# Patient Record
Sex: Male | Born: 1950 | Race: Black or African American | Hispanic: No | Smoking: Former smoker
Health system: Southern US, Community
[De-identification: ages and names within clinical notes are randomized; demographics above are authoritative.]

## PROBLEM LIST (undated history)

## (undated) DIAGNOSIS — I4891 Unspecified atrial fibrillation: Secondary | ICD-10-CM

## (undated) DIAGNOSIS — I639 Cerebral infarction, unspecified: Secondary | ICD-10-CM

## (undated) DIAGNOSIS — I10 Essential (primary) hypertension: Secondary | ICD-10-CM

## (undated) DIAGNOSIS — I2510 Atherosclerotic heart disease of native coronary artery without angina pectoris: Secondary | ICD-10-CM

## (undated) DIAGNOSIS — I5022 Chronic systolic (congestive) heart failure: Secondary | ICD-10-CM

## (undated) DIAGNOSIS — I509 Heart failure, unspecified: Secondary | ICD-10-CM

## (undated) DIAGNOSIS — R12 Heartburn: Secondary | ICD-10-CM

## (undated) DIAGNOSIS — F1910 Other psychoactive substance abuse, uncomplicated: Secondary | ICD-10-CM

## (undated) DIAGNOSIS — J449 Chronic obstructive pulmonary disease, unspecified: Secondary | ICD-10-CM

## (undated) HISTORY — PX: JOINT REPLACEMENT: SHX530

---

## 1999-01-07 ENCOUNTER — Encounter: Payer: Self-pay | Admitting: Emergency Medicine

## 1999-01-07 ENCOUNTER — Inpatient Hospital Stay (HOSPITAL_COMMUNITY): Admission: EM | Admit: 1999-01-07 | Discharge: 1999-01-09 | Payer: Self-pay

## 1999-01-08 ENCOUNTER — Encounter: Payer: Self-pay | Admitting: Surgery

## 1999-07-21 ENCOUNTER — Emergency Department (HOSPITAL_COMMUNITY): Admission: EM | Admit: 1999-07-21 | Discharge: 1999-07-21 | Payer: Self-pay | Admitting: Emergency Medicine

## 1999-07-21 ENCOUNTER — Encounter: Payer: Self-pay | Admitting: Emergency Medicine

## 1999-11-07 ENCOUNTER — Encounter: Payer: Self-pay | Admitting: Emergency Medicine

## 1999-11-07 ENCOUNTER — Inpatient Hospital Stay (HOSPITAL_COMMUNITY): Admission: EM | Admit: 1999-11-07 | Discharge: 1999-11-11 | Payer: Self-pay | Admitting: Emergency Medicine

## 1999-11-07 ENCOUNTER — Encounter: Payer: Self-pay | Admitting: Orthopaedic Surgery

## 2003-05-19 ENCOUNTER — Ambulatory Visit (HOSPITAL_COMMUNITY): Admission: RE | Admit: 2003-05-19 | Discharge: 2003-05-19 | Payer: Self-pay | Admitting: Internal Medicine

## 2007-08-21 ENCOUNTER — Inpatient Hospital Stay (HOSPITAL_COMMUNITY): Admission: EM | Admit: 2007-08-21 | Discharge: 2007-08-24 | Payer: Self-pay | Admitting: Emergency Medicine

## 2007-08-22 ENCOUNTER — Encounter (INDEPENDENT_AMBULATORY_CARE_PROVIDER_SITE_OTHER): Payer: Self-pay | Admitting: Internal Medicine

## 2007-08-24 ENCOUNTER — Other Ambulatory Visit: Payer: Self-pay | Admitting: Cardiovascular Disease

## 2008-06-24 ENCOUNTER — Inpatient Hospital Stay (HOSPITAL_COMMUNITY): Admission: EM | Admit: 2008-06-24 | Discharge: 2008-06-26 | Payer: Self-pay | Admitting: Emergency Medicine

## 2008-06-24 ENCOUNTER — Ambulatory Visit: Payer: Self-pay | Admitting: Family Medicine

## 2009-02-06 ENCOUNTER — Inpatient Hospital Stay (HOSPITAL_COMMUNITY): Admission: EM | Admit: 2009-02-06 | Discharge: 2009-02-14 | Payer: Self-pay | Admitting: Emergency Medicine

## 2009-02-06 ENCOUNTER — Ambulatory Visit: Payer: Self-pay | Admitting: Cardiology

## 2009-02-06 ENCOUNTER — Ambulatory Visit: Payer: Self-pay | Admitting: Internal Medicine

## 2009-02-07 ENCOUNTER — Encounter (INDEPENDENT_AMBULATORY_CARE_PROVIDER_SITE_OTHER): Payer: Self-pay | Admitting: Internal Medicine

## 2009-02-11 ENCOUNTER — Encounter: Payer: Self-pay | Admitting: Internal Medicine

## 2009-02-20 ENCOUNTER — Encounter: Payer: Self-pay | Admitting: Cardiology

## 2009-03-13 ENCOUNTER — Inpatient Hospital Stay (HOSPITAL_COMMUNITY)
Admission: EM | Admit: 2009-03-13 | Discharge: 2009-03-17 | Payer: Self-pay | Admitting: Physical Medicine and Rehabilitation

## 2009-03-13 ENCOUNTER — Ambulatory Visit: Payer: Self-pay | Admitting: Pulmonary Disease

## 2009-03-13 ENCOUNTER — Ambulatory Visit: Payer: Self-pay | Admitting: Internal Medicine

## 2009-03-14 ENCOUNTER — Encounter: Payer: Self-pay | Admitting: Internal Medicine

## 2009-03-28 ENCOUNTER — Encounter: Payer: Self-pay | Admitting: Cardiology

## 2010-03-05 ENCOUNTER — Ambulatory Visit: Payer: Self-pay | Admitting: Emergency Medicine

## 2010-03-06 ENCOUNTER — Ambulatory Visit: Payer: Self-pay | Admitting: Internal Medicine

## 2010-03-07 ENCOUNTER — Encounter (INDEPENDENT_AMBULATORY_CARE_PROVIDER_SITE_OTHER): Payer: Self-pay | Admitting: Pulmonary Disease

## 2010-03-25 DIAGNOSIS — I2510 Atherosclerotic heart disease of native coronary artery without angina pectoris: Secondary | ICD-10-CM | POA: Insufficient documentation

## 2010-03-25 DIAGNOSIS — I10 Essential (primary) hypertension: Secondary | ICD-10-CM | POA: Insufficient documentation

## 2010-03-25 DIAGNOSIS — F1910 Other psychoactive substance abuse, uncomplicated: Secondary | ICD-10-CM

## 2010-04-17 ENCOUNTER — Inpatient Hospital Stay (HOSPITAL_COMMUNITY): Admission: EM | Admit: 2010-04-17 | Discharge: 2010-03-10 | Payer: Self-pay | Admitting: Emergency Medicine

## 2010-05-18 ENCOUNTER — Inpatient Hospital Stay (HOSPITAL_COMMUNITY)
Admission: EM | Admit: 2010-05-18 | Discharge: 2010-05-24 | Payer: Self-pay | Source: Home / Self Care | Attending: Internal Medicine | Admitting: Internal Medicine

## 2010-05-19 ENCOUNTER — Encounter (INDEPENDENT_AMBULATORY_CARE_PROVIDER_SITE_OTHER): Payer: Self-pay | Admitting: Internal Medicine

## 2010-05-26 LAB — DIFFERENTIAL
Basophils Absolute: 0.1 10*3/uL (ref 0.0–0.1)
Basophils Relative: 1 % (ref 0–1)
Eosinophils Absolute: 0.1 10*3/uL (ref 0.0–0.7)
Eosinophils Relative: 1 % (ref 0–5)
Lymphocytes Relative: 57 % — ABNORMAL HIGH (ref 12–46)
Lymphs Abs: 6.8 10*3/uL — ABNORMAL HIGH (ref 0.7–4.0)
Monocytes Absolute: 0.8 10*3/uL (ref 0.1–1.0)
Monocytes Relative: 7 % (ref 3–12)
Neutro Abs: 4 10*3/uL (ref 1.7–7.7)
Neutrophils Relative %: 34 % — ABNORMAL LOW (ref 43–77)

## 2010-05-26 LAB — CBC
HCT: 46.6 % (ref 39.0–52.0)
HCT: 40.7 % (ref 39.0–52.0)
HCT: 41.7 % (ref 39.0–52.0)
HCT: 41.4 % (ref 39.0–52.0)
HCT: 43.4 % (ref 39.0–52.0)
Hemoglobin: 15.2 g/dL (ref 13.0–17.0)
Hemoglobin: 13.2 g/dL (ref 13.0–17.0)
Hemoglobin: 13.8 g/dL (ref 13.0–17.0)
Hemoglobin: 13.7 g/dL (ref 13.0–17.0)
Hemoglobin: 14.1 g/dL (ref 13.0–17.0)
MCH: 28.5 pg (ref 26.0–34.0)
MCH: 27.3 pg (ref 26.0–34.0)
MCH: 28.1 pg (ref 26.0–34.0)
MCH: 28 pg (ref 26.0–34.0)
MCH: 27.5 pg (ref 26.0–34.0)
MCHC: 32.6 g/dL (ref 30.0–36.0)
MCHC: 32.4 g/dL (ref 30.0–36.0)
MCHC: 33.1 g/dL (ref 30.0–36.0)
MCHC: 33.1 g/dL (ref 30.0–36.0)
MCHC: 32.5 g/dL (ref 30.0–36.0)
MCV: 87.4 fL (ref 78.0–100.0)
MCV: 84.3 fL (ref 78.0–100.0)
MCV: 84.9 fL (ref 78.0–100.0)
MCV: 84.7 fL (ref 78.0–100.0)
MCV: 84.6 fL (ref 78.0–100.0)
Platelets: 252 10*3/uL (ref 150–400)
Platelets: 220 10*3/uL (ref 150–400)
Platelets: 217 10*3/uL (ref 150–400)
Platelets: 225 10*3/uL (ref 150–400)
Platelets: 226 10*3/uL (ref 150–400)
RBC: 5.33 MIL/uL (ref 4.22–5.81)
RBC: 4.83 MIL/uL (ref 4.22–5.81)
RBC: 4.91 MIL/uL (ref 4.22–5.81)
RBC: 4.89 MIL/uL (ref 4.22–5.81)
RBC: 5.13 MIL/uL (ref 4.22–5.81)
RDW: 14.9 % (ref 11.5–15.5)
RDW: 14.6 % (ref 11.5–15.5)
RDW: 15.1 % (ref 11.5–15.5)
RDW: 14.9 % (ref 11.5–15.5)
RDW: 14.9 % (ref 11.5–15.5)
WBC: 11.8 10*3/uL — ABNORMAL HIGH (ref 4.0–10.5)
WBC: 8.9 10*3/uL (ref 4.0–10.5)
WBC: 6.4 10*3/uL (ref 4.0–10.5)
WBC: 5.8 10*3/uL (ref 4.0–10.5)
WBC: 4.6 10*3/uL (ref 4.0–10.5)

## 2010-05-26 LAB — ALDOSTERONE: Aldosterone, Serum: 13 ng/dL

## 2010-05-26 LAB — RENAL FUNCTION PANEL
Albumin: 3.2 g/dL — ABNORMAL LOW (ref 3.5–5.2)
BUN: 17 mg/dL (ref 6–23)
CO2: 27 mEq/L (ref 19–32)
Calcium: 9 mg/dL (ref 8.4–10.5)
Chloride: 104 mEq/L (ref 96–112)
Creatinine, Ser: 1.22 mg/dL (ref 0.4–1.5)
GFR calc Af Amer: >60 mL/min (ref >60)
GFR calc non Af Amer: >60 mL/min (ref >60)
Glucose, Bld: 90 mg/dL (ref 70–99)
Phosphorus: 3.9 mg/dL (ref 2.3–4.6)
Potassium: 3.9 mEq/L (ref 3.5–5.1)
Sodium: 139 mEq/L (ref 135–145)

## 2010-05-26 LAB — GLUCOSE, CAPILLARY
Glucose-Capillary: 101 mg/dL — ABNORMAL HIGH (ref 70–99)
Glucose-Capillary: 170 mg/dL — ABNORMAL HIGH (ref 70–99)
Glucose-Capillary: 119 mg/dL — ABNORMAL HIGH (ref 70–99)
Glucose-Capillary: 126 mg/dL — ABNORMAL HIGH (ref 70–99)
Glucose-Capillary: 101 mg/dL — ABNORMAL HIGH (ref 70–99)
Glucose-Capillary: 119 mg/dL — ABNORMAL HIGH (ref 70–99)
Glucose-Capillary: 129 mg/dL — ABNORMAL HIGH (ref 70–99)
Glucose-Capillary: 100 mg/dL — ABNORMAL HIGH (ref 70–99)
Glucose-Capillary: 126 mg/dL — ABNORMAL HIGH (ref 70–99)
Glucose-Capillary: 99 mg/dL (ref 70–99)
Glucose-Capillary: 89 mg/dL (ref 70–99)
Glucose-Capillary: 113 mg/dL — ABNORMAL HIGH (ref 70–99)
Glucose-Capillary: 91 mg/dL (ref 70–99)
Glucose-Capillary: 116 mg/dL — ABNORMAL HIGH (ref 70–99)
Glucose-Capillary: 116 mg/dL — ABNORMAL HIGH (ref 70–99)
Glucose-Capillary: 99 mg/dL (ref 70–99)
Glucose-Capillary: 88 mg/dL (ref 70–99)
Glucose-Capillary: 86 mg/dL (ref 70–99)
Glucose-Capillary: 115 mg/dL — ABNORMAL HIGH (ref 70–99)
Glucose-Capillary: 89 mg/dL (ref 70–99)

## 2010-05-26 LAB — COMPREHENSIVE METABOLIC PANEL
ALT: 31 U/L (ref 0–53)
ALT: 30 U/L (ref 0–53)
AST: 43 U/L — ABNORMAL HIGH (ref 0–37)
AST: 45 U/L — ABNORMAL HIGH (ref 0–37)
Albumin: 4 g/dL (ref 3.5–5.2)
Albumin: 3.1 g/dL — ABNORMAL LOW (ref 3.5–5.2)
Alkaline Phosphatase: 120 U/L — ABNORMAL HIGH (ref 39–117)
Alkaline Phosphatase: 77 U/L (ref 39–117)
BUN: 11 mg/dL (ref 6–23)
BUN: 13 mg/dL (ref 6–23)
CO2: 22 mEq/L (ref 19–32)
CO2: 25 mEq/L (ref 19–32)
Calcium: 9.2 mg/dL (ref 8.4–10.5)
Calcium: 8.8 mg/dL (ref 8.4–10.5)
Chloride: 104 mEq/L (ref 96–112)
Chloride: 107 mEq/L (ref 96–112)
Creatinine, Ser: 1.24 mg/dL (ref 0.4–1.5)
Creatinine, Ser: 1.06 mg/dL (ref 0.4–1.5)
GFR calc Af Amer: >60 mL/min (ref >60)
GFR calc Af Amer: >60 mL/min (ref >60)
GFR calc non Af Amer: >60 mL/min (ref >60)
GFR calc non Af Amer: >60 mL/min (ref >60)
Glucose, Bld: 213 mg/dL — ABNORMAL HIGH (ref 70–99)
Glucose, Bld: 99 mg/dL (ref 70–99)
Potassium: 3 mEq/L — ABNORMAL LOW (ref 3.5–5.1)
Potassium: 4.6 mEq/L (ref 3.5–5.1)
Sodium: 140 mEq/L (ref 135–145)
Sodium: 140 mEq/L (ref 135–145)
Total Bilirubin: 0.3 mg/dL (ref 0.3–1.2)
Total Bilirubin: 0.3 mg/dL (ref 0.3–1.2)
Total Protein: 7.2 g/dL (ref 6.0–8.3)
Total Protein: 6.2 g/dL (ref 6.0–8.3)

## 2010-05-26 LAB — BASIC METABOLIC PANEL
BUN: 15 mg/dL (ref 6–23)
BUN: 15 mg/dL (ref 6–23)
BUN: 15 mg/dL (ref 6–23)
CO2: 26 mEq/L (ref 19–32)
CO2: 25 mEq/L (ref 19–32)
CO2: 24 mEq/L (ref 19–32)
Calcium: 8.9 mg/dL (ref 8.4–10.5)
Calcium: 8.9 mg/dL (ref 8.4–10.5)
Calcium: 9.2 mg/dL (ref 8.4–10.5)
Chloride: 106 mEq/L (ref 96–112)
Chloride: 107 mEq/L (ref 96–112)
Chloride: 105 mEq/L (ref 96–112)
Creatinine, Ser: 1.12 mg/dL (ref 0.4–1.5)
Creatinine, Ser: 1.09 mg/dL (ref 0.4–1.5)
Creatinine, Ser: 1.06 mg/dL (ref 0.4–1.5)
GFR calc Af Amer: >60 mL/min (ref >60)
GFR calc Af Amer: >60 mL/min (ref >60)
GFR calc Af Amer: >60 mL/min (ref >60)
GFR calc non Af Amer: >60 mL/min (ref >60)
GFR calc non Af Amer: >60 mL/min (ref >60)
GFR calc non Af Amer: >60 mL/min (ref >60)
Glucose, Bld: 100 mg/dL — ABNORMAL HIGH (ref 70–99)
Glucose, Bld: 87 mg/dL (ref 70–99)
Glucose, Bld: 93 mg/dL (ref 70–99)
Potassium: 4 mEq/L (ref 3.5–5.1)
Potassium: 4.5 mEq/L (ref 3.5–5.1)
Potassium: 4.6 mEq/L (ref 3.5–5.1)
Sodium: 139 mEq/L (ref 135–145)
Sodium: 139 mEq/L (ref 135–145)
Sodium: 137 mEq/L (ref 135–145)

## 2010-05-26 LAB — POCT I-STAT 3, ART BLOOD GAS (G3+)
Acid-base deficit: 8 mmol/L — ABNORMAL HIGH (ref 0.0–2.0)
Acid-base deficit: 3 mmol/L — ABNORMAL HIGH (ref 0.0–2.0)
Bicarbonate: 22.4 mEq/L (ref 20.0–24.0)
Bicarbonate: 23.6 mEq/L (ref 20.0–24.0)
O2 Saturation: 95 %
O2 Saturation: 93 %
Patient temperature: 36.5
TCO2: 24 mmol/L (ref 0–100)
TCO2: 25 mmol/L (ref 0–100)
pCO2 arterial: 66.4 mmHg (ref 35.0–45.0)
pCO2 arterial: 47.2 mmHg — ABNORMAL HIGH (ref 35.0–45.0)
pH, Arterial: 7.137 — CL (ref 7.350–7.450)
pH, Arterial: 7.305 — ABNORMAL LOW (ref 7.350–7.450)
pO2, Arterial: 100 mmHg (ref 80.0–100.0)
pO2, Arterial: 72 mmHg — ABNORMAL LOW (ref 80.0–100.0)

## 2010-05-26 LAB — LIPASE, BLOOD: Lipase: 19 U/L (ref 11–59)

## 2010-05-26 LAB — CK TOTAL AND CKMB (NOT AT ARMC)
CK, MB: 5.2 ng/mL — ABNORMAL HIGH (ref 0.3–4.0)
CK, MB: 7 ng/mL (ref 0.3–4.0)
CK, MB: 7.8 ng/mL (ref 0.3–4.0)
Relative Index: 0.7 (ref 0.0–2.5)
Relative Index: 0.8 (ref 0.0–2.5)
Relative Index: 0.9 (ref 0.0–2.5)
Total CK: 1022 U/L — ABNORMAL HIGH (ref 7–232)
Total CK: 1042 U/L — ABNORMAL HIGH (ref 7–232)
Total CK: 587 U/L — ABNORMAL HIGH (ref 7–232)

## 2010-05-26 LAB — POCT I-STAT, CHEM 8
BUN: 14 mg/dL (ref 6–23)
Calcium, Ion: 1.24 mmol/L (ref 1.12–1.32)
Chloride: 105 mEq/L (ref 96–112)
Creatinine, Ser: 1.2 mg/dL (ref 0.4–1.5)
Glucose, Bld: 210 mg/dL — ABNORMAL HIGH (ref 70–99)
HCT: 51 % (ref 39.0–52.0)
Hemoglobin: 17.3 g/dL — ABNORMAL HIGH (ref 13.0–17.0)
Potassium: 2.9 mEq/L — ABNORMAL LOW (ref 3.5–5.1)
Sodium: 141 mEq/L (ref 135–145)
TCO2: 25 mmol/L (ref 0–100)

## 2010-05-26 LAB — MRSA PCR SCREENING: MRSA by PCR: NEGATIVE

## 2010-05-26 LAB — BRAIN NATRIURETIC PEPTIDE
Pro B Natriuretic peptide (BNP): 77 pg/mL (ref 0.0–100.0)
Pro B Natriuretic peptide (BNP): 269 pg/mL — ABNORMAL HIGH (ref 0.0–100.0)
Pro B Natriuretic peptide (BNP): 728 pg/mL — ABNORMAL HIGH (ref 0.0–100.0)
Pro B Natriuretic peptide (BNP): 192 pg/mL — ABNORMAL HIGH (ref 0.0–100.0)

## 2010-05-26 LAB — RAPID URINE DRUG SCREEN, HOSP PERFORMED
Amphetamines: NOT DETECTED
Barbiturates: NOT DETECTED
Benzodiazepines: NOT DETECTED
Cocaine: NOT DETECTED
Opiates: NOT DETECTED
Tetrahydrocannabinol: NOT DETECTED

## 2010-05-26 LAB — LIPID PANEL
Cholesterol: 206 mg/dL — ABNORMAL HIGH (ref 0–200)
HDL: 82 mg/dL (ref >39)
LDL Cholesterol: 108 mg/dL — ABNORMAL HIGH (ref 0–99)
Total CHOL/HDL Ratio: 2.5 RATIO
Triglycerides: 81 mg/dL (ref <150)
VLDL: 16 mg/dL (ref 0–40)

## 2010-05-26 LAB — HEPATIC FUNCTION PANEL
ALT: 33 U/L (ref 0–53)
AST: 50 U/L — ABNORMAL HIGH (ref 0–37)
Albumin: 3.5 g/dL (ref 3.5–5.2)
Alkaline Phosphatase: 87 U/L (ref 39–117)
Bilirubin, Direct: <0.1 mg/dL (ref 0.0–0.3)
Total Bilirubin: 0.3 mg/dL (ref 0.3–1.2)
Total Protein: 6.3 g/dL (ref 6.0–8.3)

## 2010-05-26 LAB — POCT CARDIAC MARKERS
CKMB, poc: 1.1 ng/mL (ref 1.0–8.0)
Myoglobin, poc: 165 ng/mL (ref 12–200)
Troponin i, poc: <0.05 ng/mL (ref 0.00–0.09)

## 2010-05-26 LAB — PROTIME-INR
INR: 0.9 (ref 0.00–1.49)
Prothrombin Time: 12.4 seconds (ref 11.6–15.2)

## 2010-05-26 LAB — ETHANOL: Alcohol, Ethyl (B): <5 mg/dL (ref 0–10)

## 2010-05-26 LAB — TSH: TSH: 1.178 u[IU]/mL (ref 0.350–4.500)

## 2010-05-26 LAB — HEMOGLOBIN A1C
Hgb A1c MFr Bld: 5.9 % — ABNORMAL HIGH (ref <5.7)
Mean Plasma Glucose: 123 mg/dL — ABNORMAL HIGH (ref <117)

## 2010-05-26 LAB — TROPONIN I
Troponin I: 0.11 ng/mL — ABNORMAL HIGH (ref 0.00–0.06)
Troponin I: 0.12 ng/mL — ABNORMAL HIGH (ref 0.00–0.06)
Troponin I: 0.17 ng/mL — ABNORMAL HIGH (ref 0.00–0.06)

## 2010-05-26 LAB — CORTISOL-AM, BLOOD: Cortisol - AM: 7.6 ug/dL (ref 4.3–22.4)

## 2010-05-26 LAB — CK: Total CK: 394 U/L — ABNORMAL HIGH (ref 7–232)

## 2010-05-26 LAB — PATHOLOGIST SMEAR REVIEW

## 2010-05-26 LAB — MAGNESIUM: Magnesium: 1.8 mg/dL (ref 1.5–2.5)

## 2010-06-02 LAB — RENIN: Renin Activity: 0.33 ng/mL/h (ref 0.25–5.82)

## 2010-06-05 NOTE — Discharge Summary (Addendum)
[name], [name]              ACCOUNT NO.:  [id number]  MEDICAL RECORD NO.:  [id number]          PATIENT TYPE:  INP  LOCATION:  2004                         FACILITY:  [facility]  PHYSICIAN:  Kela Millin, M.D.DATE OF BIRTH:  1950/10/30  DATE OF ADMISSION:  05/18/2010 DATE OF DISCHARGE:  05/24/2010                        DISCHARGE SUMMARY - REFERRING   DISCHARGE DIAGNOSES: 1. Acute on chronic systolic heart failure - ejection fraction 20-25%     per echocardiogram of May 19, 2010. 2. Nonischemic cardiomyopathy. 3. Hypertension. 4. Chronic obstructive pulmonary disease. 5. History of polysubstance abuse. 6. History of left femur fracture, status post surgical repair. 7. Noncompliance.  STUDIES/PROCEDURES: 1. Chest x-ray on January 8th:  Relatively diffuse airspace disease.     This is a multifocal infection or alveolar pulmonary edema.  2. Follow-up chest x-ray on January 9th:  Pulmonary edema. 3. Abdominal ultrasound on January 9th.  Negative abdominal     ultrasound. 4. Follow-up chest x-ray on January 10th:  Near-complete clearing of     previously demonstrated pulmonary edema. Underlying emphysematous     changes and scarring noted. 5. Left shoulder x-ray on January 10th:  Moderate acromioclavicular     degenerative disease, otherwise negative. 6. Nuclear medicine stress test on January 11th:  Findings suggestive     of ischemic cardiomyopathy with marked LV systolic dysfunction.     Ejection fraction 63%.  The left ventricle shows global hypokinesis     and a apical akinesis. 7. 2-D echocardiogram:  Ejection fraction 20-25%, diffuse hypokinesis. 8. Cardiac catheterization done on January 13th:  Diffuse moderate     nonobstructive coronary artery disease. Known severe left     ventricular dysfunction.  CONSULTATIONS:  Cardiology - [facility]/Dr. Shirlee Latch.  BRIEF HISTORY:  The patient is a 60 year old black male with the above- listed medical problems who presented  with shortness of breath.  He also complained of chest pain and some abdominal pain.  In the ED, he was found to have a blood pressure of 240/130 and was started on IV nitroglycerin, also given IV Lasix and placed on BiPAP.  His blood pressure improved to a systolic of 120 with the above intervention.  The patient reported that he had not been taking his medications for a few days, although per the admitting MD, his medication bottles were all empty and the last time he had filled them was October 31st, with no refills on those.  The patient admitted to chest pain that was pressure- like radiating to his left arm.  He also admitted to epigastric pain that was pressure-like.  He denied nausea, vomiting, diarrhea.  He admitted to a cough productive of sputum.  He was admitted for further evaluation and management.  HOSPITAL COURSE: 1. Acute on chronic systolic heart failure:  Upon admission the     patient was placed on BiPAP and diuresed with IV Lasix.  Cardiac     enzymes were cycled and mild troponin elevation of 0.017 and 0.11     were noted.  The patient was placed on IV nitroglycerin as well     given his markedly elevated blood pressure/malignant hypertension  upon admission.  Cardiology was consulted and they followed and     added Bystolic.  With diuresis his symptoms improved and he was     subsequently changed to p.o. Lasix.  A 2-D echocardiogram was done     on January 9th which revealed an ejection fraction of 20-25% with     diffuse hypokinesis and mild concentric hypertrophy was noted.  The     patient was placed on an ACE inhibitor, lisinopril as well during     his hospital stay.  The patient has continued to improve and his     shortness of breath has completely resolved. He has had no     peripheral edema.  Cardiology followed up with the patient today     and from their standpoint recommended discharge for outpatient     followup with Dr. Jens Som. 2. Diffuse  nonobstructive coronary artery disease.  As above, the     patient presented with chest pain. Cardiac enzymes were cycled and     came back with mildly elevated troponins.  The patient had a stress     test and it showed findings suggestive of ischemic cardiomyopathy     with marked LV systolic dysfunction, ejection fraction of 23%.     Cardiology followed up with the patient and a cardiac     catheterization was done on May 23, 2009 by Dr. Excell Seltzer.  This     showed diffuse moderate nonobstructive coronary artery disease, and     known severe left ventricular dysfunction.  Following this, Dr.     Earmon Phoenix impression was that the patient has severe cardiomyopathy     out of proportion to the extent of coronary artery disease.  He     recommended continuing ongoing medical therapy.  The patient was     maintained on aspirin as well as ACE inhibitor and Bystolic while     in the hospital.  He has remained chest pain-free. He will be     discharged on medical therapy as above and to follow up with Dr.     Jens Som. 3. COPD:  The patient was placed on nebulized bronchodilators as well     as antibiotics during his hospital stay and he is to complete the     antibiotics upon discharge and continue his bronchodilators, and     follow up with his PCP. 4. Malignant hypertension:  Treated with IV nitroglycerin on     admission.  The patient responded well to this as well as the     diuresis with IV Lasix.  Subsequently the nitroglycerin drip was     discontinued and his blood pressure has been maintained on the     Bystolic and  lisinopril which he is to continue upon discharge. 5. History of polysubstance abuse:  The patient had a urine drug     screen done on admission and this was negative. 6. Hyperlipidema:  The patient is to continue his Zocor upon     discharge.  DISCHARGE MEDICATIONS: 1. Bystolic 25 mg p.o. daily. 2. Combivent 2 puffs q.i.d. 3. Avelox 400 mg p.o. daily. 4.  Lisinopril 10 mg p.o. b.i.d. 5. Aspirin 325 mg p.o. daily. 6. Lasix 20 mg p.o. daily. 7. Prilosec 20 mg p.o. daily. 8. Zocor 40 mg p.o. q.h.s. 9. Spironolactone 25 mg p.o. daily.  FOLLOWUP CARE: 1. Dr. Concepcion Elk with [facility] on February 9th at 9:00 a.m. 2. Dr. Jens Som in 2 weeks, the patient  is to call for an appointment     upon discharge.  DISCHARGE CONDITION:  Improved/stable.     Kela Millin, M.D.     ACV/MEDQ  D:  05/24/2010  T:  05/24/2010  Job:  [id number]  cc:   Fleet Contras, M.D. Madolyn Frieze Jens Som, MD, [facility]  Electronically Signed by Donnalee Curry M.D. on 06/05/2010 08:31:01 AM

## 2010-06-10 NOTE — Procedures (Signed)
[name], [name]              ACCOUNT NO.:  [id number]  MEDICAL RECORD NO.:  [id number]          PATIENT TYPE:  INP  LOCATION:  2004                         FACILITY:  [facility]  PHYSICIAN:  Veverly Fells. Excell Seltzer, MD  DATE OF BIRTH:  1950-11-27  DATE OF PROCEDURE:  05/23/2010 DATE OF DISCHARGE:                           CARDIAC CATHETERIZATION   PROCEDURES: 1. Left heart catheterization. 2. Selective coronary angiography.  PROCEDURAL INDICATIONS:  [name] is a 60 year old gentleman with severe nonischemic cardiomyopathy.  He presented with heart failure. His cardiac markers were abnormal and he had a Myoview stress test that showed ischemia.  He was referred for cardiac catheterization.  Risks and indications of procedure were reviewed with the patient. Informed consent was obtained.  The right groin was prepped, draped, and anesthetized with 1% lidocaine using modified Seldinger technique.  A 5- French sheath was placed in the right femoral artery.  Standard Judkins catheters were used for coronary angiography.  A pigtail catheter was used to record left ventricular pressure.  Catheter exchanges were performed over a guidewire.  Pullback gradient across the aortic valve was measured.  Ventriculography was deferred because the patient has had other cardiac imaging of LV function.  He tolerated the procedure well. There were no immediate complications.  FINDINGS:  Aortic pressure 120/77 with a mean of 98, left ventricular pressure 127/25.  Left mainstem:  There is mild-to-moderate calcification of the left main.  The left main is widely patent and divides into the LAD and left circumflex.  LAD:  The LAD is heavily calcified throughout the proximal and midportions.  The proximal LAD is patent at the origin of the first septal perforator.  There is a diffuse 50% lesion through that area of the LAD.  This also involves the origins of the first two diagonal branches.  Both  diagonals are patent throughout their course.  Further down in the mid and distal LAD, there is irregularity and diffuse plaquing without high-grade stenosis.  Left circumflex:  The first OM is very large and has no significant obstructive disease.  The OM does have diffuse luminal irregularities. The AV groove circumflex is much smaller and supplies small OM-2 and 3 branches, both of which have diffuse disease without critical obstruction.  Right coronary artery.  The RCA is a large, dominant vessel.  There is diffuse plaquing throughout the proximal, mid, and distal vessel without high-grade obstruction.  The PDA also has diffuse disease.  The distal RCA before the origin of the PDA has a 50% to 70% stenosis.  The PDA has scattered 50% stenoses.  The posterolateral branch, which is very small, has a subtotal occlusion.  FINAL ASSESSMENT: 1. Diffuse, moderate nonobstructive coronary artery disease as     outlined above. 2. Known severe left ventricular dysfunction.  RECOMMENDATIONS:  The patient has a severe cardiomyopathy out of proportion to the extent of his coronary artery disease.  I would recommend ongoing medical therapy.     Veverly Fells. Excell Seltzer, MD     MDC/MEDQ  D:  05/23/2010  T:  05/24/2010  Job:  [id number]  Electronically Signed by Tonny Bollman MD on  06/10/2010 04:50:46 AM

## 2010-06-19 NOTE — Consult Note (Signed)
[name], [name]              ACCOUNT NO.:  [id number]  MEDICAL RECORD NO.:  [id number]           PATIENT TYPE:  LOCATION:                                 FACILITY:  PHYSICIAN:  Marca Ancona, MD      DATE OF BIRTH:  Sep 30, 1950  DATE OF CONSULTATION: DATE OF DISCHARGE:                                CONSULTATION   DATE OF CONSULTATION:  May 19, 2010.  PRIMARY CARE PHYSICIAN:  None.  PRIMARY CARDIOLOGIST:  Madolyn Frieze. Jens Som, MD, [facility] (has never seen in the office).  CHIEF COMPLAINT:  Chest pain and heart failure.  HISTORY OF PRESENT ILLNESS:  [name] is a 61 year old male with a history of nonobstructive coronary artery disease and nonischemic cardiomyopathy.  According to the patient, he has been out of his medications for the last 2 weeks.  Per the admitting physician, Dr. Judie Petit, his bottles were dated March 10, 2010, with no refills and were all empty.  The patient states that for about the last 2 weeks, he has noted increasing abdominal fullness in his upper abdomen, which he said became very firm and tender.  He also notes dyspnea on exertion, PND, some orthopnea and lower extremity edema.  He has had left lateral chest pain that is worse with deep inspiration and radiated up into his shoulder.  He has difficulty raising his left arm.  The pain has reached an 8/10.  The pain gets better with Tylenol.  Since admission, on IV Lasix, he has noted decrease in edema and his abdominal symptoms have improved as well.  He states he has not used cocaine since November and is not smoking very much and not drinking very much, at least partly because of cost.  Currently, he feels his condition is much improved from admission.  PAST MEDICAL HISTORY: 1. Status post cardiac catheterization in October 2010 showing     multiple 50% and 70% lesions in the LAD, circumflex patent, PL of     the circumflex 50%, RCA 50% and 70% stenosis, and EF 15% to 20%. 2.  Nonischemic cardiomyopathy with an EF of 25% by echocardiogram in     October 2011, apical akinesis noted and CVP 10. 3. History of polysubstance abuse. 4. Hypertension. 5. Ongoing tobacco use. 6. Chronic systolic CHF. 7. Hyperlipidemia. 8. COPD.  SURGICAL HISTORY:  He is status post cardiac catheterizations as well as left femur ORIF after trauma.  ALLERGIES:  No known drug allergies.  CURRENT MEDICATIONS: 1. Ventolin inhaler q.6 h. 2. Aspirin 325 mg a day. 3. Lasix 40 mg IV daily. 4. Heparin 5000 units q.8 h. 5. Lisinopril 10 mg b.i.d. 6. Avelox 400 mg IV daily. 7. Potassium p.r.n.  SOCIAL HISTORY:  He lives in a [place] apartment alone.  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  He has a greater than 30-pack year history of tobacco use, but states he is smoking less than half a pack a day.  He states he had a few shots of liquor over the holidays and drinks an occasional beer.  He also states he has not used cocaine  since November.  FAMILY HISTORY:  His mother died in her 33s with a stroke, but no heart disease; and his father died in his 58s with no coronary artery disease known.  No siblings have heart disease known.  REVIEW OF SYSTEMS:  He has the arthralgias and joint pains.  He denies reflux symptoms or melena.  He has no true exertional chest pain.  He has a cough that today has become productive with brownish sputum.  He is not currently wheezing.  Full 14-point review of systems is otherwise negative except as stated in the HPI.  PHYSICAL EXAMINATION:  VITAL SIGNS:  Temperature is 98.1, blood pressure 133/93, heart rate 73, respiratory rate 14, and O2 saturation 99% on 2 L. GENERAL:  He is a slender African American male in no acute distress, on O2 and at rest. HEENT:  Normal. NECK:  There is no lymphadenopathy, thyromegaly or bruits noted, and JVD is minimal at approximately 8 cm. CARDIOVASCULAR:  His heart is regular in rate and rhythm  with an S1 and S2, and no significant murmur, rub, or gallop is noted.  Distal pulses are intact in all four extremities with a slightly decreased left DP. LUNGS:  He has bibasilar crackles, right greater than left. SKIN:  No rashes or lesions are noted. ABDOMEN:  Soft and has active bowel sounds.  He has diffuse tenderness without guarding or crepitus noted.  He has liver enlargement, 2 cm below the rib edge. EXTREMITIES:  There is no cyanosis, clubbing, or edema noted. MUSCULOSKELETAL:  There is no joint deformity or effusions and no spine or CVA tenderness. NEUROLOGIC:  He is alert and oriented with cranial nerves II-XII grossly intact.  Chest x-ray shows pulmonary edema.  EKG is sinus tach, rate 138 with inferior T-wave changes that are slightly different from an EKG dated October 2011.  He also has lateral T-wave changes and early repolarization with LVH.  LABORATORY VALUES:  Hemoglobin 13.2, hematocrit 40.7, WBCs 8.9, and platelets 220.  Sodium 140, potassium 4.6, chloride 107, CO2 of 25, BUN 13, creatinine 1.06, glucose 99.  Alkaline phosphatase 77, AST 45, ALT 30.  BNP 728.  Total cholesterol 206, triglycerides 81, HDL 82, LDL 108. Point-of-care markers negative x1.  CK-MB 587/5.2, then 1022/7.8 with a normal index.  Troponin I 0.12 and then 0.17.  IMPRESSION:  [name] was seen today by Dr. Shirlee Latch, the patient evaluated and the data reviewed.  He is a 60 year old male with a history of nonischemic cardiomyopathy, moderate coronary artery disease, smoking, and prior EtOH and drug use who presented with shortness of breath. 1. Dyspnea.  He had pulmonary edema on his chest x-ray, and his BNP     was elevated at 728.  His ejection fraction was 25% in October     2011.  He has been off all medications for greater than 2 weeks.     We suspect volume overload, though his JVP is surprisingly not that     high.  Chronic obstructive pulmonary disease may also contribute to      the dyspnea, and he is being treated for an upper respiratory     infection as well.  We will increase his Lasix to 40 mg IV b.i.d.     He will be continued on the lisinopril.  We will add bisoprolol 2.5     mg a day.  He has been warned not to use cocaine again.  We will     start Atrovent q.6  h. in addition to the albuterol for his chronic     obstructive pulmonary disease. 2. Coronary artery disease:  It was moderate on 2010 cath.  He has a     mildly increased troponin I, a very atypical chest pain, and the CK     is elevated but with a normal index; this is not considered a     primary cardiac elevation in his MB.  We suspect, the elevated     troponin I is secondary to demand ischemia from his heart failure.     We favor a Lexiscan Myoview before discharge, and we will order     this for 48 hours from now.  If his condition is not appropriate to     get the Myoview at that time, it can be rescheduled but should     probably be done before discharge.     Theodore Demark, PA-C   ______________________________ Marca Ancona, MD    RB/MEDQ  D:  05/19/2010  T:  05/20/2010  Job:  [id number]  Electronically Signed by Theodore Demark PA-C on 06/02/2010 11:37:08 AM Electronically Signed by Marca Ancona MD on 06/19/2010 08:34:23 AM

## 2010-07-16 NOTE — Discharge Summary (Addendum)
  [name], [name]              ACCOUNT NO.:  [id number]  MEDICAL RECORD NO.:  [id number]          PATIENT TYPE:  INP  LOCATION:  2004                         FACILITY:  [facility]  PHYSICIAN:  Kela Millin, M.D.DATE OF BIRTH:  [birth year]  DATE OF ADMISSION:  05/18/2010 DATE OF DISCHARGE:  05/24/2010                        DISCHARGE SUMMARY - REFERRING   ADDENDUM  Bisoprolol 2.5 mg p.o. daily (not Bystolic 25 mg p.o. daily as erroneously transcribed on the dictation of May 25, 2010).  The rest of the medications are as dictated on the discharge summary of May 24, 2010, as also the med rec on the patient's chart.     Kela Millin, M.D.     ACV/MEDQ  D:  06/23/2010  T:  06/23/2010  Job:  [id number]  Electronically Signed by Donnalee Curry M.D. on 07/15/2010 05:14:00 PM Electronically Signed by Donnalee Curry M.D. on 07/15/2010 05:14:00 PM Electronically Signed by Donnalee Curry M.D. on 07/15/2010 05:31:54 PM Electronically Signed by Donnalee Curry M.D. on 07/15/2010 05:31:54 PM Electronically Signed by Donnalee Curry M.D. on 07/15/2010 06:14:20 PM Electronically Signed by Donnalee Curry M.D. on 07/15/2010 06:22:02 PM Electronically Signed by Donnalee Curry M.D. on 07/15/2010 07:32:31 PM

## 2010-07-23 LAB — GLUCOSE, CAPILLARY
Glucose-Capillary: 100 mg/dL — ABNORMAL HIGH (ref 70–99)
Glucose-Capillary: 112 mg/dL — ABNORMAL HIGH (ref 70–99)
Glucose-Capillary: 114 mg/dL — ABNORMAL HIGH (ref 70–99)
Glucose-Capillary: 125 mg/dL — ABNORMAL HIGH (ref 70–99)
Glucose-Capillary: 73 mg/dL (ref 70–99)
Glucose-Capillary: 81 mg/dL (ref 70–99)
Glucose-Capillary: 83 mg/dL (ref 70–99)
Glucose-Capillary: 86 mg/dL (ref 70–99)
Glucose-Capillary: 89 mg/dL (ref 70–99)

## 2010-07-23 LAB — CARDIAC PANEL(CRET KIN+CKTOT+MB+TROPI)
CK, MB: 2.6 ng/mL (ref 0.3–4.0)
Relative Index: 1.4 (ref 0.0–2.5)
Total CK: 189 U/L (ref 7–232)

## 2010-07-23 LAB — MAGNESIUM
Magnesium: 2 mg/dL (ref 1.5–2.5)
Magnesium: 2 mg/dL (ref 1.5–2.5)
Magnesium: 2.3 mg/dL (ref 1.5–2.5)

## 2010-07-23 LAB — URINALYSIS, ROUTINE W REFLEX MICROSCOPIC
Leukocytes, UA: NEGATIVE
Nitrite: NEGATIVE
Protein, ur: 30 mg/dL — AB
Specific Gravity, Urine: 1.012 (ref 1.005–1.030)
Urobilinogen, UA: 0.2 mg/dL (ref 0.0–1.0)

## 2010-07-23 LAB — BASIC METABOLIC PANEL
BUN: 12 mg/dL (ref 6–23)
BUN: 8 mg/dL (ref 6–23)
CO2: 24 mEq/L (ref 19–32)
CO2: 26 mEq/L (ref 19–32)
Calcium: 8.5 mg/dL (ref 8.4–10.5)
Calcium: 8.6 mg/dL (ref 8.4–10.5)
Calcium: 8.7 mg/dL (ref 8.4–10.5)
Calcium: 9.2 mg/dL (ref 8.4–10.5)
Chloride: 104 mEq/L (ref 96–112)
Chloride: 105 mEq/L (ref 96–112)
Chloride: 110 mEq/L (ref 96–112)
Creatinine, Ser: 0.88 mg/dL (ref 0.4–1.5)
Creatinine, Ser: 0.93 mg/dL (ref 0.4–1.5)
Creatinine, Ser: 1 mg/dL (ref 0.4–1.5)
GFR calc Af Amer: >60 mL/min (ref >60)
GFR calc Af Amer: >60 mL/min (ref >60)
GFR calc Af Amer: >60 mL/min (ref >60)
GFR calc non Af Amer: >60 mL/min (ref >60)
GFR calc non Af Amer: >60 mL/min (ref >60)
GFR calc non Af Amer: >60 mL/min (ref >60)
Glucose, Bld: 110 mg/dL — ABNORMAL HIGH (ref 70–99)
Potassium: 3.7 mEq/L (ref 3.5–5.1)
Potassium: 4.5 mEq/L (ref 3.5–5.1)
Sodium: 138 mEq/L (ref 135–145)
Sodium: 141 mEq/L (ref 135–145)

## 2010-07-23 LAB — RAPID URINE DRUG SCREEN, HOSP PERFORMED
Benzodiazepines: POSITIVE — AB
Cocaine: POSITIVE — AB
Opiates: NOT DETECTED
Tetrahydrocannabinol: NOT DETECTED

## 2010-07-23 LAB — COMPREHENSIVE METABOLIC PANEL
AST: 39 U/L — ABNORMAL HIGH (ref 0–37)
Albumin: 4.1 g/dL (ref 3.5–5.2)
Chloride: 105 mEq/L (ref 96–112)
Creatinine, Ser: 1.71 mg/dL — ABNORMAL HIGH (ref 0.4–1.5)
GFR calc Af Amer: 50 mL/min — ABNORMAL LOW (ref >60)
Total Bilirubin: 0.2 mg/dL — ABNORMAL LOW (ref 0.3–1.2)
Total Protein: 7.6 g/dL (ref 6.0–8.3)

## 2010-07-23 LAB — TROPONIN I
Troponin I: 0.07 ng/mL — ABNORMAL HIGH (ref 0.00–0.06)
Troponin I: 0.11 ng/mL — ABNORMAL HIGH (ref 0.00–0.06)

## 2010-07-23 LAB — CBC
Hemoglobin: 13.9 g/dL (ref 13.0–17.0)
Hemoglobin: 14.7 g/dL (ref 13.0–17.0)
Hemoglobin: 16.8 g/dL (ref 13.0–17.0)
MCH: 28.2 pg (ref 26.0–34.0)
MCH: 28.3 pg (ref 26.0–34.0)
MCHC: 32.8 g/dL (ref 30.0–36.0)
MCHC: 33.6 g/dL (ref 30.0–36.0)
MCV: 87.9 fL (ref 78.0–100.0)
Platelets: 152 10*3/uL (ref 150–400)
Platelets: 159 10*3/uL (ref 150–400)
Platelets: 159 10*3/uL (ref 150–400)
Platelets: 205 10*3/uL (ref 150–400)
RBC: 4.9 MIL/uL (ref 4.22–5.81)
RBC: 4.96 MIL/uL (ref 4.22–5.81)
RBC: 5.01 MIL/uL (ref 4.22–5.81)
RBC: 5.19 MIL/uL (ref 4.22–5.81)
RBC: 5.77 MIL/uL (ref 4.22–5.81)
RDW: 14.4 % (ref 11.5–15.5)
WBC: 5.4 10*3/uL (ref 4.0–10.5)
WBC: 8.6 10*3/uL (ref 4.0–10.5)

## 2010-07-23 LAB — DIFFERENTIAL
Basophils Absolute: 0 10*3/uL (ref 0.0–0.1)
Basophils Relative: 0 % (ref 0–1)
Eosinophils Absolute: 0.1 10*3/uL (ref 0.0–0.7)
Eosinophils Relative: 1 % (ref 0–5)
Lymphocytes Relative: 50 % — ABNORMAL HIGH (ref 12–46)
Lymphs Abs: 1.3 10*3/uL (ref 0.7–4.0)
Lymphs Abs: 4.3 10*3/uL — ABNORMAL HIGH (ref 0.7–4.0)
Monocytes Absolute: 0.6 10*3/uL (ref 0.1–1.0)
Monocytes Relative: 7 % (ref 3–12)
Neutro Abs: 3.3 10*3/uL (ref 1.7–7.7)
Neutro Abs: 3.6 10*3/uL (ref 1.7–7.7)
Neutrophils Relative %: 56 % (ref 43–77)

## 2010-07-23 LAB — CULTURE, BLOOD (ROUTINE X 2)
Culture  Setup Time: 201110270523
Culture  Setup Time: 201110270523
Culture: NO GROWTH

## 2010-07-23 LAB — CK TOTAL AND CKMB (NOT AT ARMC)
CK, MB: 2.4 ng/mL (ref 0.3–4.0)
Relative Index: 1.3 (ref 0.0–2.5)
Total CK: 166 U/L (ref 7–232)

## 2010-07-23 LAB — POCT I-STAT 3, ART BLOOD GAS (G3+)
Acid-base deficit: 4 mmol/L — ABNORMAL HIGH (ref 0.0–2.0)
Bicarbonate: 25.3 mEq/L — ABNORMAL HIGH (ref 20.0–24.0)
Patient temperature: 37.6
Patient temperature: 98.6
TCO2: 27 mmol/L (ref 0–100)
pCO2 arterial: 55.3 mmHg — ABNORMAL HIGH (ref 35.0–45.0)
pH, Arterial: 7.208 — ABNORMAL LOW (ref 7.350–7.450)
pH, Arterial: 7.264 — ABNORMAL LOW (ref 7.350–7.450)

## 2010-07-23 LAB — URINE CULTURE
Colony Count: NO GROWTH
Culture: NO GROWTH

## 2010-07-23 LAB — APTT: aPTT: 28 seconds (ref 24–37)

## 2010-07-23 LAB — PHOSPHORUS
Phosphorus: 3 mg/dL (ref 2.3–4.6)
Phosphorus: 3.3 mg/dL (ref 2.3–4.6)
Phosphorus: 4 mg/dL (ref 2.3–4.6)

## 2010-07-23 LAB — PROTIME-INR: INR: 0.97 (ref 0.00–1.49)

## 2010-07-23 LAB — URINE MICROSCOPIC-ADD ON

## 2010-07-23 LAB — BLOOD GAS, ARTERIAL
Acid-Base Excess: 0.3 mmol/L (ref 0.0–2.0)
Bicarbonate: 25.1 mEq/L — ABNORMAL HIGH (ref 20.0–24.0)
TCO2: 26.5 mmol/L (ref 0–100)
pCO2 arterial: 45.8 mmHg — ABNORMAL HIGH (ref 35.0–45.0)
pH, Arterial: 7.358 (ref 7.350–7.450)
pO2, Arterial: 128 mmHg — ABNORMAL HIGH (ref 80.0–100.0)

## 2010-07-23 LAB — MRSA PCR SCREENING: MRSA by PCR: NEGATIVE

## 2010-08-13 LAB — URINALYSIS, ROUTINE W REFLEX MICROSCOPIC
Bilirubin Urine: NEGATIVE
Leukocytes, UA: NEGATIVE
Nitrite: NEGATIVE
Specific Gravity, Urine: 1.023 (ref 1.005–1.030)
Urobilinogen, UA: 1 mg/dL (ref 0.0–1.0)
pH: 7 (ref 5.0–8.0)

## 2010-08-13 LAB — CBC
HCT: 45.2 % (ref 39.0–52.0)
Platelets: 204 10*3/uL (ref 150–400)
Platelets: 211 10*3/uL (ref 150–400)
RDW: 13.5 % (ref 11.5–15.5)
WBC: 6.1 10*3/uL (ref 4.0–10.5)
WBC: 8.4 10*3/uL (ref 4.0–10.5)

## 2010-08-13 LAB — BASIC METABOLIC PANEL
BUN: 12 mg/dL (ref 6–23)
BUN: 13 mg/dL (ref 6–23)
BUN: 18 mg/dL (ref 6–23)
Calcium: 8.5 mg/dL (ref 8.4–10.5)
Chloride: 104 mEq/L (ref 96–112)
Creatinine, Ser: 1.17 mg/dL (ref 0.4–1.5)
GFR calc Af Amer: >60 mL/min (ref >60)
GFR calc Af Amer: >60 mL/min (ref >60)
GFR calc Af Amer: >60 mL/min (ref >60)
GFR calc non Af Amer: >60 mL/min (ref >60)
GFR calc non Af Amer: >60 mL/min (ref >60)
GFR calc non Af Amer: >60 mL/min (ref >60)
GFR calc non Af Amer: >60 mL/min (ref >60)
Glucose, Bld: 109 mg/dL — ABNORMAL HIGH (ref 70–99)
Potassium: 3.5 mEq/L (ref 3.5–5.1)
Potassium: 3.6 mEq/L (ref 3.5–5.1)
Potassium: 3.6 mEq/L (ref 3.5–5.1)
Potassium: 4.3 mEq/L (ref 3.5–5.1)
Sodium: 137 mEq/L (ref 135–145)
Sodium: 138 mEq/L (ref 135–145)
Sodium: 138 mEq/L (ref 135–145)
Sodium: 140 mEq/L (ref 135–145)

## 2010-08-13 LAB — DIFFERENTIAL
Basophils Absolute: 0.2 10*3/uL — ABNORMAL HIGH (ref 0.0–0.1)
Eosinophils Relative: 2 % (ref 0–5)
Lymphocytes Relative: 52 % — ABNORMAL HIGH (ref 12–46)
Monocytes Absolute: 0.6 10*3/uL (ref 0.1–1.0)
Monocytes Relative: 7 % (ref 3–12)
Neutro Abs: 3 10*3/uL (ref 1.7–7.7)

## 2010-08-13 LAB — URINE MICROSCOPIC-ADD ON

## 2010-08-13 LAB — CARDIAC PANEL(CRET KIN+CKTOT+MB+TROPI)
CK, MB: 1 ng/mL (ref 0.3–4.0)
CK, MB: 1.3 ng/mL (ref 0.3–4.0)
CK, MB: 1.9 ng/mL (ref 0.3–4.0)
CK, MB: 2.1 ng/mL (ref 0.3–4.0)
Relative Index: 0.8 (ref 0.0–2.5)
Relative Index: 0.8 (ref 0.0–2.5)
Relative Index: 0.9 (ref 0.0–2.5)
Troponin I: 0.03 ng/mL (ref 0.00–0.06)

## 2010-08-13 LAB — POCT I-STAT 3, ART BLOOD GAS (G3+)
Acid-base deficit: 5 mmol/L — ABNORMAL HIGH (ref 0.0–2.0)
Bicarbonate: 26.7 mEq/L — ABNORMAL HIGH (ref 20.0–24.0)
O2 Saturation: 96 %
Patient temperature: 98.7
pO2, Arterial: 109 mmHg — ABNORMAL HIGH (ref 80.0–100.0)

## 2010-08-13 LAB — BLOOD GAS, ARTERIAL
Bicarbonate: 26 mEq/L — ABNORMAL HIGH (ref 20.0–24.0)
PEEP: 5 cmH2O
Patient temperature: 98
pCO2 arterial: 40.5 mmHg (ref 35.0–45.0)
pH, Arterial: 7.422 (ref 7.350–7.450)

## 2010-08-13 LAB — COMPREHENSIVE METABOLIC PANEL
AST: 31 U/L (ref 0–37)
Albumin: 3.2 g/dL — ABNORMAL LOW (ref 3.5–5.2)
Alkaline Phosphatase: 84 U/L (ref 39–117)
Chloride: 106 mEq/L (ref 96–112)
GFR calc Af Amer: >60 mL/min (ref >60)
Potassium: 4.1 mEq/L (ref 3.5–5.1)
Total Bilirubin: 0.3 mg/dL (ref 0.3–1.2)
Total Protein: 6.5 g/dL (ref 6.0–8.3)

## 2010-08-13 LAB — POCT I-STAT, CHEM 8
BUN: 13 mg/dL (ref 6–23)
Calcium, Ion: 1.14 mmol/L (ref 1.12–1.32)
Chloride: 105 mEq/L (ref 96–112)
Creatinine, Ser: 1.3 mg/dL (ref 0.4–1.5)
Glucose, Bld: 188 mg/dL — ABNORMAL HIGH (ref 70–99)
Potassium: 4.1 mEq/L (ref 3.5–5.1)

## 2010-08-13 LAB — BRAIN NATRIURETIC PEPTIDE
Pro B Natriuretic peptide (BNP): 206 pg/mL — ABNORMAL HIGH (ref 0.0–100.0)
Pro B Natriuretic peptide (BNP): 883 pg/mL — ABNORMAL HIGH (ref 0.0–100.0)

## 2010-08-13 LAB — URINE CULTURE: Culture: NO GROWTH

## 2010-08-13 LAB — PROTIME-INR: Prothrombin Time: 13 seconds (ref 11.6–15.2)

## 2010-08-13 LAB — POCT CARDIAC MARKERS
CKMB, poc: 2.3 ng/mL (ref 1.0–8.0)
Troponin i, poc: <0.05 ng/mL (ref 0.00–0.09)

## 2010-08-13 LAB — CK TOTAL AND CKMB (NOT AT ARMC)
Relative Index: 1.1 (ref 0.0–2.5)
Total CK: 389 U/L — ABNORMAL HIGH (ref 7–232)

## 2010-08-13 LAB — RAPID URINE DRUG SCREEN, HOSP PERFORMED
Cocaine: POSITIVE — AB
Tetrahydrocannabinol: NOT DETECTED

## 2010-08-13 LAB — GLUCOSE, CAPILLARY: Glucose-Capillary: 89 mg/dL (ref 70–99)

## 2010-08-13 LAB — ETHANOL: Alcohol, Ethyl (B): <5 mg/dL (ref 0–10)

## 2010-08-15 LAB — CBC
HCT: 48 % (ref 39.0–52.0)
Hemoglobin: 16.1 g/dL (ref 13.0–17.0)
Hemoglobin: 17.2 g/dL — ABNORMAL HIGH (ref 13.0–17.0)
MCHC: 33.1 g/dL (ref 30.0–36.0)
MCHC: 33.6 g/dL (ref 30.0–36.0)
MCHC: 33.8 g/dL (ref 30.0–36.0)
MCV: 88.3 fL (ref 78.0–100.0)
MCV: 88.4 fL (ref 78.0–100.0)
Platelets: 144 10*3/uL — ABNORMAL LOW (ref 150–400)
Platelets: 145 10*3/uL — ABNORMAL LOW (ref 150–400)
RBC: 4.74 MIL/uL (ref 4.22–5.81)
RBC: 5.84 MIL/uL — ABNORMAL HIGH (ref 4.22–5.81)
RDW: 14.7 % (ref 11.5–15.5)
RDW: 14.9 % (ref 11.5–15.5)
RDW: 15 % (ref 11.5–15.5)
WBC: 4.4 10*3/uL (ref 4.0–10.5)
WBC: 5.9 10*3/uL (ref 4.0–10.5)

## 2010-08-15 LAB — CK TOTAL AND CKMB (NOT AT ARMC)
CK, MB: 3.2 ng/mL (ref 0.3–4.0)
Relative Index: 1.3 (ref 0.0–2.5)
Total CK: 238 U/L — ABNORMAL HIGH (ref 7–232)

## 2010-08-15 LAB — RAPID URINE DRUG SCREEN, HOSP PERFORMED
Amphetamines: NOT DETECTED
Benzodiazepines: POSITIVE — AB

## 2010-08-15 LAB — BLOOD GAS, ARTERIAL
Bicarbonate: 23 mEq/L (ref 20.0–24.0)
PEEP: 10 cmH2O
Patient temperature: 98.6
pCO2 arterial: 41.9 mmHg (ref 35.0–45.0)
pH, Arterial: 7.359 (ref 7.350–7.450)
pO2, Arterial: 324 mmHg — ABNORMAL HIGH (ref 80.0–100.0)

## 2010-08-15 LAB — BASIC METABOLIC PANEL
BUN: 13 mg/dL (ref 6–23)
BUN: 14 mg/dL (ref 6–23)
CO2: 21 mEq/L (ref 19–32)
CO2: 23 mEq/L (ref 19–32)
CO2: 26 mEq/L (ref 19–32)
CO2: 26 mEq/L (ref 19–32)
CO2: 27 mEq/L (ref 19–32)
Calcium: 8.5 mg/dL (ref 8.4–10.5)
Calcium: 8.6 mg/dL (ref 8.4–10.5)
Calcium: 8.7 mg/dL (ref 8.4–10.5)
Calcium: 8.7 mg/dL (ref 8.4–10.5)
Calcium: 9 mg/dL (ref 8.4–10.5)
Chloride: 101 mEq/L (ref 96–112)
Chloride: 104 mEq/L (ref 96–112)
Chloride: 106 mEq/L (ref 96–112)
Chloride: 106 mEq/L (ref 96–112)
Creatinine, Ser: 0.96 mg/dL (ref 0.4–1.5)
Creatinine, Ser: 1 mg/dL (ref 0.4–1.5)
Creatinine, Ser: 1.01 mg/dL (ref 0.4–1.5)
Creatinine, Ser: 1.04 mg/dL (ref 0.4–1.5)
Creatinine, Ser: 1.06 mg/dL (ref 0.4–1.5)
GFR calc Af Amer: >60 mL/min (ref >60)
GFR calc Af Amer: >60 mL/min (ref >60)
GFR calc Af Amer: >60 mL/min (ref >60)
GFR calc Af Amer: >60 mL/min (ref >60)
GFR calc Af Amer: >60 mL/min (ref >60)
GFR calc non Af Amer: >60 mL/min (ref >60)
GFR calc non Af Amer: >60 mL/min (ref >60)
GFR calc non Af Amer: >60 mL/min (ref >60)
Glucose, Bld: 100 mg/dL — ABNORMAL HIGH (ref 70–99)
Glucose, Bld: 158 mg/dL — ABNORMAL HIGH (ref 70–99)
Glucose, Bld: 85 mg/dL (ref 70–99)
Glucose, Bld: 86 mg/dL (ref 70–99)
Potassium: 2.9 mEq/L — ABNORMAL LOW (ref 3.5–5.1)
Sodium: 134 mEq/L — ABNORMAL LOW (ref 135–145)
Sodium: 136 mEq/L (ref 135–145)
Sodium: 137 mEq/L (ref 135–145)
Sodium: 138 mEq/L (ref 135–145)

## 2010-08-15 LAB — POCT I-STAT 3, ART BLOOD GAS (G3+)
O2 Saturation: 96 %
pCO2 arterial: 80.2 mmHg (ref 35.0–45.0)
pH, Arterial: 7.145 — CL (ref 7.350–7.450)
pO2, Arterial: 83 mmHg (ref 80.0–100.0)

## 2010-08-15 LAB — POCT I-STAT 3, VENOUS BLOOD GAS (G3P V)
Bicarbonate: 25.7 mEq/L — ABNORMAL HIGH (ref 20.0–24.0)
O2 Saturation: 71 %
TCO2: 27 mmol/L (ref 0–100)
pO2, Ven: 39 mmHg (ref 30.0–45.0)

## 2010-08-15 LAB — HEPATIC FUNCTION PANEL
AST: 48 U/L — ABNORMAL HIGH (ref 0–37)
Albumin: 3.4 g/dL — ABNORMAL LOW (ref 3.5–5.2)
Bilirubin, Direct: 0.1 mg/dL (ref 0.0–0.3)

## 2010-08-15 LAB — CARDIAC PANEL(CRET KIN+CKTOT+MB+TROPI)
CK, MB: 4 ng/mL (ref 0.3–4.0)
CK, MB: 5 ng/mL — ABNORMAL HIGH (ref 0.3–4.0)
Total CK: 198 U/L (ref 7–232)
Total CK: 258 U/L — ABNORMAL HIGH (ref 7–232)
Total CK: 300 U/L — ABNORMAL HIGH (ref 7–232)
Total CK: 370 U/L — ABNORMAL HIGH (ref 7–232)
Troponin I: 1.19 ng/mL (ref 0.00–0.06)

## 2010-08-15 LAB — PROTIME-INR: Prothrombin Time: 12.2 seconds (ref 11.6–15.2)

## 2010-08-15 LAB — DIFFERENTIAL
Basophils Absolute: 0.1 10*3/uL (ref 0.0–0.1)
Eosinophils Absolute: 0.2 10*3/uL (ref 0.0–0.7)
Lymphocytes Relative: 65 % — ABNORMAL HIGH (ref 12–46)
Lymphs Abs: 6.1 10*3/uL — ABNORMAL HIGH (ref 0.7–4.0)
Neutro Abs: 2.1 10*3/uL (ref 1.7–7.7)

## 2010-08-15 LAB — LACTIC ACID, PLASMA: Lactic Acid, Venous: 1.1 mmol/L (ref 0.5–2.2)

## 2010-08-15 LAB — POCT CARDIAC MARKERS
CKMB, poc: 2.4 ng/mL (ref 1.0–8.0)
Myoglobin, poc: 88.5 ng/mL (ref 12–200)

## 2010-08-15 LAB — CULTURE, BLOOD (ROUTINE X 2)

## 2010-08-15 LAB — HEPARIN LEVEL (UNFRACTIONATED)
Heparin Unfractionated: 0.22 IU/mL — ABNORMAL LOW (ref 0.30–0.70)
Heparin Unfractionated: 0.24 IU/mL — ABNORMAL LOW (ref 0.30–0.70)
Heparin Unfractionated: 0.27 IU/mL — ABNORMAL LOW (ref 0.30–0.70)
Heparin Unfractionated: 0.32 IU/mL (ref 0.30–0.70)

## 2010-08-15 LAB — BRAIN NATRIURETIC PEPTIDE: Pro B Natriuretic peptide (BNP): 728 pg/mL — ABNORMAL HIGH (ref 0.0–100.0)

## 2010-08-15 LAB — MAGNESIUM
Magnesium: 1.8 mg/dL (ref 1.5–2.5)
Magnesium: 2 mg/dL (ref 1.5–2.5)

## 2010-08-15 LAB — GLUCOSE, CAPILLARY: Glucose-Capillary: 119 mg/dL — ABNORMAL HIGH (ref 70–99)

## 2010-08-23 ENCOUNTER — Inpatient Hospital Stay (HOSPITAL_COMMUNITY)
Admission: EM | Admit: 2010-08-23 | Discharge: 2010-08-27 | DRG: 291 | Disposition: A | Discharge status: Home / Self Care | Payer: Medicaid Other | Attending: Internal Medicine | Admitting: Internal Medicine

## 2010-08-23 ENCOUNTER — Emergency Department (HOSPITAL_COMMUNITY): Payer: Medicaid Other

## 2010-08-23 DIAGNOSIS — J9600 Acute respiratory failure, unspecified whether with hypoxia or hypercapnia: Secondary | ICD-10-CM | POA: Diagnosis present

## 2010-08-23 DIAGNOSIS — E872 Acidosis, unspecified: Secondary | ICD-10-CM | POA: Diagnosis present

## 2010-08-23 DIAGNOSIS — F17200 Nicotine dependence, unspecified, uncomplicated: Secondary | ICD-10-CM | POA: Diagnosis present

## 2010-08-23 DIAGNOSIS — Z9119 Patient's noncompliance with other medical treatment and regimen: Secondary | ICD-10-CM

## 2010-08-23 DIAGNOSIS — I5023 Acute on chronic systolic (congestive) heart failure: Principal | ICD-10-CM | POA: Diagnosis present

## 2010-08-23 DIAGNOSIS — F1010 Alcohol abuse, uncomplicated: Secondary | ICD-10-CM | POA: Diagnosis present

## 2010-08-23 DIAGNOSIS — E785 Hyperlipidemia, unspecified: Secondary | ICD-10-CM | POA: Diagnosis present

## 2010-08-23 DIAGNOSIS — J189 Pneumonia, unspecified organism: Secondary | ICD-10-CM | POA: Diagnosis present

## 2010-08-23 DIAGNOSIS — J4489 Other specified chronic obstructive pulmonary disease: Secondary | ICD-10-CM | POA: Diagnosis present

## 2010-08-23 DIAGNOSIS — I509 Heart failure, unspecified: Secondary | ICD-10-CM | POA: Diagnosis present

## 2010-08-23 DIAGNOSIS — J449 Chronic obstructive pulmonary disease, unspecified: Secondary | ICD-10-CM | POA: Diagnosis present

## 2010-08-23 DIAGNOSIS — R509 Fever, unspecified: Secondary | ICD-10-CM | POA: Diagnosis present

## 2010-08-23 DIAGNOSIS — I2510 Atherosclerotic heart disease of native coronary artery without angina pectoris: Secondary | ICD-10-CM | POA: Diagnosis present

## 2010-08-23 DIAGNOSIS — Z91199 Patient's noncompliance with other medical treatment and regimen due to unspecified reason: Secondary | ICD-10-CM

## 2010-08-23 DIAGNOSIS — Z7982 Long term (current) use of aspirin: Secondary | ICD-10-CM

## 2010-08-23 LAB — POCT I-STAT 3, ART BLOOD GAS (G3+)
Acid-base deficit: 6 mmol/L — ABNORMAL HIGH (ref 0.0–2.0)
O2 Saturation: 100 %
TCO2: 27 mmol/L (ref 0–100)
pCO2 arterial: 75 mmHg (ref 35.0–45.0)

## 2010-08-23 LAB — POCT CARDIAC MARKERS: Myoglobin, poc: 68.9 ng/mL (ref 12–200)

## 2010-08-23 LAB — CBC
Hemoglobin: 14.1 g/dL (ref 13.0–17.0)
MCH: 27.6 pg (ref 26.0–34.0)
RBC: 5.1 MIL/uL (ref 4.22–5.81)

## 2010-08-23 LAB — DIFFERENTIAL
Basophils Absolute: 0 10*3/uL (ref 0.0–0.1)
Basophils Relative: 1 % (ref 0–1)
Monocytes Relative: 8 % (ref 3–12)
Neutro Abs: 3.8 10*3/uL (ref 1.7–7.7)
Neutrophils Relative %: 50 % (ref 43–77)

## 2010-08-24 ENCOUNTER — Inpatient Hospital Stay (HOSPITAL_COMMUNITY): Payer: Medicaid Other

## 2010-08-24 DIAGNOSIS — I509 Heart failure, unspecified: Secondary | ICD-10-CM

## 2010-08-24 DIAGNOSIS — E872 Acidosis: Secondary | ICD-10-CM

## 2010-08-24 DIAGNOSIS — J9600 Acute respiratory failure, unspecified whether with hypoxia or hypercapnia: Secondary | ICD-10-CM

## 2010-08-24 DIAGNOSIS — I5023 Acute on chronic systolic (congestive) heart failure: Secondary | ICD-10-CM

## 2010-08-24 LAB — MRSA PCR SCREENING: MRSA by PCR: NEGATIVE

## 2010-08-24 LAB — COMPREHENSIVE METABOLIC PANEL
ALT: 38 U/L (ref 0–53)
AST: 64 U/L — ABNORMAL HIGH (ref 0–37)
Albumin: 3.5 g/dL (ref 3.5–5.2)
Calcium: 7.9 mg/dL — ABNORMAL LOW (ref 8.4–10.5)
GFR calc Af Amer: >60 mL/min (ref >60)
Sodium: 137 mEq/L (ref 135–145)
Total Protein: 6.3 g/dL (ref 6.0–8.3)

## 2010-08-24 LAB — POCT I-STAT 3, ART BLOOD GAS (G3+)
Bicarbonate: 17.5 mEq/L — ABNORMAL LOW (ref 20.0–24.0)
O2 Saturation: 99 %
TCO2: 19 mmol/L (ref 0–100)
TCO2: 23 mmol/L (ref 0–100)
pCO2 arterial: 36.7 mmHg (ref 35.0–45.0)
pCO2 arterial: 44.1 mmHg (ref 35.0–45.0)
pH, Arterial: 7.283 — ABNORMAL LOW (ref 7.350–7.450)
pO2, Arterial: 154 mmHg — ABNORMAL HIGH (ref 80.0–100.0)

## 2010-08-24 LAB — CBC
HCT: 37.9 % — ABNORMAL LOW (ref 39.0–52.0)
RDW: 14.3 % (ref 11.5–15.5)
WBC: 7 10*3/uL (ref 4.0–10.5)

## 2010-08-24 LAB — BASIC METABOLIC PANEL
CO2: 21 mEq/L (ref 19–32)
Calcium: 7.9 mg/dL — ABNORMAL LOW (ref 8.4–10.5)
GFR calc Af Amer: >60 mL/min (ref >60)
GFR calc non Af Amer: >60 mL/min (ref >60)
Sodium: 139 mEq/L (ref 135–145)

## 2010-08-24 LAB — LACTIC ACID, PLASMA: Lactic Acid, Venous: 1.6 mmol/L (ref 0.5–2.2)

## 2010-08-24 LAB — PROTIME-INR
INR: 0.97 (ref 0.00–1.49)
Prothrombin Time: 13.1 seconds (ref 11.6–15.2)

## 2010-08-25 ENCOUNTER — Inpatient Hospital Stay (HOSPITAL_COMMUNITY): Payer: Medicaid Other

## 2010-08-25 LAB — CBC
Hemoglobin: 13.3 g/dL (ref 13.0–17.0)
MCH: 27.7 pg (ref 26.0–34.0)
Platelets: 207 10*3/uL (ref 150–400)
RBC: 4.8 MIL/uL (ref 4.22–5.81)
WBC: 9 10*3/uL (ref 4.0–10.5)

## 2010-08-25 LAB — GLUCOSE, CAPILLARY: Glucose-Capillary: 119 mg/dL — ABNORMAL HIGH (ref 70–99)

## 2010-08-25 LAB — BASIC METABOLIC PANEL
CO2: 24 mEq/L (ref 19–32)
Chloride: 106 mEq/L (ref 96–112)
GFR calc Af Amer: >60 mL/min (ref >60)
Potassium: 3.7 mEq/L (ref 3.5–5.1)
Sodium: 138 mEq/L (ref 135–145)

## 2010-08-25 LAB — PHOSPHORUS: Phosphorus: 3.7 mg/dL (ref 2.3–4.6)

## 2010-08-25 LAB — MAGNESIUM: Magnesium: 2 mg/dL (ref 1.5–2.5)

## 2010-08-26 LAB — CARDIAC PANEL(CRET KIN+CKTOT+MB+TROPI)
CK, MB: 2.7 ng/mL (ref 0.3–4.0)
Relative Index: 1.5 (ref 0.0–2.5)
Total CK: 205 U/L (ref 7–232)
Troponin I: 0.01 ng/mL (ref 0.00–0.06)

## 2010-08-26 LAB — COMPREHENSIVE METABOLIC PANEL
ALT: 32 U/L (ref 0–53)
ALT: 43 U/L (ref 0–53)
AST: 40 U/L — ABNORMAL HIGH (ref 0–37)
AST: 82 U/L — ABNORMAL HIGH (ref 0–37)
Albumin: 3.4 g/dL — ABNORMAL LOW (ref 3.5–5.2)
Alkaline Phosphatase: 90 U/L (ref 39–117)
CO2: 25 mEq/L (ref 19–32)
CO2: 26 mEq/L (ref 19–32)
Calcium: 8.6 mg/dL (ref 8.4–10.5)
Chloride: 102 mEq/L (ref 96–112)
Chloride: 105 mEq/L (ref 96–112)
Creatinine, Ser: 1.03 mg/dL (ref 0.4–1.5)
GFR calc Af Amer: >60 mL/min (ref >60)
GFR calc Af Amer: >60 mL/min (ref >60)
GFR calc non Af Amer: >60 mL/min (ref >60)
GFR calc non Af Amer: >60 mL/min (ref >60)
Glucose, Bld: 86 mg/dL (ref 70–99)
Sodium: 138 mEq/L (ref 135–145)
Sodium: 138 mEq/L (ref 135–145)
Total Bilirubin: 0.6 mg/dL (ref 0.3–1.2)
Total Bilirubin: 0.9 mg/dL (ref 0.3–1.2)

## 2010-08-26 LAB — BASIC METABOLIC PANEL
BUN: 16 mg/dL (ref 6–23)
BUN: 23 mg/dL (ref 6–23)
CO2: 18 mEq/L — ABNORMAL LOW (ref 19–32)
CO2: 26 mEq/L (ref 19–32)
Chloride: 106 mEq/L (ref 96–112)
Chloride: 107 mEq/L (ref 96–112)
Chloride: 99 mEq/L (ref 96–112)
Creatinine, Ser: 1.35 mg/dL (ref 0.4–1.5)
GFR calc Af Amer: >60 mL/min (ref >60)
GFR calc Af Amer: >60 mL/min (ref >60)
GFR calc non Af Amer: >60 mL/min (ref >60)
Potassium: 3.2 mEq/L — ABNORMAL LOW (ref 3.5–5.1)
Potassium: 3.6 mEq/L (ref 3.5–5.1)
Potassium: 5.1 mEq/L (ref 3.5–5.1)
Sodium: 139 mEq/L (ref 135–145)
Sodium: 136 mEq/L (ref 135–145)

## 2010-08-26 LAB — URINALYSIS, MICROSCOPIC ONLY
Leukocytes, UA: NEGATIVE
Protein, ur: NEGATIVE mg/dL
Urobilinogen, UA: 0.2 mg/dL (ref 0.0–1.0)

## 2010-08-26 LAB — CBC
Hemoglobin: 16.1 g/dL (ref 13.0–17.0)
MCHC: 33.5 g/dL (ref 30.0–36.0)
MCV: 81.1 fL (ref 78.0–100.0)
MCV: 86.7 fL (ref 78.0–100.0)
Platelets: 220 10*3/uL (ref 150–400)
RBC: 4.98 MIL/uL (ref 4.22–5.81)
RBC: 5.55 MIL/uL (ref 4.22–5.81)
WBC: 7.3 10*3/uL (ref 4.0–10.5)
WBC: 8.7 10*3/uL (ref 4.0–10.5)

## 2010-08-26 LAB — RAPID URINE DRUG SCREEN, HOSP PERFORMED
Amphetamines: NOT DETECTED
Cocaine: POSITIVE — AB
Opiates: POSITIVE — AB
Tetrahydrocannabinol: NOT DETECTED

## 2010-08-26 LAB — ETHANOL: Alcohol, Ethyl (B): <5 mg/dL (ref 0–10)

## 2010-08-26 LAB — POCT CARDIAC MARKERS: Troponin i, poc: <0.05 ng/mL (ref 0.00–0.09)

## 2010-08-27 LAB — URINE CULTURE
Colony Count: NO GROWTH
Culture: NO GROWTH

## 2010-08-30 LAB — CULTURE, BLOOD (ROUTINE X 2)
Culture  Setup Time: 201204151159
Culture: NO GROWTH

## 2010-09-04 NOTE — Discharge Summary (Signed)
[name], [name]              ACCOUNT NO.:  [id number]  MEDICAL RECORD NO.:  [id number]           PATIENT TYPE:  I  LOCATION:  3711                         FACILITY:  [facility]  PHYSICIAN:  Rock Nephew, MD       DATE OF BIRTH:  05-04-1951  DATE OF ADMISSION:  08/23/2010 DATE OF DISCHARGE:                        DISCHARGE SUMMARY - REFERRING   PRIMARY CARE PHYSICIAN:  The patient does not have primary care physician.  He should establish care with the primary care physician.  DISCHARGE DIAGNOSES:  Are as follows: 1. Acute systolic heart failure, resolved. 2. Hypoxemic respiratory failure, resolved. 3. Possible community-acquired pneumonia with fever. 4. Chronic obstructive pulmonary disease, chronic. 5. Coronary artery disease, nonobstructive. 6. Hyperlipidemia. 7. Alcohol abuse, counseled. 8. Tobacco abuse.  DISCHARGE MEDICATIONS:  Are as follows: 1. Azithromycin 500 mg p.o. daily for 2 days. 2. Bisoprolol 5 mg p.o. daily. 3. Colace 100 mg p.o. twice daily. 4. Furosemide 40 mg p.o. daily. 5. Aspirin 325 mg p.o. daily. 6. Combivent 2 puffs inhaled 4 times daily. 7. Omeprazole 20 mg p.o. daily. 8. Simvastatin 40 mg p.o. daily.  DISPOSITION:  The patient is discharged home.  DIET:  The patient's diet should be heart-healthy 1.5 L fluid restriction.  The patient should follow up with the primary care physician within 1 week.  The patient should follow up with Dr. Olga Millers who has had an outpatient to visit within the past in 2-4 weeks.  CONSULTATIONS:  None.  The patient was previously on the Critical Care Medicine Service.  PROCEDURES PERFORMED:  Intubation, extubation.  Patient had chest x-ray on 08/23/2010 which showed extensive bilateral pulmonary infiltrates, questionable pulmonary edema or infection.  A portable x-ray on 08/25/2010, portable abdominal x-ray showed moderate amount of gas fecal material but without suspicion for ileus or obstruction.  Last  chest x- ray on 08/25/2010 showed bilateral pulmonary opacity showing little or overall change.  BRIEF HISTORY OF PRESENT ILLNESS:  This is a 60 year old male with a history of systolic congestive heart failure with medical noncompliance polysubstance abuse who was found down by EMS brought that brought into the.  The patient was brought in intubated with hypoxic hypoxemic respiratory failure.  HOSPITAL COURSE: 1. Acute systolic heart failure.  The patient was admitted with PCCM     Service.  Patient was intubated and patient was diuresed with     Lasix.  Patient's status improved and patient was placed on a beta-     blocker and Lasix was changed to p.o. 2. Hypoxemia.  The patient had hypoxemic respiratory failure and was     most likely secondary to acute systolic congestive heart failure     secondary to noncompliance medications.  This has resolved by     treatment of the acute systolic heart failure.  The patient was     empirically treated for pneumonia. 3. Community-acquired pneumonia with fever.  The patient had bilateral infiltrates.  The patient appears to have these infiltrates,     chronic.  The patient was placed on antibiotics.  The patient had     cultures drawn which were negative.  The patient was on ceftriaxone     and azithromycin.  He will be discharged on azithromycin until 19th     to complete a 5-day course. 4. Chronic obstructive pulmonary disease.  The patient has chronic     obstructive pulmonary disease.  The patient received Combivent     metered-dose inhalers throughout the hospitalization. 5. Coronary artery disease.  The patient received a baby aspirin.  The     patient continued to take full-dose aspirin at home. 6. Hyperlipidemia.  The patient should start simvastatin on discharge.     The patient's LDL was 109. 7. Alcohol abuse.  The patient was counseled. 8. Tobacco abuse.  The patient was counseled.  The patient was also counseled on the  importance of following up with physicians as well as taking his medications.     Rock Nephew, MD     NH/MEDQ  D:  08/27/2010  T:  08/27/2010  Job:  [id number]  Electronically Signed by Rock Nephew MD on 09/04/2010 09:37:59 PM

## 2010-09-23 NOTE — H&P (Signed)
[name], [name]              ACCOUNT NO.:  [id number]   MEDICAL RECORD NO.:  [id number]          PATIENT TYPE:  INP   LOCATION:  2302                         FACILITY:  [facility]   PHYSICIAN:  [name], Martin VazquezDATE OF BIRTH:  Oct 04, 1950   DATE OF ADMISSION:  06/24/2008  DATE OF DISCHARGE:                              HISTORY & PHYSICAL   PRIMARY CARE PHYSICIAN:  Unassigned.   CHIEF COMPLAINT:  Chest pain, shortness of breath history.   HISTORY OF PRESENT ILLNESS:  A 60 year old African American male with  acute chest pain and shortness of breath while walking to a store.  He  described this as a burning pressure at his sternum and epigastric area  radiating to his right arm and back.  He describes it as worse on  exertion, relieved with nitroglycerin.  He states that he ran out of his  Lasix yesterday and otherwise, has not taken any other medications since  his last discharge in April due to financial reasons.  The patient has 2-  pillow orthopnea, but no edema and normal urine output.  He denies any  changes in diet, but does not follow a low-sodium diet.  He notes  productive sputum, but not necessarily more increased than usual.   En route to the EMS, the patient received Lasix 40 mg IV and  nitroglycerin with some relief of chest pain.  In the emergency  department, blood pressure was noted to be 199/137.  The patient  received morphine and was placed on a non-rebreather.  The patient was  placed on a nitroglycerin drip for control of blood pressure.   PAST MEDICAL HISTORY:  CHF, hypertension, tobacco abuse, COPD, and  coronary artery disease.   PAST SURGICAL HISTORY:  Left knee surgery in 2001.   ALLERGIES:  No known drug allergies.   MEDICATIONS:  Lasix 40 mg daily.  He states he had 5 refills from prior  hospitalization in April 2009, which is why he has able to continue  taking it.  He has not gotten his other discharge medications due to  financial reasons  those include:  1. Lisinopril 5 mg twice daily.  2. Potassium chloride 20 mEq once daily.  3. Norvasc 5 mg daily.  4. Avelox 400 mg x5 additional days.  5. Mucinex 600 mg twice daily over the counter for 1 week.  6. Combivent inhaler 2 puffs 4 times daily as needed.  7. Prilosec 20 mg daily.  8. Zocor 40 mg daily.  9. Aspirin 81 mg daily.  10.Nitroglycerin 0.4 mg tablets every 5 minutes.  11.Tylenol 325 mg 4 times a day as needed.   FAMILY HISTORY:  Mother had hypertension and leg amputation.  Dad, no  health problems.  Brother, diabetes mellitus.  No family history of  cancer.   SOCIAL HISTORY:  The patient lives by himself in a rooming house.  He  enjoys his living situation and feels that he is safe at home.  He  currently smokes one half of a pack per week, but previously had been  smoking 1 pack per day.  He notes  three 24-ounce cans of beer per week  and states he used to drink more in the past.  Positive marijuana use in  the past week.  Denies cocaine use.   REVIEW OF SYSTEMS:  The patient denies emesis, has nausea and has had  one episode of minimal bright red blood per rectum 3 days ago, but  remainder of review of systems negative except as per HPI.   PHYSICAL EXAMINATION:  VITAL SIGNS:  Heart rate 86, O2 100% on non-  rebreather, respiratory rate 16, blood pressure 128/90, and heart rate  87.  GENERAL:  Alert and oriented, in no acute distress.  CARDIOVASCULAR:  Regular rate and rhythm.  No murmurs, rubs, or gallops.  RESPIRATORY:  Crackles at bilateral bases extending up to mid scapula.  HEENT:  Oral mucosa moist.  No masses or lesions.  No oropharynx without  exudate or erythema.  The patient is an edentulous except for 1 tooth.  ABDOMEN:  Positive bowel sounds.  Soft, tender to palpation in the right  upper quadrant with some mild tenderness in the epigastric area.  EXTREMITIES:  No edema.   LABORATORY DATA:  Lipase 23.  White blood count 8.7, hemoglobin 16.1,   platelets 195.  Point-of-care enzymes negative.  Sodium 138, potassium  3.8, chloride 105, bicarbonate 25, BUN 9, creatinine 1.03, glucose 101,  calcium 8.6 ,bilirubin 0.9, alkaline phosphatase 119, AST 82, ALT 43,  and total protein 6.4.   Chest x-ray acute pulmonary edema superimposed on COPD, stable mild  cardiomegaly.  CT abdomen, no AAA or retroperitoneal hemorrhage, small  bilateral pleural effusions with bibasilar air space disease.   ASSESSMENT AND PLAN:  A 60 year old African American male with shortness  of breath, chest pain in setting of past medical history of congestive  heart failure, hypertension, coronary artery disease, chronic  obstructive pulmonary disease, noncompliance.  1. Shortness of breath.  The patient's shortness of breath is likely      due to congestive heart failure exacerbation given poor compliance      with medications and low-sodium diet.  The patient has crackles in      bases and no signs of infection.  We will wean O2 to nasal cannula      greater than 92%.  We will diurese with Lasix IV and monitor urine      output, strict I's and O's.  2. Chest pain.  Given history and changes on EKG, we will cycle      cardiac enzymes.  The patient had a recent cardiac cath in April      2009 showing ejection fraction of 30% and left ventricular wall      motion abnormalities.  3. Congestive heart failure, please see problems  #1:shortness of      breath and #2:chest pain.  4. Chronic obstructive pulmonary disease.  The patient does not seem      to be an acute chronic obstructive pulmonary disease exacerbation      given no wheezes, no white blood count, or fever and no acute      increase in sputum.  We will monitor.  Treatment with      albuterol/Atrovent nebs as needed.  5. Hypertension.  Plan to wean nitroglycerin drip.  We will hold      lisinopril given diuresis with Lasix.  We will continue Norvasc 5      mg p.o. and hydralazine 10 mg IV q. 1 hour  p.r.n. blood pressure  greater than 160.  6. Hyperlipidemia.  Continue simvastatin 40 mg p.o. daily.  7. Polysubstance and alcohol abuse.  We will check UDS and alcohol      level.  We will have Ativan 1 mg p.r.n. for anxiety/withdrawal      symptoms.  The patient is showing no signs of withdrawal at this      time.  AST is 2 times and ALT consistent with alcohol abuse.  8. Abdominal pain.  The patient is tender in right upper quadrant with      a CT abdomen negative.  Lipase within normal limits.  Possible      chronic liver etiology.  Given history of alcohol abuse and      elevated liver enzymes.  We will continue to monitor.  9. Prophylaxis.  Protonix, Lovenox.  10.Disposition: likely discharge once the patient achieves good      diuresis, rule out myocardial infarction, stable oxygenation at      baseline.      Martin Harness, MD  Electronically Signed      [name], M.D.  Electronically Signed    KB/MEDQ  D:  06/25/2008  T:  06/25/2008  Job:  [id number]

## 2010-09-23 NOTE — Discharge Summary (Signed)
[name], [name]              ACCOUNT NO.:  [id number]   MEDICAL RECORD NO.:  [id number]          PATIENT TYPE:  INP   LOCATION:  6703                         FACILITY:  [facility]   PHYSICIAN:  Santiago Bumpers. Hensel, M.D.DATE OF BIRTH:  October 15, 1950   DATE OF ADMISSION:  06/24/2008  DATE OF DISCHARGE:  06/26/2008                               DISCHARGE SUMMARY   PRIMARY CARE PHYSICIAN:  The patient does not have primary care  physician but will be seen at [facility].   DISCHARGE DIAGNOSES:  1. Systolic congestive heart failure with ejection fraction of 30-35%.  2. Coronary artery disease.  3. Chronic obstructive pulmonary disease.  4. Hypertension.  5. Tobacco abuse.  6. Alcohol abuse.  7. Cocaine and marijuana abuse.   DISCHARGE MEDICATIONS:  1. Lasix 40 mg p.o. daily.  2. Lisinopril 10 mg p.o. daily.  3. Norvasc 5 mg p.o. daily.  4. Nitroglycerin 0.4 mg sublingual q.5 minutes p.r.n. chest pain.  5. Prilosec 20 mg p.o. daily.  6. Combivent inhaler 2 puffs 4 times a day as needed for wheezing,      shortness of breath.   DISCONTINUED MEDICATIONS:  None.   CONSULTS:  None.   LABORATORY DATA:  Labs on admission were as follows; sodium 138,  potassium 3.8, chloride 105, bicarb 25, BUN 9, creatinine 1.03, glucose  101.  White blood cell is 8.4, hemoglobin 16.1, hematocrit 48.1,  platelets 195.  AST 82, ALT 43, alkaline phosphatase 119, total  bilirubin 0.9, total protein 4.4.   Labs on discharge; cardiac enzymes negative x2, BNP 861.  Electrolytes  within normal limits except for potassium of 3.2.  Urine drug screen  positive for cocaine and opiates.  Alcohol level less than 5.  Please  note that BNP was on day 2 of admission.   STUDIES:  1. Chest x-ray on June 24, 2008, showed acute pulmonary edema      superimposed on COPD.  Stable mild cardiomegaly.  2. EKG from June 24, 2008, showed T-wave flattening/inversion in      lateral leads, LVH, isolated ST elevation in  V3, sinus tachycardia.  3. CT abdomen/pelvis June 24, 2008, showed small bilateral pleural      effusions, bibasilar edema, no acute abdominal or pelvic findings.   BRIEF SYNOPSIS:  This is a 60 year old male, past medical history  significant for CAD, systolic CHF, COPD, hypertension, and polysubstance  abuse, who presented with shortness of breath and chest pain x1 day.  1. Acute on chronic systolic CHF exacerbation.  The patient admitted      to nonadherence to medications, stating that he had run out of his      Lasix 1 day prior to admission and had not been taking any of his      other medications due to financial problems since last discharge in      early 2009.  The patient with chest x-ray and CT as described      above.  The patient with cardiac enzymes negative x2.  The patient      was started on Lasix 40 mg IV t.i.d.  and diuresed well during the      course of his hospitalization.  The patient's CHF was thought to be      worsened in the setting of his cocaine abuse; however, the patient      would not admit to cocaine abuse though he was found to have a      positive urine drug screen on admission.  The patient was advised      against future cocaine abuse as well as alcohol abuse given his      systolic CHF.  The patient's dyspnea improved over the course of      his hospitalization, and the patient went from requiring 2 L nasal      cannula O2 to tolerating room air without desaturations.  The      patient will be discharged to home on Lasix 40 mg as above, ACE      inhibitor as above.  2. COPD.  The patient was admitted with dyspnea as described above;      however, it was thought that the patient's dyspnea was secondary to      CHF exacerbation as opposed to COPD exacerbation.  The patient was      continued on O2 as above during the course of his hospitalization      and gradually improved to the point where he was not requiring O2.      The patient's lung exam was  without wheezes or prolonged expiratory      phase.  The patient did not require albuterol/Atrovent nebulizers      during the course of his hospitalization.  3. Hypertension.  The patient initially presented with hypertension to      199/137.  The patient was started on nitroglycerin drip for this      problem, and the patient's pressures normalized to 120 systolic      over 70s to 80s diastolic on day 2 of admission.  The patient was      taken off of nitroglycerin drip and started on Norvasc 5 mg as well      as Lasix as above.  The patient will be discharged on lisinopril,      Norvasc, and Lasix.  Please avoid beta-blockers in this patient      given cocaine abuse history.  4. Hyperlipidemia.  The patient was started on the Zocor and will be      continued on this on discharge.  The patient should also take      aspirin 81 mg p.o. daily.  5. Elevated AST.  This is likely be secondary to alcohol abuse.  The      patient's AST trended down to 40 on day 2 of admission.  The      patient's alcohol level was less than 5.  The patient did not have      any signs of withdrawal or delirium tremens during the course of      his hospitalization.  6. Polysubstance abuse.  The patient was seen by Social Work and      though he did not admit to his cocaine abuse, did admit to      marijuana and alcohol abuse and will be evaluated at [facility] for further treatment.      The patient was advised against alcohol and cocaine abuse given his      systolic CHF.  7. Chest pain.  The patient presented  with worsening chest pain as      described above.  The EKG findings as described above as well.  The      patient's chest pain had improved over the course of his      hospitalization.  The patient's cardiac enzymes were negative x2      sets.  The patient will be continued on aspirin 81 mg p.o. daily on      discharge.  The patient's chest pain was felt to  be secondary to      CHF exacerbation/cocaine abuse.   FOLLOWUP:  The patient will follow up with Dr. Audria Nine at [facility]  on August 03, 2008, at 8:45 a.m.  The patient was also seen by care  manager during the course of his hospitalization and will fill out  necessary paperwork in order to receive medication assistance.      Bobby Rumpf, MD  Electronically Signed      Santiago Bumpers. Leveda Anna, M.D.  Electronically Signed    KC/MEDQ  D:  06/26/2008  T:  06/27/2008  Job:  [id number]   cc:   Melvern Banker  Maurice March, M.D.

## 2010-09-23 NOTE — Cardiovascular Report (Signed)
[name], [name]              ACCOUNT NO.:  [id number]   MEDICAL RECORD NO.:  [id number]          PATIENT TYPE:  INP   LOCATION:  3733                         FACILITY:  [facility]   PHYSICIAN:  Ricki Rodriguez, M.D.  DATE OF BIRTH:  [birth year]   DATE OF PROCEDURE:  08/24/2007  DATE OF DISCHARGE:  08/24/2007                            CARDIAC CATHETERIZATION   REFERRING PHYSICIAN:  Dr. Sherrie Mustache from [facility] Team F.   Right and left heart catheterization, selective coronary angiography,  and cardiac output study.   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   Approached right femoral artery using 5-French sheath and catheters and  right femoral vein using 7-French sheath and a Swan-Ganz catheter.   COMPLICATIONS:  None.   43 mL of dye was used.   HEMODYNAMIC DATA:  The left ventricular pressure was 118/10 and aortic  pressure was 123/74.  The pulmonary artery pressure was 25/8, right  ventricular pressure was 24/3, wedge pressure was 8/7, and the right  atrial pressure was 5/4.  Cardiac output was 4.24 L per minute by  thermal dilution technique and 4.98 L per minute by Fick method.  Oxygen  saturation was 94% in aorta and 68% in pulmonary artery sample on room  air.   Coronary anatomy.  The left main coronary artery showed luminal  irregularities.   Left anterior descending coronary artery.  The left anterior descending  coronary artery showed some calcification, had gradual proximal tapering  resulting in 50% lesion post diagonal tube origin and then diffuse  severe narrowing of the rest of the vessel.  Diagonal 1 was a small  vessel.  Diagonal 2 had luminal irregularities.   Left circumflex coronary artery.  The left circumflex coronary artery  showed luminal irregularities and distal severe narrowing.  The obtuse  marginal branch 1 was a large vessel with luminal irregularities and  obtuse marginal branch 2 was a small vessel with 20% ostial lesion.   Right coronary artery.  The right coronary artery was dominant.  It was  highly ectatic and had ostial 20%, midvessel 30%, and distal 30%-40%  lesion.  The posterolateral branch had several 20%-50% lesions and  posterior descending coronary artery was ectatic with ostial 50%  narrowing, and midvessel 50% narrowing x2.  The distal vessel was okay.   LV function showed an ejection fraction of about 30% with diffuse wall  motion abnormality.   IMPRESSION:  1. Moderate multivessel, native vessel coronary artery disease.  2. Moderate left ventricular systolic dysfunction.  3. Normal right heart pressures.   RECOMMENDATIONS:  This patient will continue his medical therapy with  ACE inhibitors, beta-blockers, nitroglycerin, and statins and he will  undergo lifestyle modification.      Ricki Rodriguez, M.D.  Electronically Signed     ASK/MEDQ  D:  08/24/2007  T:  08/25/2007  Job:  [id number]

## 2010-09-23 NOTE — Discharge Summary (Signed)
  [name], [name]              ACCOUNT NO.:  [id number]  MEDICAL RECORD NO.:  [id number]           PATIENT TYPE:  I  LOCATION:  3711                         FACILITY:  [facility]  PHYSICIAN:  Rock Nephew, MD       DATE OF BIRTH:  [birth year]  DATE OF ADMISSION:  08/23/2010 DATE OF DISCHARGE:  08/27/2010                        DISCHARGE SUMMARY - REFERRING   ADDENDUM  This is an addendum to job #[id number] to dictate the discharge medications for the patient.  DISCHARGE MEDICATIONS: 1. Azithromycin 500 mg p.o. daily for 2 days. 2. Bisoprolol 2.5 mg by mouth p.o. daily. 3. Colace 100 mg p.o. twice daily. 4. Lasix 40 mg p.o. daily. 5. Lisinopril 5 mg p.o. daily. 6. Bayer aspirin 325 mg p.o. daily. 7. Combivent 2 puffs inhaled 4 times daily. 8. Omeprazole 20 mg p.o. daily. 9. Simvastatin 40 mg p.o. daily.     Rock Nephew, MD     NH/MEDQ  D:  09/23/2010  T:  09/23/2010  Job:  [id number]  Electronically Signed by Rock Nephew MD on 09/23/2010 05:36:29 PM

## 2010-09-23 NOTE — H&P (Signed)
[name], [name]              ACCOUNT NO.:  [id number]   MEDICAL RECORD NO.:  [id number]          PATIENT TYPE:  EMS   LOCATION:  ED                           FACILITY:  [facility]   PHYSICIAN:  Hillery Aldo, M.D.   DATE OF BIRTH:  02/26/1951   DATE OF ADMISSION:  08/21/2007  DATE OF DISCHARGE:                              HISTORY & PHYSICAL   PRIMARY CARE PHYSICIAN:  None.   CHIEF COMPLAINT:  Dyspnea.   HISTORY OF PRESENT ILLNESS:  The patient is a 60 year old male with a  past medical history of untreated hypertension who presents with  progressive shortness of breath.  The patient was unable to sleep last  night because of the shortness of breath.  He was also orthopneic.  He  has no history of similar symptoms.  He denies any associated fever or  chills.  Does report cough productive of white sputum.  He does report  pleuritic type chest pain with deep inspiration.  He denies weight loss,  changes in his appetite, changes in his bowel habits, melena,  hematochezia or dysuria.  He has noticed some dyspnea on exertion that  has increased over time.  Upon initial evaluation in the emergency  department, he was found to have evidence of chronic obstructive  pulmonary disease, bilateral pneumonia, pulmonary edema and therefore is  being admitted for further evaluation and workup.   PAST MEDICAL HISTORY:  1. Hypertension.  2. Chronic obstructive pulmonary disease by radiography.   PAST SURGICAL HISTORY:  Open reduction and internal fixation of spiral  fracture of the left distal femur June 2001.   FAMILY HISTORY:  The patient's mother died in her 79s from a stroke.  She was also hypertensive.  The patient's father died in his 77s of  uncertain causes.  He has 1 healthy brother and 2 healthy offspring.   SOCIAL HISTORY:  The patient is divorced.  He lives alone.  He smokes  approximately 1 pack of tobacco daily.  He drinks 2 drinks approximately  2 times per week.  He denies  drug use.  He is employed full-time as a  Copy.   ALLERGIES:  No known drug allergies.   MEDICATIONS:  None.   REVIEW OF SYSTEMS:  As noted in the elements of the HPI above.  Otherwise a comprehensive 12 point review of systems is negative.   PHYSICAL EXAMINATION:  VITAL SIGNS:  Temperature 97, pulse 100,  respirations 20, blood pressure 160/111, O2 saturation 94% on BiPAP, 30%  FIO2.  GENERAL:  A thin elderly male who is in no acute distress with the  exception of mild dyspnea on BiPAP.  HEENT:  Normocephalic, atraumatic.  PERRL.  EOMI.  Arcus senilis  bilaterally.  Oropharynx is clear.  NECK:  Positive JVD.  Supple, no thyromegaly, no lymphadenopathy.  CHEST:  Bilateral rales 1/3 of the way up.  HEART:  Tachycardic rate, regular rhythm.  No murmurs, rubs or gallops.  ABDOMEN:  Soft, nontender, nondistended with normoactive bowel sounds.  EXTREMITIES:  No clubbing, cyanosis or edema.  No calf tenderness.  SKIN:  Warm and dry.  No rashes.  NEUROLOGICAL:  The patient is alert and oriented x3.  Cranial nerves II-  XII are grossly intact.  Nonfocal.   DATA REVIEW:  A 12 lead EKG shows normal sinus rhythm at 97 beats per  minute with LVH and T-wave inversions in aVL and V4 through V6.  Chest x-  ray shows severe airspace disease with emphysematous changes.  There is  pulmonary edema and questionable bilateral pneumonia.   LABORATORY DATA:  Sodium is 137, potassium 3.3, chloride 103, bicarb 26,  BUN 14, creatinine 1.04, glucose 167.  White blood cell count is 17,  hemoglobin 15.3, hematocrit 47.9, platelets 192 with an absolute  neutrophil count of 13.6.  BNP is 317.  D-dimer is 0.74.  Point of care  cardiac markers are negative x1.   ASSESSMENT AND PLAN:  1. Multifactorial dyspnea:  The patient's dyspnea is likely due to      underlying CHF (congestive heart failure) with acute exacerbation,      pneumonia and chronic obstructive pulmonary disease.  To address      the  patient's CHF, the patient will be placed on Lasix therapy, 40      mg q.8 h. until he is adequately diuresed.  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  We will      check a two dimensional echocardiogram to determine his LV      function.  He may also have some element of right-sided heart      failure given his severe COPD (chronic obstructive pulmonary      disease).  If he has any element of systolic failure, he will need      therapy with an ACE inhibitor.  To address his underlying      pneumonia, the patient will have sputum cultures done and be      empirically put on Avelox.  To address his COPD, the patient will      be maintained on BiPAP until his respiratory status is stable.  We      will wean this as tolerated.  We will also start him on nebulized      bronchodilator therapy, Mucinex and antitussives p.r.n.  2. Hypertensive urgency:  The patient has a history of untreated      hypertension.  He has evidence of LVH (left ventricular      hypertrophy) on EKG tracings.  He will need good control of his      blood pressure to prevent further sequelae of untreated      hypertension.  The patient is currently on a nitroglycerin drip      which we will wean as tolerated.  We will start him on Norvasc and      continue Lasix for diuresis.  We will adjust his blood pressure      medications as tolerated with titration of a diuretic to achieve      good diuresis without inducing renal insufficiency.  3. Hyperglycemia:  We will check the patient's hemoglobin A1c value      and start him on sliding scale insulin for now.  If he is truly      diabetic he will need extensive instruction and possibly an oral      regimen.  4. Hypokalemia:  We will replete the patient's potassium and check a      magnesium value.  5. Elevated D-dimer:  The patient's likelihood of having pulmonary  embolism is quite low.  He is active and we have multiple  other      explanations for his dyspnea.  Nevertheless, CT angiogram has been      ordered by the ED physicians.  Will follow up with regard to the      results of this test.  6. Arcus senilis:  We will check the patient's fasting lipid panel in      the morning.  7. Prophylaxis:  We will initiate subcutaneous heparin for DVT (deep      venous thrombosis) prophylaxis and Protonix for GI      (gastrointestinal) prophylaxis.      Hillery Aldo, M.D.  Electronically Signed     CR/MEDQ  D:  08/21/2007  T:  08/21/2007  Job:  [id number]

## 2010-09-26 NOTE — Discharge Summary (Signed)
[name], [name]              ACCOUNT NO.:  [id number]   MEDICAL RECORD NO.:  [id number]          PATIENT TYPE:  INP   LOCATION:  6703                         FACILITY:  [facility]   PHYSICIAN:  Santiago Bumpers. Hensel, M.D.DATE OF BIRTH:  [birth year]   DATE OF ADMISSION:  06/24/2008  DATE OF DISCHARGE:  06/26/2008                               DISCHARGE SUMMARY   ADDENDUM:  Please add aspirin 81 mg p.o. daily to the patient's discharge  medications.      Bobby Rumpf, MD  Electronically Signed      Santiago Bumpers. Leveda Anna, M.D.  Electronically Signed    KC/MEDQ  D:  08/13/2008  T:  08/14/2008  Job:  [id number]

## 2010-09-26 NOTE — Discharge Summary (Signed)
[name], [name]              ACCOUNT NO.:  [id number]   MEDICAL RECORD NO.:  [id number]          PATIENT TYPE:  INP   LOCATION:  1444                         FACILITY:  [facility]   PHYSICIAN:  Ricki Rodriguez, M.D.  DATE OF BIRTH:  June 22, 1950   DATE OF ADMISSION:  08/21/2007  DATE OF DISCHARGE:  08/23/2007                               DISCHARGE SUMMARY   FINAL DIAGNOSES:  1. Acute systolic left heart failure.  2. Pneumonia.  3. Primary cardiomyopathy.  4. Congestive heart failure.  5. Hypertension.  6. Tobacco use disorder.  7. Chronic airway obstruction.  8. Hypopotassemia.  9. Coronary atherosclerosis of native coronary vessel.   PRINCIPAL PROCEDURE:  Right and left heart catheterization subsequent  coronary angiography done by Dr. Orpah Cobb on August 23, 2007.   DISCHARGE MEDICATIONS:  1. Lasix 40 mg one daily.  2. Lisinopril 5 mg 1 twice daily.  3. KCL 20 mEq 1 daily.  4. Norvasc 5 mg 1 daily.  5. Avelox 400 mg 1 daily x5 additional days.  6. Mucinex 600 mg 1 twice daily over-the-counter for 1 week.  7. Combivent inhaler 2 puffs 4 times daily as needed.  8. Prilosec 20 mg 1 daily, generic over-the-counter.  9. Zocor 40 mg 1 daily.  10.Aspirin 81 mg 1 daily.  11.Nitroglycerin 0.4 mg tablet every 5 minutes x3 as needed for chest      pain.  12.Tylenol 325 mg 4 times daily as needed.   DISCHARGE DIET:  Low-sodium heart-healthy diet.   DISCHARGE ACTIVITY:  The patient to increase activity slowly.   WOUND CARE INSTRUCTION:  The patient to notify right groin pain swelling  on discharge.   SPECIAL INSTRUCTION:  The patient to stop any activity that causes chest  pain, shortness of breath, dizziness, sweating, or excessive weakness  and the patient to decrease smoking, and alcohol by 50% in 1 week and  then another 50% in the next 2-3 weeks and then discontinue as soon as  possible.  Followup by Dr. Orpah Cobb in 1 month.  The patient to call  [phone] for  appointment.   HISTORY:  This 60 year old black male with no known coronary artery  disease presented with heart failure and moderate to severe LV systolic  dysfunction.  He had cardiac risk factors of hypertension, smoking, and  alcohol use.   PHYSICAL EXAMINATION:  VITAL SIGNS:  Temperature 97, pulse 70,  respirations 20, and blood pressure 138/77.  HEENT:  The patient is normocephalic and atraumatic.  He has brown eyes  and decreased vision.  NECK:  No JVD.  LUNGS:  Clear bilaterally with few rhonchi.  HEART:  Normal S1 and S2 sinus with grade 2/6 systolic murmur.  ABDOMEN:  Soft.  EXTREMITIES:  No edema.   LABORATORY DATA:  Normal hemoglobin, hematocrit, WBC count apparently  high on day one 17,000, subsequent WBC count was down to 6600, platelet  count normal, electrolytes near normal, glucose slightly elevated at  167.  Subsequent glucose of 106, potassium was 3.3, subsequent potassium  was 3.6, and albumin slightly low at 3.1.  Other liver  enzymes were  normal.  Hemoglobin A1c was 6.  CK-MB, troponin I negative x3, B-  natriuretic peptide elevated at 317.  Subsequent BNP was 168 and thyroid  stimulating hormone level was 1.2.   Echocardiogram showed dilated left ventricle with ejection fraction of  30-35% with a significant akinesia of apical and hypokinesia of distal  inferior and inferior wall.  Left atrium was slightly dilated.   EKG showed sinus rhythm biatrial enlargement and left ventricular  hypertrophy and lateral ischemia.   Right and left heart catheterization done by Dr. Orpah Cobb showed  normal right heart pressures, but moderate multivessel, native vessel,  coronary artery disease, and moderate LV systolic dysfunction by  echocardiogram.   HOSPITAL COURSE:  The patient was admitted to telemetry unit, myocardial  infarction was ruled out.  He had a moderate LV systolic dysfunction for  which he underwent right and left heart catheterization that showed   moderate multivessel native vessel coronary artery disease, but a normal  right heart pressures.  He was started on ACE inhibitor, beta-blocker,  nitroglycerin, and stenting.  His other medications were adjusted and he  was discharged home in satisfactory condition with followup by me in 1  month.      Ricki Rodriguez, M.D.  Electronically Signed     ASK/MEDQ  D:  09/28/2007  T:  09/29/2007  Job:  [id number]

## 2010-09-26 NOTE — Op Note (Signed)
[place]  Patient:    [name], [name]                     MRN: [id number] Proc. Date: 11/07/99 Adm. Date:  [id number] Attending:  Randolm Idol                           Operative Report  PREOPERATIVE DIAGNOSIS:  Displaced comminuted segmental supracondylar fracture of left distal femur.  POSTOPERATIVE DIAGNOSIS:  Displaced comminuted segmental supracondylar fracture of left distal femur.  PROCEDURE:  Open reduction, internal fixation.  SURGEON:  Claude Manges. Cleophas Dunker, M.D.  ASSISTANT:  Jamelle Rushing, P.A.  ANESTHESIA:  General oral tracheal.  COMPLICATIONS:  None.  PROCEDURE IN DETAIL:  With the patient comfortable on the operating room table and under general orotracheal anesthesia, the left lower extremity was prepped with Betadine scrub and DuraPrep from the groin to the ankle.  Sterile draping was performed.  A sterile tourniquet was applied with the extremity elevated.  It was esmarched and exsanguinated with the proximal tourniquet at 250 mmHg.  A midline longitudinal incision was made centered over the patella extending from the patella tendon to the superior pouch via sharp dissection the incision was carried down to the subcutaneous tissue.  The first layer of capsule was incised at midline.  A medial parapatellar incision was then made through the deep capsule.  There was a large hemarthrosis which was evacuated. The patient was also noted to have a large loose body in the knee joint which was retrieved and measured probably 2.5 cm x 1.3 or 1.4 cm in diameter.  It was located in the intercondylar notch.  The triangles were utilized to position the fracture and image intensification it was obvious that the fracture was both longitudinal and had multiple components including a transverse and longitudinal.  We were able to achieve reduction in 1 plane but not in the other and therefore it was felt that a simple  supracondylar retrograde nail was most likely not going to provide stable internal fixation.  We, therefore, incised the quadriceps mechanism more proximally and under direct visualization we were able to place a circumferential Dall-Miles cable through the center of the fracture from proximal to distal plane.  With tension on the wire, image intensification revealed anatomic reduction of the fracture on both AP and lateral projections.  The wire was tightened and then crimped and the end of the wires were excised.  A guide pin was then placed through the supracondylar notch just superior to the intercondylar notch.  It was noted to be in excellent position on both AP and lateral projection.  A 12.5 mm hole was then drilled. The short guide pin was removed and a longer guide pin inserted and checked to be sure that it was well proximal to the fracture site.  A 12 mm wide and 25 mm long supracondylar titanium Ace nail was then impacted and recessed about 4-5 mm into the femoral condyle.  We again checked the image intensification and felt they were well above the fracture site.  We decided to insert 1 screw proximally and 2 distally and using the external and lateral guide an incision was then made along the lateral femoral region.  We initially inserted a drill hole and felt that it was anterior and not traverse through the femur and the rod and accordingly abandoned the more distal of the 2 proximal  screws and simply inserted the more proximal screw and this was performed without difficulty again using the guide appropriately drilling, measuring and filling with the self-capping and cortical screws.  Two distal transverse screws were then utilized again using the guide system with appropriate drilling, measuring and filling with the 6-5 mm screws.  The more distal screw required a washer as the bone is soft.  We checked it in both AP and lateral projections and we had excellent fixation  with anatomic placement of the screws through the holes and anatomic location of the fracture.  All wounds were then copiously irrigated with saline solution.  The fascia was closed with 0 Vicryl in the supracondylar area.  The deep capsule was closed with interrupted 0 Ethibond.  The Hemovac was inserted and the wound was again irrigated.  The superficial capsule was closed with a running 0 Vicryl and skin closed with skin clips.  Fascia was closed along the 2 lateral incisions with 0 Vicryl and the skin closed with the skin clips.  A sterile bulky dressing was applied followed by an Ace bandage.  Hemovac was inserted prior to wound closure.  The tourniquet was deflated at 1 hour and 40 minutes. The patient had immediate capillary refill in the toes.  Knee immobilizer was applied and the patient returned to the postanesthesia recovery room in stable condition. DD:  11/07/99 TD:  11/08/99 Job: [id number]
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
## 2011-02-03 LAB — CBC
Hemoglobin: 15.3
Platelets: 177
Platelets: 192
RDW: 14.4
WBC: 6.6

## 2011-02-03 LAB — BASIC METABOLIC PANEL
BUN: 12
BUN: 14
Calcium: 8.3 — ABNORMAL LOW
Calcium: 9
Chloride: 102
Creatinine, Ser: 1.04
Creatinine, Ser: 1.14
GFR calc non Af Amer: >60
GFR calc non Af Amer: >60
Glucose, Bld: 167 — ABNORMAL HIGH
Glucose, Bld: 91
Potassium: 4
Sodium: 137

## 2011-02-03 LAB — CK TOTAL AND CKMB (NOT AT ARMC)
CK, MB: 2.5
Total CK: 151

## 2011-02-03 LAB — POCT I-STAT 3, VENOUS BLOOD GAS (G3P V)
Bicarbonate: 28.4 — ABNORMAL HIGH
TCO2: 30
pH, Ven: 7.391 — ABNORMAL HIGH
pO2, Ven: 36

## 2011-02-03 LAB — HEPATIC FUNCTION PANEL
Albumin: 3.1 — ABNORMAL LOW
Bilirubin, Direct: 0.1
Total Bilirubin: 0.6

## 2011-02-03 LAB — DIFFERENTIAL
Basophils Relative: 0
Eosinophils Relative: 1
Monocytes Absolute: 0.7
Monocytes Relative: 4
Neutro Abs: 13.6 — ABNORMAL HIGH

## 2011-02-03 LAB — APTT: aPTT: 30

## 2011-02-03 LAB — POCT CARDIAC MARKERS
CKMB, poc: <1 — ABNORMAL LOW
Myoglobin, poc: 37.5

## 2011-02-03 LAB — TSH: TSH: 1.287

## 2011-02-03 LAB — POCT I-STAT 3, ART BLOOD GAS (G3+)
O2 Saturation: 94
pCO2 arterial: 43.1
pO2, Arterial: 71 — ABNORMAL LOW

## 2011-02-03 LAB — HEMOGLOBIN A1C
Hgb A1c MFr Bld: 6
Mean Plasma Glucose: 136

## 2011-02-03 LAB — CULTURE, RESPIRATORY W GRAM STAIN

## 2011-02-03 LAB — CARDIAC PANEL(CRET KIN+CKTOT+MB+TROPI)
CK, MB: 2.3
CK, MB: 2.9
Relative Index: 1.2
Relative Index: 1.4
Troponin I: 0.03
Troponin I: 0.05

## 2011-02-06 ENCOUNTER — Emergency Department (HOSPITAL_COMMUNITY)
Admission: EM | Admit: 2011-02-06 | Discharge: 2011-02-07 | Disposition: A | Discharge status: Home / Self Care | Payer: Medicaid Other | Attending: Emergency Medicine | Admitting: Emergency Medicine

## 2011-02-06 ENCOUNTER — Emergency Department (HOSPITAL_COMMUNITY): Payer: Medicaid Other

## 2011-02-06 DIAGNOSIS — R05 Cough: Secondary | ICD-10-CM | POA: Insufficient documentation

## 2011-02-06 DIAGNOSIS — J40 Bronchitis, not specified as acute or chronic: Secondary | ICD-10-CM | POA: Insufficient documentation

## 2011-02-06 DIAGNOSIS — R0789 Other chest pain: Secondary | ICD-10-CM | POA: Insufficient documentation

## 2011-02-06 DIAGNOSIS — R059 Cough, unspecified: Secondary | ICD-10-CM | POA: Insufficient documentation

## 2011-02-06 DIAGNOSIS — R0989 Other specified symptoms and signs involving the circulatory and respiratory systems: Secondary | ICD-10-CM | POA: Insufficient documentation

## 2011-02-06 DIAGNOSIS — R0602 Shortness of breath: Secondary | ICD-10-CM | POA: Insufficient documentation

## 2011-02-06 DIAGNOSIS — R0609 Other forms of dyspnea: Secondary | ICD-10-CM | POA: Insufficient documentation

## 2011-02-06 DIAGNOSIS — I10 Essential (primary) hypertension: Secondary | ICD-10-CM | POA: Insufficient documentation

## 2011-02-06 DIAGNOSIS — F1410 Cocaine abuse, uncomplicated: Secondary | ICD-10-CM | POA: Insufficient documentation

## 2011-02-06 DIAGNOSIS — I509 Heart failure, unspecified: Secondary | ICD-10-CM | POA: Insufficient documentation

## 2011-02-06 LAB — DIFFERENTIAL
Eosinophils Relative: 2 % (ref 0–5)
Lymphocytes Relative: 30 % (ref 12–46)
Lymphs Abs: 1.6 10*3/uL (ref 0.7–4.0)
Monocytes Absolute: 0.5 10*3/uL (ref 0.1–1.0)
Monocytes Relative: 10 % (ref 3–12)

## 2011-02-06 LAB — URINALYSIS, ROUTINE W REFLEX MICROSCOPIC
Bilirubin Urine: NEGATIVE
Nitrite: NEGATIVE
Protein, ur: NEGATIVE mg/dL
Specific Gravity, Urine: 1.008 (ref 1.005–1.030)
Urobilinogen, UA: 0.2 mg/dL (ref 0.0–1.0)

## 2011-02-06 LAB — RAPID URINE DRUG SCREEN, HOSP PERFORMED
Barbiturates: NOT DETECTED
Cocaine: POSITIVE — AB
Opiates: NOT DETECTED
Tetrahydrocannabinol: NOT DETECTED

## 2011-02-06 LAB — COMPREHENSIVE METABOLIC PANEL
BUN: 13 mg/dL (ref 6–23)
CO2: 23 mEq/L (ref 19–32)
Calcium: 9.5 mg/dL (ref 8.4–10.5)
GFR calc Af Amer: >60 mL/min (ref >60)
GFR calc non Af Amer: >60 mL/min (ref >60)
Glucose, Bld: 97 mg/dL (ref 70–99)
Total Protein: 6.8 g/dL (ref 6.0–8.3)

## 2011-02-06 LAB — TROPONIN I: Troponin I: <0.3 ng/mL (ref <0.30)

## 2011-02-06 LAB — CBC
HCT: 41.4 % (ref 39.0–52.0)
MCH: 28.6 pg (ref 26.0–34.0)
MCHC: 33.8 g/dL (ref 30.0–36.0)
MCV: 84.5 fL (ref 78.0–100.0)
RDW: 13.3 % (ref 11.5–15.5)

## 2011-03-07 ENCOUNTER — Emergency Department (HOSPITAL_COMMUNITY): Payer: Medicaid Other

## 2011-03-07 ENCOUNTER — Inpatient Hospital Stay (HOSPITAL_COMMUNITY)
Admission: EM | Admit: 2011-03-07 | Discharge: 2011-03-11 | DRG: 208 | Disposition: A | Discharge status: Home / Self Care | Payer: Medicaid Other | Source: Ambulatory Visit | Attending: Internal Medicine | Admitting: Internal Medicine

## 2011-03-07 DIAGNOSIS — I428 Other cardiomyopathies: Secondary | ICD-10-CM | POA: Diagnosis present

## 2011-03-07 DIAGNOSIS — J810 Acute pulmonary edema: Secondary | ICD-10-CM

## 2011-03-07 DIAGNOSIS — F1010 Alcohol abuse, uncomplicated: Secondary | ICD-10-CM | POA: Diagnosis present

## 2011-03-07 DIAGNOSIS — J449 Chronic obstructive pulmonary disease, unspecified: Secondary | ICD-10-CM | POA: Diagnosis present

## 2011-03-07 DIAGNOSIS — J9600 Acute respiratory failure, unspecified whether with hypoxia or hypercapnia: Secondary | ICD-10-CM

## 2011-03-07 DIAGNOSIS — I5023 Acute on chronic systolic (congestive) heart failure: Secondary | ICD-10-CM | POA: Diagnosis present

## 2011-03-07 DIAGNOSIS — I10 Essential (primary) hypertension: Secondary | ICD-10-CM | POA: Diagnosis present

## 2011-03-07 DIAGNOSIS — F17200 Nicotine dependence, unspecified, uncomplicated: Secondary | ICD-10-CM | POA: Diagnosis present

## 2011-03-07 DIAGNOSIS — I2510 Atherosclerotic heart disease of native coronary artery without angina pectoris: Secondary | ICD-10-CM | POA: Diagnosis present

## 2011-03-07 DIAGNOSIS — Z9119 Patient's noncompliance with other medical treatment and regimen: Secondary | ICD-10-CM

## 2011-03-07 DIAGNOSIS — F1410 Cocaine abuse, uncomplicated: Secondary | ICD-10-CM | POA: Diagnosis present

## 2011-03-07 DIAGNOSIS — J4489 Other specified chronic obstructive pulmonary disease: Secondary | ICD-10-CM | POA: Diagnosis present

## 2011-03-07 DIAGNOSIS — I509 Heart failure, unspecified: Secondary | ICD-10-CM | POA: Diagnosis present

## 2011-03-07 DIAGNOSIS — Z9911 Dependence on respirator [ventilator] status: Secondary | ICD-10-CM

## 2011-03-07 DIAGNOSIS — R7309 Other abnormal glucose: Secondary | ICD-10-CM | POA: Diagnosis present

## 2011-03-07 DIAGNOSIS — Z91199 Patient's noncompliance with other medical treatment and regimen due to unspecified reason: Secondary | ICD-10-CM

## 2011-03-07 LAB — COMPREHENSIVE METABOLIC PANEL
ALT: 28 U/L (ref 0–53)
AST: 47 U/L — ABNORMAL HIGH (ref 0–37)
Albumin: 3.6 g/dL (ref 3.5–5.2)
Alkaline Phosphatase: 117 U/L (ref 39–117)
CO2: 26 mEq/L (ref 19–32)
Chloride: 105 mEq/L (ref 96–112)
Creatinine, Ser: 1.02 mg/dL (ref 0.50–1.35)
Potassium: 4 mEq/L (ref 3.5–5.1)
Sodium: 143 mEq/L (ref 135–145)
Total Bilirubin: 0.4 mg/dL (ref 0.3–1.2)

## 2011-03-07 LAB — BASIC METABOLIC PANEL
CO2: 26 mEq/L (ref 19–32)
Chloride: 108 mEq/L (ref 96–112)
Potassium: 4.1 mEq/L (ref 3.5–5.1)
Sodium: 143 mEq/L (ref 135–145)

## 2011-03-07 LAB — URINALYSIS, ROUTINE W REFLEX MICROSCOPIC
Glucose, UA: NEGATIVE mg/dL
Leukocytes, UA: NEGATIVE
Nitrite: NEGATIVE
Specific Gravity, Urine: 1.006 (ref 1.005–1.030)
pH: 5.5 (ref 5.0–8.0)

## 2011-03-07 LAB — DIFFERENTIAL
Basophils Relative: 0 % (ref 0–1)
Eosinophils Absolute: 0 10*3/uL (ref 0.0–0.7)
Eosinophils Relative: 0 % (ref 0–5)
Lymphs Abs: 0.8 10*3/uL (ref 0.7–4.0)

## 2011-03-07 LAB — RAPID URINE DRUG SCREEN, HOSP PERFORMED
Amphetamines: NOT DETECTED
Barbiturates: NOT DETECTED
Benzodiazepines: NOT DETECTED
Cocaine: POSITIVE — AB

## 2011-03-07 LAB — MAGNESIUM: Magnesium: 2.1 mg/dL (ref 1.5–2.5)

## 2011-03-07 LAB — POCT I-STAT 3, ART BLOOD GAS (G3+)
Acid-Base Excess: 1 mmol/L (ref 0.0–2.0)
pO2, Arterial: 233 mmHg — ABNORMAL HIGH (ref 80.0–100.0)

## 2011-03-07 LAB — POCT I-STAT TROPONIN I

## 2011-03-07 LAB — CBC
MCH: 29 pg (ref 26.0–34.0)
MCV: 86 fL (ref 78.0–100.0)
Platelets: 155 10*3/uL (ref 150–400)
RDW: 13.7 % (ref 11.5–15.5)
WBC: 11.1 10*3/uL — ABNORMAL HIGH (ref 4.0–10.5)

## 2011-03-07 LAB — MRSA PCR SCREENING: MRSA by PCR: NEGATIVE

## 2011-03-07 LAB — CARDIAC PANEL(CRET KIN+CKTOT+MB+TROPI): Total CK: 178 U/L (ref 7–232)

## 2011-03-07 LAB — PROTIME-INR: Prothrombin Time: 12.7 seconds (ref 11.6–15.2)

## 2011-03-07 LAB — PHOSPHORUS: Phosphorus: 3.9 mg/dL (ref 2.3–4.6)

## 2011-03-07 LAB — ETHANOL: Alcohol, Ethyl (B): <11 mg/dL (ref 0–11)

## 2011-03-07 LAB — GLUCOSE, CAPILLARY: Glucose-Capillary: 55 mg/dL — ABNORMAL LOW (ref 70–99)

## 2011-03-08 ENCOUNTER — Inpatient Hospital Stay (HOSPITAL_COMMUNITY): Payer: Medicaid Other

## 2011-03-08 LAB — POCT I-STAT 3, ART BLOOD GAS (G3+)
Bicarbonate: 26.1 mEq/L — ABNORMAL HIGH (ref 20.0–24.0)
TCO2: 27 mmol/L (ref 0–100)
pCO2 arterial: 46.5 mmHg — ABNORMAL HIGH (ref 35.0–45.0)
pH, Arterial: 7.36 (ref 7.350–7.450)
pO2, Arterial: 60 mmHg — ABNORMAL LOW (ref 80.0–100.0)

## 2011-03-08 LAB — CBC
HCT: 41 % (ref 39.0–52.0)
MCV: 86 fL (ref 78.0–100.0)
Platelets: 153 10*3/uL (ref 150–400)
RBC: 4.77 MIL/uL (ref 4.22–5.81)
WBC: 7.6 10*3/uL (ref 4.0–10.5)

## 2011-03-08 LAB — URINE CULTURE
Colony Count: NO GROWTH
Culture  Setup Time: 201210271308
Culture: NO GROWTH

## 2011-03-08 LAB — PRO B NATRIURETIC PEPTIDE: Pro B Natriuretic peptide (BNP): 1891 pg/mL — ABNORMAL HIGH (ref 0–125)

## 2011-03-08 LAB — BASIC METABOLIC PANEL
CO2: 24 mEq/L (ref 19–32)
Chloride: 108 mEq/L (ref 96–112)
Creatinine, Ser: 0.98 mg/dL (ref 0.50–1.35)

## 2011-03-08 LAB — CARDIAC PANEL(CRET KIN+CKTOT+MB+TROPI)
CK, MB: 3.6 ng/mL (ref 0.3–4.0)
Troponin I: <0.3 ng/mL (ref <0.30)

## 2011-03-09 LAB — BASIC METABOLIC PANEL
BUN: 16 mg/dL (ref 6–23)
Calcium: 9.1 mg/dL (ref 8.4–10.5)
Creatinine, Ser: 0.94 mg/dL (ref 0.50–1.35)
GFR calc non Af Amer: >90 mL/min (ref >90)
Glucose, Bld: 135 mg/dL — ABNORMAL HIGH (ref 70–99)

## 2011-03-10 DIAGNOSIS — F1410 Cocaine abuse, uncomplicated: Secondary | ICD-10-CM

## 2011-03-10 DIAGNOSIS — I509 Heart failure, unspecified: Secondary | ICD-10-CM

## 2011-03-10 DIAGNOSIS — J449 Chronic obstructive pulmonary disease, unspecified: Secondary | ICD-10-CM

## 2011-03-10 DIAGNOSIS — J9600 Acute respiratory failure, unspecified whether with hypoxia or hypercapnia: Secondary | ICD-10-CM

## 2011-03-10 LAB — PRO B NATRIURETIC PEPTIDE: Pro B Natriuretic peptide (BNP): 1452 pg/mL — ABNORMAL HIGH (ref 0–125)

## 2011-03-10 LAB — CBC
MCH: 28.5 pg (ref 26.0–34.0)
MCHC: 34 g/dL (ref 30.0–36.0)
Platelets: 159 10*3/uL (ref 150–400)

## 2011-03-10 LAB — GLUCOSE, CAPILLARY: Glucose-Capillary: 103 mg/dL — ABNORMAL HIGH (ref 70–99)

## 2011-03-10 LAB — BASIC METABOLIC PANEL
Calcium: 8.9 mg/dL (ref 8.4–10.5)
Creatinine, Ser: 0.98 mg/dL (ref 0.50–1.35)
GFR calc Af Amer: >90 mL/min (ref >90)

## 2011-03-12 LAB — CULTURE, RESPIRATORY W GRAM STAIN

## 2011-03-13 LAB — CULTURE, BLOOD (ROUTINE X 2)
Culture  Setup Time: 201210271722
Culture: NO GROWTH
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
## 2011-05-03 ENCOUNTER — Emergency Department (HOSPITAL_COMMUNITY): Payer: Medicaid Other

## 2011-05-03 ENCOUNTER — Inpatient Hospital Stay (HOSPITAL_COMMUNITY)
Admission: EM | Admit: 2011-05-03 | Discharge: 2011-05-05 | DRG: 208 | Disposition: A | Discharge status: Home / Self Care | Payer: Medicaid Other | Attending: Internal Medicine | Admitting: Internal Medicine

## 2011-05-03 ENCOUNTER — Encounter: Payer: Self-pay | Admitting: Emergency Medicine

## 2011-05-03 ENCOUNTER — Inpatient Hospital Stay (HOSPITAL_COMMUNITY): Payer: Medicaid Other

## 2011-05-03 DIAGNOSIS — I5022 Chronic systolic (congestive) heart failure: Secondary | ICD-10-CM

## 2011-05-03 DIAGNOSIS — I2510 Atherosclerotic heart disease of native coronary artery without angina pectoris: Secondary | ICD-10-CM | POA: Diagnosis present

## 2011-05-03 DIAGNOSIS — I10 Essential (primary) hypertension: Secondary | ICD-10-CM | POA: Diagnosis present

## 2011-05-03 DIAGNOSIS — J9600 Acute respiratory failure, unspecified whether with hypoxia or hypercapnia: Secondary | ICD-10-CM

## 2011-05-03 DIAGNOSIS — E876 Hypokalemia: Secondary | ICD-10-CM | POA: Diagnosis present

## 2011-05-03 DIAGNOSIS — Z91199 Patient's noncompliance with other medical treatment and regimen due to unspecified reason: Secondary | ICD-10-CM

## 2011-05-03 DIAGNOSIS — J9690 Respiratory failure, unspecified, unspecified whether with hypoxia or hypercapnia: Secondary | ICD-10-CM

## 2011-05-03 DIAGNOSIS — F1910 Other psychoactive substance abuse, uncomplicated: Secondary | ICD-10-CM | POA: Diagnosis present

## 2011-05-03 DIAGNOSIS — R0902 Hypoxemia: Secondary | ICD-10-CM

## 2011-05-03 DIAGNOSIS — I428 Other cardiomyopathies: Secondary | ICD-10-CM | POA: Diagnosis present

## 2011-05-03 DIAGNOSIS — I161 Hypertensive emergency: Secondary | ICD-10-CM

## 2011-05-03 DIAGNOSIS — F1410 Cocaine abuse, uncomplicated: Secondary | ICD-10-CM | POA: Diagnosis present

## 2011-05-03 DIAGNOSIS — I5023 Acute on chronic systolic (congestive) heart failure: Secondary | ICD-10-CM | POA: Diagnosis present

## 2011-05-03 DIAGNOSIS — I509 Heart failure, unspecified: Secondary | ICD-10-CM | POA: Diagnosis present

## 2011-05-03 DIAGNOSIS — Z9119 Patient's noncompliance with other medical treatment and regimen: Secondary | ICD-10-CM

## 2011-05-03 DIAGNOSIS — J9601 Acute respiratory failure with hypoxia: Secondary | ICD-10-CM

## 2011-05-03 HISTORY — DX: Heart failure, unspecified: I50.9

## 2011-05-03 HISTORY — DX: Essential (primary) hypertension: I10

## 2011-05-03 LAB — URINALYSIS, ROUTINE W REFLEX MICROSCOPIC
Glucose, UA: NEGATIVE mg/dL
Specific Gravity, Urine: 1.026 (ref 1.005–1.030)
Urobilinogen, UA: 1 mg/dL (ref 0.0–1.0)

## 2011-05-03 LAB — DIFFERENTIAL
Basophils Relative: 0 % (ref 0–1)
Eosinophils Absolute: 0.2 10*3/uL (ref 0.0–0.7)
Neutrophils Relative %: 43 % (ref 43–77)

## 2011-05-03 LAB — BASIC METABOLIC PANEL
BUN: 20 mg/dL (ref 6–23)
Calcium: 9.1 mg/dL (ref 8.4–10.5)
GFR calc non Af Amer: >90 mL/min (ref >90)
Glucose, Bld: 114 mg/dL — ABNORMAL HIGH (ref 70–99)

## 2011-05-03 LAB — POCT I-STAT 3, ART BLOOD GAS (G3+)
Bicarbonate: 23.8 mEq/L (ref 20.0–24.0)
TCO2: 25 mmol/L (ref 0–100)
pH, Arterial: 7.271 — ABNORMAL LOW (ref 7.350–7.450)
pO2, Arterial: 119 mmHg — ABNORMAL HIGH (ref 80.0–100.0)

## 2011-05-03 LAB — CBC
MCH: 28.4 pg (ref 26.0–34.0)
MCHC: 32.9 g/dL (ref 30.0–36.0)
Platelets: 217 10*3/uL (ref 150–400)
RDW: 15.3 % (ref 11.5–15.5)

## 2011-05-03 LAB — RAPID URINE DRUG SCREEN, HOSP PERFORMED
Opiates: NOT DETECTED
Tetrahydrocannabinol: NOT DETECTED

## 2011-05-03 LAB — PRO B NATRIURETIC PEPTIDE: Pro B Natriuretic peptide (BNP): 2842 pg/mL — ABNORMAL HIGH (ref 0–125)

## 2011-05-03 LAB — URINE MICROSCOPIC-ADD ON

## 2011-05-03 MED ORDER — ENOXAPARIN SODIUM 40 MG/0.4ML ~~LOC~~ SOLN
40.0000 mg | SUBCUTANEOUS | Status: DC
  Administered 2011-05-03 – 2011-05-05 (×3): 40 mg via SUBCUTANEOUS
  Filled 2011-05-03 (×3): qty 0.4

## 2011-05-03 MED ORDER — PROPOFOL 10 MG/ML IV EMUL
5.0000 ug/kg/min | INTRAVENOUS | Status: DC
  Administered 2011-05-03: 25 ug/kg/min via INTRAVENOUS
  Filled 2011-05-03: qty 100

## 2011-05-03 MED ORDER — NITROGLYCERIN IN D5W 200-5 MCG/ML-% IV SOLN
INTRAVENOUS | Status: AC
  Filled 2011-05-03: qty 250

## 2011-05-03 MED ORDER — CHLORHEXIDINE GLUCONATE 0.12 % MT SOLN
15.0000 mL | Freq: Two times a day (BID) | OROMUCOSAL | Status: DC
  Administered 2011-05-03: 15 mL via OROMUCOSAL

## 2011-05-03 MED ORDER — NITROGLYCERIN IN D5W 200-5 MCG/ML-% IV SOLN
5.0000 ug/min | Freq: Once | INTRAVENOUS | Status: AC
  Administered 2011-05-03: 5 ug/min via INTRAVENOUS

## 2011-05-03 MED ORDER — FENTANYL CITRATE 0.05 MG/ML IJ SOLN
100.0000 ug | Freq: Once | INTRAMUSCULAR | Status: AC
  Administered 2011-05-03: 100 ug via INTRAVENOUS

## 2011-05-03 MED ORDER — FUROSEMIDE 10 MG/ML IJ SOLN
40.0000 mg | Freq: Once | INTRAMUSCULAR | Status: AC
  Administered 2011-05-03: 40 mg via INTRAVENOUS
  Filled 2011-05-03: qty 4

## 2011-05-03 MED ORDER — PROPOFOL 10 MG/ML IV EMUL
5.0000 ug/kg/min | INTRAVENOUS | Status: DC
  Administered 2011-05-03: 20 ug/kg/min via INTRAVENOUS
  Filled 2011-05-03: qty 100
  Filled 2011-05-03: qty 20

## 2011-05-03 MED ORDER — FUROSEMIDE 10 MG/ML IJ SOLN
40.0000 mg | Freq: Once | INTRAMUSCULAR | Status: AC
  Administered 2011-05-03: 40 mg via INTRAVENOUS

## 2011-05-03 MED ORDER — NITROGLYCERIN IN D5W 200-5 MCG/ML-% IV SOLN
5.0000 ug/min | Freq: Once | INTRAVENOUS | Status: AC
  Administered 2011-05-03: 04:00:00 via INTRAVENOUS

## 2011-05-03 MED ORDER — SODIUM CHLORIDE 0.9 % IV SOLN
10.0000 ug/h | INTRAVENOUS | Status: DC
  Filled 2011-05-03: qty 50

## 2011-05-03 MED ORDER — CHLORHEXIDINE GLUCONATE 0.12 % MT SOLN
OROMUCOSAL | Status: AC
  Filled 2011-05-03: qty 15

## 2011-05-03 MED ORDER — FUROSEMIDE 10 MG/ML IJ SOLN
INTRAMUSCULAR | Status: AC
  Filled 2011-05-03: qty 4

## 2011-05-03 MED ORDER — SUCCINYLCHOLINE CHLORIDE 20 MG/ML IJ SOLN
100.0000 mg | Freq: Once | INTRAMUSCULAR | Status: AC
  Administered 2011-05-03: 100 mg via INTRAVENOUS

## 2011-05-03 MED ORDER — SODIUM CHLORIDE 0.9 % IV SOLN
2.0000 mg/h | INTRAVENOUS | Status: DC
  Filled 2011-05-03: qty 10

## 2011-05-03 MED ORDER — ASPIRIN 81 MG PO CHEW
324.0000 mg | CHEWABLE_TABLET | Freq: Once | ORAL | Status: AC
  Administered 2011-05-03: 324 mg via ORAL
  Filled 2011-05-03: qty 4
  Filled 2011-05-03: qty 1

## 2011-05-03 MED ORDER — SODIUM CHLORIDE 0.9 % IV SOLN: 250.0000 mL | INTRAVENOUS | Status: DC | PRN

## 2011-05-03 MED ORDER — ETOMIDATE 2 MG/ML IV SOLN
20.0000 mg | Freq: Once | INTRAVENOUS | Status: AC
  Administered 2011-05-03: 20 mg via INTRAVENOUS

## 2011-05-03 MED ORDER — INFLUENZA VIRUS VACC SPLIT PF IM SUSP
0.5000 mL | INTRAMUSCULAR | Status: AC
  Administered 2011-05-04: 0.5 mL via INTRAMUSCULAR
  Filled 2011-05-03: qty 0.5

## 2011-05-03 MED ORDER — FUROSEMIDE 8 MG/ML PO SOLN
40.0000 mg | ORAL | Status: DC
  Filled 2011-05-03: qty 5

## 2011-05-03 MED ORDER — FAMOTIDINE IN NACL 20-0.9 MG/50ML-% IV SOLN
20.0000 mg | Freq: Two times a day (BID) | INTRAVENOUS | Status: DC
  Administered 2011-05-03 – 2011-05-04 (×3): 20 mg via INTRAVENOUS
  Filled 2011-05-03 (×6): qty 50

## 2011-05-03 MED ORDER — BIOTENE DRY MOUTH MT LIQD
15.0000 mL | OROMUCOSAL | Status: DC
  Administered 2011-05-03 (×3): 15 mL via OROMUCOSAL

## 2011-05-03 MED ORDER — FENTANYL CITRATE 0.05 MG/ML IJ SOLN
INTRAMUSCULAR | Status: AC
  Administered 2011-05-03: 100 ug via INTRAVENOUS
  Filled 2011-05-03: qty 2

## 2011-05-03 NOTE — H&P (Signed)
Patient name: [name] Medical record number: [id number] Date of birth: 08-26-1950 Age: 60 y.o. Gender: male PCP: No primary provider on file.  Date: 05/03/2011 Reason for Consult: Acute hypoxic respiratory failure Referring Physician: ED  Brief history [name] is a 60 year-old gentleman who presented to the ED in respiratory distress.  History of polysubstance abuse, including cocaine.  Lines/tubes ETT 12/23 >>>  Culture data/sepsis markers None  Antibiotics None  Best practice Lovenox Famotidine  Protocols/consults None  Events/studies Intubated 12/23  Interval: Tolerating PSV 5 this am CXR better Has been active but not following commands on low dose propofol  Temp:  [97 F (36.1 C)-97.2 F (36.2 C)] 97 F (36.1 C) (12/23 0800) Pulse Rate:  [69-104] 95  (12/23 0800) Resp:  [13-29] 20  (12/23 0800) BP: (91-202)/(69-133) 133/92 mmHg (12/23 0800) SpO2:  [95 %-100 %] 100 % (12/23 0800) FiO2 (%):  [30 %-100 %] 30 % (12/23 0800) Weight:  [64.5 kg (142 lb 3.2 oz)-70 kg (154 lb 5.2 oz)] 142 lb 3.2 oz (64.5 kg) (12/23 0615)    Intake/Output Summary (Last 24 hours) at 05/03/11 1019 Last data filed at 05/03/11 0800  Gross per 24 hour  Intake  74.24 ml  Output   1630 ml  Net -1555.76 ml    Physical exam Physical Exam  Constitutional: He appears well-developed.  HENT:  Head: Normocephalic and atraumatic.  Nose: Nose normal.  Mouth/Throat: Oropharynx is clear and moist.  Eyes: Conjunctivae are normal. Pupils are equal, round, and reactive to light.  Neck: No JVD present. No thyromegaly present.  Cardiovascular: Normal rate, regular rhythm and normal heart sounds.   Pulmonary/Chest: Breath sounds normal. No stridor.  Abdominal: Soft. Bowel sounds are normal. He exhibits no distension. There is no tenderness. There is no rebound.  Musculoskeletal: Normal range of motion.  Lymphadenopathy:    He has no cervical adenopathy.  Skin: Skin is warm and  dry. No rash noted.    Radiology CXR - Diffuse infiltrates but improved over 12 hours  Vent Mode:  [-] PSV;CPAP FiO2 (%):  [30 %-100 %] 30 % Set Rate:  [16 bmp-22 bmp] 22 bmp Vt Set:  [500 mL] 500 mL PEEP:  [5 cmH20] 5 cmH20 Pressure Support:  [5 cmH20] 5 cmH20 Plateau Pressure:  [13 cmH20] 13 cmH20  LAB RESULT Lab Results  Component Value Date   CREATININE 0.93 05/03/2011   BUN 20 05/03/2011   NA 141 05/03/2011   K 4.2 05/03/2011   CL 109 05/03/2011   CO2 21 05/03/2011   Lab Results  Component Value Date   WBC 9.5 05/03/2011   HGB 14.8 05/03/2011   HCT 45.0 05/03/2011   MCV 86.2 05/03/2011   PLT 217 05/03/2011   Lab Results  Component Value Date   ALT 28 03/07/2011   AST 47* 03/07/2011   ALKPHOS 117 03/07/2011   BILITOT 0.4 03/07/2011   Lab Results  Component Value Date   INR 0.93 03/07/2011   INR 0.97 08/23/2010   INR 0.90 05/23/2010     Assessment and Plan   Acute Respiratory failure, likely flash edema in setting cocaine -repeat lasix 12/23 -Hold on antibiotics for now -goal extubation 12/23  HTN - supposed to be on lisinopril at home, unclear compliance, will restart and titrate to goal BP - follow troponin - TTE ordered, pending  Levy Pupa, MD, PhD 05/03/2011, 10:26 AM  Pulmonary and Critical Care [phone] or if no answer [phone]

## 2011-05-03 NOTE — ED Notes (Signed)
Patient found at home with acute respiratory distress, cocaine residue on face.  Patient with rales all fields.

## 2011-05-03 NOTE — Progress Notes (Signed)
  Echocardiogram 2D Echocardiogram has been performed.  Clide Deutscher RDCS 05/03/2011, 12:43 PM

## 2011-05-03 NOTE — ED Notes (Signed)
Patient in via [facility] with acute respiratory distress after using cocaine.  Cocaine residue on face noted by EMS.  Patient using accessory muscle use, labored and tachypneic breathing, rales through all fields.  Patient on CPAP via EMS.

## 2011-05-03 NOTE — ED Notes (Signed)
Foley inserted, urine clear yellow

## 2011-05-03 NOTE — ED Provider Notes (Signed)
History     CSN: [id number]  Arrival date & time 05/03/11  0300   First MD Initiated Contact with Patient 05/03/11 [phone]      No chief complaint on file.    Patient is a 60 y.o. male presenting with shortness of breath. The history is provided by the EMS personnel. The history is limited by the condition of the patient.  Shortness of Breath  The current episode started today. The onset was sudden. The problem is severe. The symptoms are relieved by nothing. The symptoms are aggravated by nothing. Associated symptoms include shortness of breath.  pt seen on arrival He was brought in by EMS The report is that he used cocaine, had onset of chest pain and shortness of breath EMS reports that patient was very SOB, hypertensive and placed on cpap They report he had rales bilaterally No other details are known  PMH - CHF  No past surgical history on file.  No family history on file.  History  Substance Use Topics  . Smoking status: Not on file  . Smokeless tobacco: Not on file  . Alcohol Use: Not on file      Review of Systems  Unable to perform ROS: Unstable vital signs  Respiratory: Positive for shortness of breath.     Allergies  Review of patient's allergies indicates not on file.  Home Medications  No current outpatient prescriptions on file.  Ht 5\' 10"  (1.778 m)  Wt 154 lb 5.2 oz (70 kg)  BMI 22.14 kg/m2 BP 117/87  Pulse 98  Ht 5\' 10"  (1.778 m)  Wt 154 lb 5.2 oz (70 kg)  BMI 22.14 kg/m2  SpO2 100%   Physical Exam CONSTITUTIONAL: he is in severe distress HEAD AND FACE: Normocephalic/atraumatic EYES: EOMI/PERRL ENMT: Mucous membranes dry, CPAP mask in place NECK: supple no meningeal signs SPINE:entire spine nontender CV: tachycardia LUNGS: tachypnea, rales bilaterally ABDOMEN: soft, nontender, no rebound or guarding NEURO: Pt is awake/alert, moves all extremitiesx4, very agitated and confused EXTREMITIES: pulses normal, full ROM SKIN: warm, color  normal PSYCH: anxious  ED Course  INTUBATION Date/Time: 05/03/2011 3:26 AM Performed by: Joya Gaskins Authorized by: Joya Gaskins Indications: respiratory distress and airway protection Intubation method: video-assisted Patient status: paralyzed (RSI) Sedatives: etomidate Paralytic: succinylcholine Laryngoscope size: Mac 4 Tube size: 7.5 mm Tube type: cuffed Number of attempts: 1 Cricoid pressure: yes Cords visualized: yes Post-procedure assessment: chest rise and CO2 detector Breath sounds: equal and absent over the epigastrium Cuff inflated: yes ETT to lip: 23 cm Tube secured with: ETT holder Patient tolerance: Patient tolerated the procedure well with no immediate complications. Comments: Unable to obtain consent as patient in severe distress  OG placement Date/Time: 05/03/2011 3:27 AM Performed by: Joya Gaskins Authorized by: Joya Gaskins Patient sedated: yes Sedatives: etomidate Vitals: Vital signs were monitored during sedation. Patient tolerance: Patient tolerated the procedure well with no immediate complications. Comments: OG tube placed s/p intubation No immediate complications Confirmed by auscultation    CRITICAL CARE Performed by: Joya Gaskins   Total critical care time: 39  Critical care time was exclusive of separately billable procedures and treating other patients.  Critical care was necessary to treat or prevent imminent or life-threatening deterioration.  Critical care was time spent personally by me on the following activities: development of treatment plan with patient and/or surrogate as well as nursing, discussions with consultants, evaluation of patient's response to treatment, examination of patient, obtaining history from patient or surrogate,  ordering and performing treatments and interventions, ordering and review of laboratory studies, ordering and review of radiographic studies, pulse oximetry and  re-evaluation of patient's condition.    Labs Reviewed  BASIC METABOLIC PANEL  CBC  DIFFERENTIAL  I-STAT TROPONIN I  PRO B NATRIURETIC PEPTIDE  BLOOD GAS, ARTERIAL  URINE RAPID DRUG SCREEN (HOSP PERFORMED)  URINALYSIS, ROUTINE W REFLEX MICROSCOPIC  URINE CULTURE   3:25 AM Pt seen on arrival and intubated on arrival Pt stabilized Suspect acute pulm edema s/p cocaine use  3:52 AM D/w PCCM, will admit Pt stabilized s/p intubation   4:39 AM D/w PCCM, ok to place bed request ET tube advanced per RT   MDM  Nursing notes reviewed and considered in documentation All labs/vitals reviewed and considered xrays reviewed and considered Previous records reviewed and considered        Date: 05/03/2011  Rate: 89  Rhythm: normal sinus rhythm  QRS Axis: right  Intervals: normal  ST/T Wave abnormalities: nonspecific ST changes  Conduction Disutrbances:none  Narrative Interpretation:   Old EKG Reviewed: none available at time of interpretation    Joya Gaskins, MD 05/03/11 [phone]

## 2011-05-03 NOTE — H&P (Signed)
Patient name: [name] Medical record number: [id number] Date of birth: 1951/01/05 Age: 60 y.o. Gender: male PCP: No primary provider on file.  Date: 05/03/2011 Reason for Consult: Acute hypoxic respiratory failure Referring Physician: ED  Brief history [name] is a 60 year-old gentleman who presented to the ED in respiratory distress.  History of polysubstance abuse, including cocaine.  Lines/tubes ETT 12/23 >>>  Culture data/sepsis markers None  Antibiotics None  Best practice Lovenox Famotidine  Protocols/consults None  Events/studies Intubated 12/23  HPI: [name] is a 60 year-old gentleman with a history of cocaine abuse who presented to the ED in respiratory distress.  He was quickly intubated, and we were consulted for further evaluation.  No other history could be obtained from the patient, and the family was not there.  Past Medical History  Diagnosis Date  . Hypertension   . CHF (congestive heart failure)     History reviewed. No pertinent past surgical history.  History reviewed. No pertinent family history.  Social History:  does not have a smoking history on file. He does not have any smokeless tobacco history on file. He reports that he uses illicit drugs (Cocaine). His alcohol history not on file.  Allergies: No Known Allergies  Medications:  Prior to Admission medications   Not on File    Review of systems not obtained due to patient factors.  Pulse Rate:  [98] 98  (12/23 0325) Resp:  [17-29] 17  (12/23 0551) BP: (97-202)/(74-133) 98/77 mmHg (12/23 0551) SpO2:  [95 %-100 %] 95 % (12/23 0551) FiO2 (%):  [100 %] 100 % (12/23 0405) Weight:  [70 kg (154 lb 5.2 oz)] 154 lb 5.2 oz (70 kg) (12/23 0300)   No intake or output data in the 24 hours ending 05/03/11 0865  Physical exam Physical Exam  Constitutional: He appears well-developed.  HENT:  Head: Normocephalic and atraumatic.  Nose: Nose normal.  Mouth/Throat: Oropharynx  is clear and moist.  Eyes: Conjunctivae are normal. Pupils are equal, round, and reactive to light.  Neck: No JVD present. No thyromegaly present.  Cardiovascular: Normal rate, regular rhythm and normal heart sounds.   Pulmonary/Chest: Breath sounds normal. No stridor.  Abdominal: Soft. Bowel sounds are normal. He exhibits no distension. There is no tenderness. There is no rebound.  Musculoskeletal: Normal range of motion.  Lymphadenopathy:    He has no cervical adenopathy.  Skin: Skin is warm and dry. No rash noted.    Radiology CXR - Diffuse infiltrates  Vent Mode:  [-] PRVC FiO2 (%):  [100 %] 100 % Set Rate:  [16 bmp-22 bmp] 22 bmp Vt Set:  [500 mL] 500 mL PEEP:  [5 cmH20] 5 cmH20  LAB RESULT Lab Results  Component Value Date   CREATININE 0.93 05/03/2011   BUN 20 05/03/2011   NA 141 05/03/2011   K 4.2 05/03/2011   CL 109 05/03/2011   CO2 21 05/03/2011   Lab Results  Component Value Date   WBC 9.5 05/03/2011   HGB 14.8 05/03/2011   HCT 45.0 05/03/2011   MCV 86.2 05/03/2011   PLT 217 05/03/2011   Lab Results  Component Value Date   ALT 28 03/07/2011   AST 47* 03/07/2011   ALKPHOS 117 03/07/2011   BILITOT 0.4 03/07/2011   Lab Results  Component Value Date   INR 0.93 03/07/2011   INR 0.97 08/23/2010   INR 0.90 05/23/2010     Assessment and Plan  Principal Problem:  *Acute respiratory failure  with hypoxia Active Problems:  SUBSTANCE ABUSE, MULTIPLE  HYPERTENSION  CAD  Respiratory failure -Lungs sound surprisingly clear.  I wonder if this wasn't cocaine-induced flash pulmonary edema, which would explain why it got better with just positive pressure and a little diuresis. -Hold on antibiotics -Watch blood pressures.  Lorain Fettes J 05/03/2011, 5:00 AM

## 2011-05-03 NOTE — Progress Notes (Signed)
Patient is a 60 year old male who overdosed.Patient is being treated in Emergency Dept. But will be transferred to a ICU unit. No family has shown up for patient.

## 2011-05-03 NOTE — Procedures (Signed)
Extubation Procedure Note  Patient Details:   Name: [name] DOB: 01-01-1951 MRN: [id number]   Airway Documentation:    Patient extubated per MD order and placed on 3L [place], sats 96%.  Pt has a strong non-productive cough.  He is able to verbalize, post extubation.  No respiratory distress or stridor noted.  RT will monitor.   Evaluation  O2 sats: stable throughout Complications: No apparent complications Patient did tolerate procedure well. Bilateral Breath Sounds: Rhonchi;Clear Suctioning: Airway Yes  [place] 05/03/2011, 12:56 PM

## 2011-05-03 NOTE — ED Notes (Signed)
Patient intubated at this time with 20mg  of Etomidate, 100mg  of Succs

## 2011-05-04 ENCOUNTER — Inpatient Hospital Stay (HOSPITAL_COMMUNITY): Payer: Medicaid Other

## 2011-05-04 DIAGNOSIS — F1410 Cocaine abuse, uncomplicated: Secondary | ICD-10-CM

## 2011-05-04 DIAGNOSIS — I5023 Acute on chronic systolic (congestive) heart failure: Secondary | ICD-10-CM

## 2011-05-04 DIAGNOSIS — J810 Acute pulmonary edema: Secondary | ICD-10-CM

## 2011-05-04 LAB — CARDIAC PANEL(CRET KIN+CKTOT+MB+TROPI)
CK, MB: 3.1 ng/mL (ref 0.3–4.0)
Relative Index: 1.6 (ref 0.0–2.5)
Total CK: 190 U/L (ref 7–232)
Troponin I: <0.3 ng/mL (ref <0.30)

## 2011-05-04 LAB — CBC
HCT: 42.8 % (ref 39.0–52.0)
Hemoglobin: 14 g/dL (ref 13.0–17.0)
MCHC: 32.7 g/dL (ref 30.0–36.0)
MCV: 85.1 fL (ref 78.0–100.0)
RDW: 15.2 % (ref 11.5–15.5)

## 2011-05-04 LAB — URINE CULTURE: Culture: NO GROWTH

## 2011-05-04 LAB — BASIC METABOLIC PANEL
BUN: 15 mg/dL (ref 6–23)
CO2: 25 mEq/L (ref 19–32)
Calcium: 8.9 mg/dL (ref 8.4–10.5)
GFR calc non Af Amer: 90 mL/min — ABNORMAL LOW (ref >90)
Glucose, Bld: 126 mg/dL — ABNORMAL HIGH (ref 70–99)

## 2011-05-04 LAB — PRO B NATRIURETIC PEPTIDE: Pro B Natriuretic peptide (BNP): 1944 pg/mL — ABNORMAL HIGH (ref 0–125)

## 2011-05-04 MED ORDER — FUROSEMIDE 40 MG PO TABS
40.0000 mg | ORAL_TABLET | Freq: Every day | ORAL | Status: DC
  Administered 2011-05-04 – 2011-05-05 (×2): 40 mg via ORAL
  Filled 2011-05-04 (×2): qty 1

## 2011-05-04 MED ORDER — ASPIRIN 81 MG PO CHEW
81.0000 mg | CHEWABLE_TABLET | Freq: Every day | ORAL | Status: DC
  Administered 2011-05-04 – 2011-05-05 (×2): 81 mg via ORAL
  Filled 2011-05-04 (×2): qty 1

## 2011-05-04 MED ORDER — LISINOPRIL 2.5 MG PO TABS
2.5000 mg | ORAL_TABLET | Freq: Every day | ORAL | Status: DC
  Administered 2011-05-04: 2.5 mg via ORAL
  Filled 2011-05-04 (×2): qty 1

## 2011-05-04 MED ORDER — POTASSIUM CHLORIDE 10 MEQ/100ML IV SOLN
10.0000 meq | INTRAVENOUS | Status: DC
  Administered 2011-05-04: 10 meq via INTRAVENOUS
  Filled 2011-05-04: qty 100

## 2011-05-04 MED ORDER — LISINOPRIL 2.5 MG PO TABS
2.5000 mg | ORAL_TABLET | Freq: Every day | ORAL | Status: DC
  Filled 2011-05-04: qty 1

## 2011-05-04 MED ORDER — SIMVASTATIN 40 MG PO TABS
40.0000 mg | ORAL_TABLET | Freq: Every day | ORAL | Status: DC
  Administered 2011-05-04: 40 mg via ORAL
  Filled 2011-05-04 (×3): qty 1

## 2011-05-04 MED ORDER — POTASSIUM CHLORIDE CRYS ER 20 MEQ PO TBCR
40.0000 meq | EXTENDED_RELEASE_TABLET | ORAL | Status: AC
  Administered 2011-05-04 (×2): 40 meq via ORAL
  Filled 2011-05-04 (×2): qty 2

## 2011-05-04 MED FILL — Etomidate IV Soln 2 MG/ML: INTRAVENOUS | Qty: 10 | Status: AC

## 2011-05-04 NOTE — Progress Notes (Signed)
CM consult for medication assistance, pt eligible to assistance, however no d/c date determined. Will follow and assist with 3 days supply of meds and "needy meds" application when d/c meds determined and orders written.  Johny Shock RN MPH Case Manager [phone]

## 2011-05-04 NOTE — Plan of Care (Signed)
Problem: Phase I Progression Outcomes Goal: EF % per last Echo/documented,Core Reminder form on chart Outcome: Completed/Met Date Met:  05/04/11 20-25%     

## 2011-05-04 NOTE — Progress Notes (Signed)
INITIAL ADULT NUTRITION ASSESSMENT Date: 05/04/2011   Time: 10:13 AM  Reason for Assessment: Consult  ASSESSMENT: Male 60 y.o.  Dx: Acute respiratory failure with hypoxia  Hx:  Past Medical History  Diagnosis Date  . Hypertension   . CHF (congestive heart failure)     Related Meds:     . chlorhexidine      . enoxaparin  40 mg Subcutaneous Q24H  . famotidine (PEPCID) IV  20 mg Intravenous Q12H  . furosemide      . furosemide  40 mg Intravenous Once  . furosemide  40 mg Oral Daily  . influenza  inactive virus vaccine  0.5 mL Intramuscular Tomorrow-1000  . potassium chloride  40 mEq Oral Q4H  . DISCONTD: antiseptic oral rinse  15 mL Mouth Rinse Q4H  . DISCONTD: chlorhexidine  15 mL Mouth/Throat BID  . DISCONTD: potassium chloride  10 mEq Intravenous Q1 Hr x 4     Ht: 5\' 10"  (177.8 cm)  Wt: 137 lb 9.1 oz (62.4 kg)  Ideal Wt: 75.5 % Ideal Wt: 83%  Usual Wt: ~ 137 % Usual Wt: 100%  Body mass index is 19.74 kg/(m^2).  Food/Nutrition Related Hx: Patient reports no known weight loss, but also does not weight regularly, patient reports his clothes still fit the same. Patient reports normal appetite PTA.   Labs:  CMP     Component Value Date/Time   NA 137 05/04/2011 0432   K 3.1* 05/04/2011 0432   CL 103 05/04/2011 0432   CO2 25 05/04/2011 0432   GLUCOSE 126* 05/04/2011 0432   BUN 15 05/04/2011 0432   CREATININE 0.96 05/04/2011 0432   CALCIUM 8.9 05/04/2011 0432   PROT 7.1 03/07/2011 0730   ALBUMIN 3.6 03/07/2011 0730   AST 47* 03/07/2011 0730   ALT 28 03/07/2011 0730   ALKPHOS 117 03/07/2011 0730   BILITOT 0.4 03/07/2011 0730   GFRNONAA 90* 05/04/2011 0432   GFRAA >90 05/04/2011 0432    I/O last 3 completed shifts: In: 1194.1 [P.O.:960; I.V.:204.1; NG/GT:30] Out: 4450 [Urine:4450]   Diet Order: Cardiac  Supplements/Tube Feeding: n/a  IVF:    DISCONTD: propofol Last Rate: Stopped (05/03/11 1022)    Estimated Nutritional  Needs:   Kcal:1800-2000 Protein:90-100 gm Fluid: 1.8 -2 L  Patient diet advanced from CL to cardiac this morning. Patient is eager to eat, appetite is normal. Patient denies needs for supplements at this time.   NUTRITION DIAGNOSIS: -Inadequate oral intake (NI-2.1).  Status: Ongoing  RELATED TO: inability to eat  AS EVIDENCE BY: recent CL and NPO diet status, no meals eaten yet on cardiac diet.   MONITORING/EVALUATION(Goals): Goal: PO intake will meet 90-100% estimated nutrition needs. Monitor: PO intake, weight trends, labs, I/O's  EDUCATION NEEDS: -No education needs identified at this time  INTERVENTION: No intervention at this time. RD to follow for additional needs.   Dietitian [phone]  DOCUMENTATION CODES Per approved criteria  -Not Applicable    Clarene Duke MARIE 05/04/2011, 10:13 AM

## 2011-05-04 NOTE — Progress Notes (Signed)
eLink Physician-Brief Progress Note Patient Name: [name] DOB: 04/17/1951 MRN: [id number]  Date of Service  05/04/2011   HPI/Events of Note  Hypokalemia   eICU Interventions  Potassium replaced   Intervention Category Intermediate Interventions: Electrolyte abnormality - evaluation and management  Osmel Dykstra 05/04/2011, 6:56 AM

## 2011-05-04 NOTE — Consult Note (Signed)
CARDIOLOGY CONSULT NOTE   Patient ID: [name] MRN: [id number] DOB/AGE: 1951-02-26 60 y.o.  Admit date: 05/03/2011  Primary Physician   ?[facility] Primary Cardiologist   None  Reason for Consultation   Low EF  [name] is a 60 y.o. male with a history of non-obstructive CAD by cath in January 2012. He is felt to have NICM with LVD out of proportion to his CAD. He was admitted with SOB, acute respiratory failure - intubated overnight but now extubated and cardiology is asked to evaluate him for left ventricular dysfunction.  He says he was taking meds as prescribed by the clinic. The patient states that because of concern about his his kidney function, his Lasix was decreased from 40 mg daily to 20 mg daily. The patient feels that his volume status was well maintained on 40 mg a day but once he was decreased to 20 mg daily, he had problems with increasing shortness of breath. Finally, his shortness of breath worsened to the point that he came to the emergency room. He denies edema and states that when he gets volume overload, his only symptom is increasing shortness of breath. He gets occasional episodes of chest pain. These are exertional. He is not sure when the last episode of chest pain occurred. He has not noted an increase in frequency of the chest pain episodes since his Lasix was decreased. Since admission, his breathing is greatly improved. He is glad to be off the ventilator and have a T-tube out of his throat. He is hungry.    Past Medical History  Diagnosis Date  . Hypertension    Left mainstem:  There is mild-to-moderate calcification of the left main.  The left main is widely patent and divides into the LAD and left circumflex.      LAD:  The LAD is heavily calcified throughout the proximal and midportions.  The proximal LAD is patent at the origin of the first septal perforator.  There is a diffuse 50% lesion through that area of the LAD.  This also  involves the origins of the first two diagonal branches.  Both diagonals are patent throughout their course.  Further   down in the mid and distal LAD, there is irregularity and diffuse plaquing without high-grade stenosis.      Left circumflex:  The first OM is very large and has no significant obstructive disease.  The OM does have diffuse luminal irregularities. The AV groove circumflex is much smaller and supplies small OM-2 and 3 branches, both of which have diffuse disease without critical obstruction.      Right coronary artery.  The RCA is a large, dominant vessel.  There is diffuse plaquing throughout the proximal, mid, and distal vessel without high-grade obstruction.  The PDA also has diffuse disease.  The distal RCA before the origin of the PDA has a 50% to 70% stenosis.  The PDA has scattered 50% stenoses.  The posterolateral branch, which is very small, has a subtotal occlusion.      FINAL ASSESSMENT:   1. Diffuse, moderate nonobstructive coronary artery disease as outlined above.   2. Known severe left ventricular dysfunction.      RECOMMENDATIONS:  The patient has a severe cardiomyopathy out of proportion to the extent of his coronary artery disease.  I would recommend ongoing medical therapy.     05/24/2010   NICM - EF 25% by echo January 2012     COPD  tobacco   . Chronic systolic CHF (congestive heart failure)     Past Surgical History  Procedure Date    cardiac catheterization    . Joint replacement after trauma  L knee    No Known Allergies I have reviewed the patient's current medications. Scheduled:  . chlorhexidine      . enoxaparin  40 mg Subcutaneous Q24H  . famotidine (PEPCID) IV  20 mg Intravenous Q12H  . furosemide      . furosemide  40 mg Intravenous Once  . furosemide  40 mg Oral Daily  . influenza  inactive virus vaccine  0.5 mL Intramuscular Tomorrow-1000  . potassium chloride  40 mEq Oral Q4H    History   Social History  . Marital Status:  Divorced    Spouse Name: N/A    Number of Children: N/A  . Years of Education: N/A   Occupational History  . Not on file.   Social History Main Topics  . Smoking status: Current Everyday Smoker -- 0.5-1 packs/day for 30 years  . Smokeless tobacco: Never Used  . Alcohol Use: Denied abuse  . Drug Use: 1 per week    Special: Cocaine  . Sexually Active:    Other Topics Concern  . Unemployed janitor   Family History  Problem Relation Age of Onset   Died in her 72's with a CVA, no CAD Mother    Died in his 57's, no Hx CAD Father   . Malignant hyperthermia Mother      ROS: He does not weigh himself every day. He does not know how much weight he had gained with his increasing shortness of breath. He gets an occasional arthralgias and joint pains but these can be significant. He denies hematemesis, hemoptysis or melena. He denies presyncope or syncope. He does not have palpitations. He has developed orthopnea and PND with the increasing dyspnea on exertion but does not routinely have those. He has not had any recent illnesses fevers or chills. Full 14 point review of systems complete and found to be negative unless listed  above  Physical Exam: Blood pressure 116/82, pulse 80, temperature 98.4 F (36.9 C), temperature source Oral, resp. rate 17, height 5\' 10"  (1.778 m), weight 137 lb 9.1 oz (62.4 kg), SpO2 99.00%.   General: Well developed, well nourished, in no acute distress Head: Eyes PERRLA, No xanthomas.   Normocephalic and atraumatic, oropharynx without edema or exudate.  Lungs: Rales in the bases, left greater than right but good air exchange without crackles or wheeze. Heart: HRRR S1 S2, no rub/gallop, no Murmur. pulses are 2+ & equal all 4 extrem.   Neck:  No carotid bruit.   No lymphadenopathy.  JVD is at about 10 cm. Abdomen: Bowel sounds present, abdomen soft and non-tender without masses or hernias noted. Msk:  No spine or cva tenderness. Normal strength and tone for age,  no joint deformities or effusions. Extremities: No clubbing or cyanosis. No edema.  Neuro: Alert and oriented X 3. No focal deficits noted. Psych:  Good affect, responds appropriately Skin :  No rashes or lesions noted.  Labs:   Lab Results  Component Value Date   WBC 5.6 05/04/2011   HGB 14.0 05/04/2011   HCT 42.8 05/04/2011   MCV 85.1 05/04/2011   PLT 209 05/04/2011    Lab 05/04/11 0432  NA 137  K 3.1*  CL 103  CO2 25  BUN 15  CREATININE 0.96  CALCIUM 8.9  PROT --  BILITOT --  ALKPHOS --  ALT --  AST --  GLUCOSE 126*    Basename 05/04/11 0432  CKTOTAL 190  CKMB 3.1  TROPONINI <0.30   UDS: + Cocaine  Echo: Study Conclusions 05/03/2011 - Left ventricle: Borderline dilated LV. There is an apical false tendon (normal variant). Wall thickness was normal. Systolic function was severely reduced. The estimated ejection fraction was in the range of 20% to 25%. There is severe inferior, inferoseptal, distal septal, lateraland apical akinesis consistent with large dominant LCx or RCA/LCx territory infarct. Doppler parameters are consistent with restrictive left ventricular relaxation (grade3 diastolic dysfunction). The E/A ratio is >2. The E/e' ratio is >25, suggesting markedly elevated LV filling pressure. The MV decel time is 151 msec. - Mitral valve: Mildly thickened leaflets . Mild regurgitation. - Left atrium: Severely dilated (50 ml/m2). - Inferior vena cava: The vessel was dilated; the respirophasic diameter changes were blunted (< 50%); findings are consistent with elevated central venous pressure -however, the patient is on positive pressure ventilation. - Pericardium, extracardiac: A trivial pericardial effusion was identified.  Radiology:   Dg Chest Port 1 View 05/04/2011  *RADIOLOGY REPORT*  Clinical Data: Weakness, shortness of breath  PORTABLE CHEST - 1 VIEW  Comparison: 05/03/2011; 03/08/2011  Findings:  Grossly unchanged enlarged cardiac  silhouette and mediastinal contours.  Interval extubation.  The lungs remain hyperinflated with bilateral bullous emphysematous change.  No definite pneumothorax.  No definite pleural effusion.  Overall improved aeration of the lungs with increased conspicuity of the pulmonary vasculature.  Query mild persistent pulmonary edema.  Unchanged bones.   IMPRESSION: 1.  Interval extubation.  No pneumothorax.  2.  Overall improved aeration of the lungs with findings suggestive of mild persistent pulmonary edema.  Original Report Authenticated By: Waynard Reeds, M.D.   Dg Chest Port 1 View 05/03/2011  *RADIOLOGY REPORT*  Clinical Data: Endotracheal tube placement and orogastric tube placement.  Crack cocaine overdose, with shortness of breath.  PORTABLE CHEST - 1 VIEW  Comparison: Chest radiograph performed 03/08/2011  Findings: The patient's endotracheal tube is seen ending 7 cm above the carina.  This could be advanced 3-4 cm, as deemed clinically appropriate.  An enteric tube is noted extending to the inferiormost aspect of the image, at the level of the diaphragm.  Lucency at the right lung apex is thought to reflect a focal bleb. Bilateral central airspace opacities are compatible with pulmonary edema.  No definite pleural effusion is identified, though the costophrenic angles are incompletely imaged on this study.  No pneumothorax is seen.  The cardiomediastinal silhouette is borderline normal in size.  No acute osseous abnormalities are identified.  IMPRESSION:  1.  Endotracheal tube seen ending 7 cm above the carina; this could be advanced 3-4 cm, as deemed clinically appropriate. 2.  Bilateral central airspace opacities, compatible with pulmonary edema.  Original Report Authenticated By: Tonia Ghent, M.D.    EKG: SR, Q waves inferiorly that are seen on old ECGs  ASSESSMENT AND PLAN:   The patient was seen today by Dr Jens Som, the patient evaluated and the data reviewed. He is a 60 year old male with  known coronary artery disease but he has a nonischemic cardiomyopathy out of proportion to the level of his coronary artery disease. He has known severe left ventricular dysfunction. According to the patient he is compliant with his medications, but Lasix is not listed as a home med. He has responded well to diuresis and his respiratory status is significantly improved  from admission. His cardiac enzymes were not elevated.  At this time, he can be restarted on a low dose of an ACE inhibitor. He has been appropriately changed to oral Lasix. A beta blocker is indicated but we will hold off on this for now with his urine drug screen positive for cocaine this admission. His EF, although low, is consistent with his previous EF by echocardiogram and cardiac catheterization. No further ischemic workup is indicated at this time. He is also not a candidate for an ICD.  We will continue to follow him closely with you.   Signed: Theodore Demark 05/04/2011, 10:31 AM Co-Sign MD As above, Patient seen and examined and agree;  Briefly patient is a 60 year old male with a past medical history of coronary artery disease, nonischemic cardiomyopathy out of proportion to coronary disease, hypertension and substance abuse for evaluation of acute on chronic systolic congestive heart failure. Patient has had previous cardiac catheterization demonstrating moderate coronary disease. His ejection fraction is severely reduced. Patient was admitted on December 23 with flash pulmonary edema following cocaine use. The patient states he became suddenly short of breath. He had mild chest tightness that increased with inspiration. He also states his Lasix was reduced from 40 to 20 mg daily recently. Patient has been extubated. One set of cardiac markers negative. His presentation is consistent with flash pulmonary edema related to cocaine use. He has a known nonischemic cardiomyopathy. I do not think further ischemia evaluation is  indicated. I agree with Lasix 40 mg daily. I would aspirin 81 mg daily and lisinopril 2.5 mg daily. Increase as tolerated by blood pressure. I will not add a beta blocker because of cocaine use. He would also be a good candidate for a statin in the future. His biggest issue is continued cocaine use. I stressed the importance of avoiding this a compliance with medications. Olga Millers

## 2011-05-04 NOTE — Progress Notes (Signed)
  Patient name: [name] Medical record number: [id number] Date of birth: [birth year] Age: 60 y.o. Gender: male PCP: No primary provider on file.  Date: 05/04/2011 Reason for Consult: Acute hypoxic respiratory failure Referring Physician: ED  Brief history Mr. [name] is a 60 year-old gentleman who presented to the ED in respiratory distress.  History of polysubstance abuse, including cocaine.  Lines/tubes ETT 12/23 >>>12/24  Culture data/sepsis markers None  Antibiotics None  Best practice Lovenox Famotidine  Protocols/consults None  Events/studies Intubated 12/23  Interval: Hungry Kcl burns IV My doctor cut down my lasix Admits to cocaine use Not voided x 6h  Temp:  [97.5 F (36.4 C)-99.7 F (37.6 C)] 98.4 F (36.9 C) (12/24 0400) Pulse Rate:  [64-85] 64  (12/24 0900) Resp:  [13-22] 19  (12/24 0900) BP: (103-147)/(64-93) 130/88 mmHg (12/24 0900) SpO2:  [78 %-100 %] 97 % (12/24 0900) FiO2 (%):  [30 %] 30 % (12/23 1200) Weight:  [62.4 kg (137 lb 9.1 oz)] 137 lb 9.1 oz (62.4 kg) (12/24 0500)    Intake/Output Summary (Last 24 hours) at 05/04/11 7829 Last data filed at 05/04/11 0900  Gross per 24 hour  Intake 1639.85 ml  Output   2820 ml  Net -1180.15 ml    Physical exam Physical Exam  Constitutional: He appears well-developed.  HENT:  Head: Normocephalic and atraumatic.  Nose: Nose normal.  Mouth/Throat: Oropharynx is clear and moist.  Eyes: Conjunctivae are normal. Pupils are equal, round, and reactive to light.  Neck: No JVD present. No thyromegaly present.  Cardiovascular: Normal rate, regular rhythm and normal heart sounds.   Pulmonary/Chest: Breath sounds normal. No stridor.  Abdominal: Soft. Bowel sounds are normal. He exhibits no distension. There is no tenderness. There is no rebound.  Musculoskeletal: Normal range of motion.  Lymphadenopathy:    He has no cervical adenopathy.  Skin: Skin is warm and dry. No rash noted.     Radiology CXR - Diffuse infiltrates but improved over 12 hours  Vent Mode:  [-]  FiO2 (%):  [30 %] 30 %  LAB RESULT Lab Results  Component Value Date   CREATININE 0.96 05/04/2011   BUN 15 05/04/2011   NA 137 05/04/2011   K 3.1* 05/04/2011   CL 103 05/04/2011   CO2 25 05/04/2011   Lab Results  Component Value Date   WBC 5.6 05/04/2011   HGB 14.0 05/04/2011   HCT 42.8 05/04/2011   MCV 85.1 05/04/2011   PLT 209 05/04/2011   Lab Results  Component Value Date   ALT 28 03/07/2011   AST 47* 03/07/2011   ALKPHOS 117 03/07/2011   BILITOT 0.4 03/07/2011   Lab Results  Component Value Date   INR 0.93 03/07/2011   INR 0.97 08/23/2010   INR 0.90 05/23/2010     Assessment and Plan   Acute Respiratory failure, likely flash edema in setting cocaine -extubated -NO antibiotics  -daily lasix (not listed on home med - but pt states)  HTN - supposed to be on lisinopril at home, unclear compliance, will restart and titrate to goal BP -  Troponin neg -echo -EF 25%, cards called Transfer to tele  [name] V.  05/04/2011, 9:52 AM [facility] [phone] or if no answer [phone]

## 2011-05-05 DIAGNOSIS — I5023 Acute on chronic systolic (congestive) heart failure: Secondary | ICD-10-CM

## 2011-05-05 DIAGNOSIS — I5022 Chronic systolic (congestive) heart failure: Secondary | ICD-10-CM

## 2011-05-05 LAB — BASIC METABOLIC PANEL
GFR calc Af Amer: >90 mL/min (ref >90)
GFR calc non Af Amer: 78 mL/min — ABNORMAL LOW (ref >90)
Potassium: 3.6 mEq/L (ref 3.5–5.1)
Sodium: 138 mEq/L (ref 135–145)

## 2011-05-05 MED ORDER — FUROSEMIDE 40 MG PO TABS: 40.0000 mg | ORAL_TABLET | Freq: Every day | ORAL | Status: DC

## 2011-05-05 MED ORDER — ASPIRIN 81 MG PO CHEW: 81.0000 mg | CHEWABLE_TABLET | Freq: Every day | ORAL | Status: AC

## 2011-05-05 MED ORDER — LISINOPRIL 5 MG PO TABS
5.0000 mg | ORAL_TABLET | Freq: Every day | ORAL | Status: DC
  Administered 2011-05-05: 5 mg via ORAL
  Filled 2011-05-05: qty 1

## 2011-05-05 MED ORDER — LISINOPRIL 5 MG PO TABS: 5.0000 mg | ORAL_TABLET | Freq: Every day | ORAL | Status: DC

## 2011-05-05 NOTE — Progress Notes (Signed)
@   Subjective:  Chest pain with cough; no dyspnea   Objective:  Filed Vitals:   05/04/11 1300 05/04/11 1403 05/04/11 1951 05/05/11 0323  BP: 117/87 133/90 119/83 115/74  Pulse: 83 66 76 70  Temp:  97.9 F (36.6 C) 97 F (36.1 C) 97.1 F (36.2 C)  TempSrc:  Oral    Resp: 17 18 18 18   Height:  6\' 1"  (1.854 m)    Weight:  135 lb 12.9 oz (61.6 kg)  139 lb 15.9 oz (63.5 kg)  SpO2: 99% 100% 100% 100%    Intake/Output from previous day:  Intake/Output Summary (Last 24 hours) at 05/05/11 0601 Last data filed at 05/05/11 1610  Gross per 24 hour  Intake   2630 ml  Output   1400 ml  Net   1230 ml    Physical Exam: Physical exam: Well-developed well-nourished in no acute distress.  Skin is warm and dry.  HEENT is normal.  Neck is supple. No thyromegaly.  Chest is diminished BS throughout Cardiovascular exam is regular rate and rhythm.  Abdominal exam nontender or distended. No masses palpated. Extremities show no edema. neuro grossly intact    Lab Results: Basic Metabolic Panel:  Basename 05/04/11 0432 05/03/11 0320  NA 137 141  K 3.1* 4.2  CL 103 109  CO2 25 21  GLUCOSE 126* 114*  BUN 15 20  CREATININE 0.96 0.93  CALCIUM 8.9 9.1  MG 2.1 --  PHOS 3.7 --   CBC:  Basename 05/04/11 0432 05/03/11 0320  WBC 5.6 9.5  NEUTROABS -- 4.1  HGB 14.0 14.8  HCT 42.8 45.0  MCV 85.1 86.2  PLT 209 217   Cardiac Enzymes:  Basename 05/04/11 0432  CKTOTAL 190  CKMB 3.1  CKMBINDEX --  TROPONINI <0.30     Assessment/Plan:  1) CHF - due to NICM, decreased lasix dose and cocaine use. Much improved; would continue lasix and lisinopril. 2) NICM - Increase lisinopril to 5 mg po daily; no beta blocker given recent cocaine use. 3) CAD - Continue ASA and statin; CM out of proportion. 4) substance abuse - patient counseled on discontinuing. Patient can be dced from cardiac standpoint if AM labs stable; he will need close fu with his primary care and repeat bmet as outpatient.  Substance abuse and noncompliance are his biggest issues. Not a candidate for ICD unless he can demonstrate avoidance of cocaine use and compliance with fu.   [name] 05/05/2011, 6:01 AM

## 2011-05-05 NOTE — Discharge Summary (Signed)
Admit date: 05/03/2011 Discharge date: 05/05/2011  Primary Care Physician:  No primary provider on file.   Discharge Diagnoses:     . Acute respiratory failure with hypoxia 05/03/2011   . Acute on chronic systolic heart failure 05/05/2011   . SUBSTANCE ABUSE, MULTIPLE 03/25/2010   . HYPERTENSION 03/25/2010   . CAD 03/25/2010              DISCHARGE MEDICATION: Current Discharge Medication List    START taking these medications   Details  aspirin 81 MG chewable tablet Chew 1 tablet (81 mg total) by mouth daily. Qty: 30 tablet, Refills: 0    furosemide (LASIX) 40 MG tablet Take 1 tablet (40 mg total) by mouth daily. Qty: 30 tablet, Refills: 0      CONTINUE these medications which have CHANGED   Details  lisinopril (PRINIVIL,ZESTRIL) 5 MG tablet Take 1 tablet (5 mg total) by mouth daily. Qty: 30 tablet, Refills: 0      CONTINUE these medications which have NOT CHANGED   Details  simvastatin (ZOCOR) 40 MG tablet Take 40 mg by mouth at bedtime.        STOP taking these medications     tadalafil (CIALIS) 5 MG tablet            Consults: Treatment Team:  [facility]   SIGNIFICANT DIAGNOSTIC STUDIES:  Dg Chest Port 1 View  05/04/2011  *RADIOLOGY REPORT*  Clinical Data: Weakness, shortness of breath  PORTABLE CHEST - 1 VIEW  Comparison: 05/03/2011; 03/08/2011  Findings:  Grossly unchanged enlarged cardiac silhouette and mediastinal contours.  Interval extubation.  The lungs remain hyperinflated with bilateral bullous emphysematous change.  No definite pneumothorax.  No definite pleural effusion.  Overall improved aeration of the lungs with increased conspicuity of the pulmonary vasculature.  Query mild persistent pulmonary edema.  Unchanged bones.  IMPRESSION: 1.  Interval extubation.  No pneumothorax.  2.  Overall improved aeration of the lungs with findings suggestive of mild persistent pulmonary edema.  Original Report Authenticated By: Waynard Reeds,  M.D.   Portable Chest Xray In Am  05/03/2011  *RADIOLOGY REPORT*  Clinical Data: Evaluate infiltrates  PORTABLE CHEST - 1 VIEW  Comparison: Chest radiograph 05/03/2011  Findings: Endotracheal tube, endotracheal tube and NG tube are unchanged.  Stable cardiac silhouette.  There is improvement in the perihilar air space disease seen on prior.  There is bullous change at the left lung apex.  IMPRESSION:  1.  Stable support apparatus. 2.  Improvement pulmonary edema pattern.  Original Report Authenticated By: Genevive Bi, M.D.   Dg Chest Port 1 View  05/03/2011  *RADIOLOGY REPORT*  Clinical Data: Endotracheal tube placement and orogastric tube placement.  Crack cocaine overdose, with shortness of breath.  PORTABLE CHEST - 1 VIEW  Comparison: Chest radiograph performed 03/08/2011  Findings: The patient's endotracheal tube is seen ending 7 cm above the carina.  This could be advanced 3-4 cm, as deemed clinically appropriate.  An enteric tube is noted extending to the inferiormost aspect of the image, at the level of the diaphragm.  Lucency at the right lung apex is thought to reflect a focal bleb. Bilateral central airspace opacities are compatible with pulmonary edema.  No definite pleural effusion is identified, though the costophrenic angles are incompletely imaged on this study.  No pneumothorax is seen.  The cardiomediastinal silhouette is borderline normal in size.  No acute osseous abnormalities are identified.  IMPRESSION:  1.  Endotracheal tube seen ending 7  cm above the carina; this could be advanced 3-4 cm, as deemed clinically appropriate. 2.  Bilateral central airspace opacities, compatible with pulmonary edema.  Original Report Authenticated By: Tonia Ghent, M.D.      Recent Results (from the past 240 hour(s))  URINE CULTURE     Status: Normal   Collection Time   05/03/11  4:11 AM      Component Value Range Status Comment   Specimen Description URINE, CATHETERIZED   Final    Special  Requests NONE   Final    Setup Time [id number]   Final    Colony Count NO GROWTH   Final    Culture NO GROWTH   Final    Report Status 05/04/2011 FINAL   Final   MRSA PCR SCREENING     Status: Normal   Collection Time   05/03/11  6:16 AM      Component Value Range Status Comment   MRSA by PCR NEGATIVE  NEGATIVE  Final     BRIEF ADMITTING H & P: Brief history  Mr. Shein is a 60 year-old gentleman who presented to the ED in respiratory distress. History of polysubstance abuse, including cocaine  1-Acute respiratory failure, secondary to flash pulmonary edema. She required intubation on  December 23 and  he was extubated December 24. Respiratory failure was thought to be 2/pulmonary edema secondary to acute systole heart failure exacerbation in the setting of cocaine use. Patient was in easily admitted by CCM then transferred care to [facility]. Respiratory failure has resolved. He tolerated well extubation. We continue with Lasix 40 mg initially IV and subsequently oral. Cardiology was consulted, patient is not candidate for an ICD unless he can demonstrate avoidance of  cocaine use and compliance with medical treatment.  They were recommended continue with Lasix and ACE no further  Cardiac  evaluation at this time. Acute systolic Hear failure exacerbation: Secondary to medications no compliance and cocaine. see problem #1.    Disposition and Follow-up:  Discharge Orders    Future Orders Please Complete By Expires   Diet - low sodium heart healthy      Increase activity slowly           DISCHARGE EXAM:   Physical Exam  Constitutional: He appears well-developed.  HENT:  Head: Normocephalic and atraumatic.  Nose: Nose normal.  Mouth/Throat: Oropharynx is clear and moist.  Eyes: Conjunctivae are normal. Pupils are equal, round, and reactive to light.  Neck: No JVD present. No thyromegaly present.  Cardiovascular: Normal rate, regular rhythm and normal heart sounds.    Pulmonary/Chest: Breath sounds normal. No stridor.  Abdominal: Soft. Bowel sounds are normal. He exhibits no distension. There is no tenderness. There is no rebound.  Musculoskeletal: Normal range of motion.  Lymphadenopathy:  He has no cervical adenopathy.  Skin: Skin is warm and dry. No rash noted.    Blood pressure 115/74, pulse 70, temperature 97.1 F (36.2 C), temperature source Oral, resp. rate 18, height 6\' 1"  (1.854 m), weight 63.5 kg (139 lb 15.9 oz), SpO2 100.00%.   Basename 05/05/11 0530 05/04/11 0432  NA 138 137  K 3.6 3.1*  CL 104 103  CO2 24 25  GLUCOSE 117* 126*  BUN 15 15  CREATININE 1.03 0.96  CALCIUM 9.1 8.9  MG -- 2.1  PHOS -- 3.7    Basename 05/04/11 0432 05/03/11 0320  WBC 5.6 9.5  NEUTROABS -- 4.1  HGB 14.0 14.8  HCT 42.8 45.0  MCV 85.1 86.2  PLT 209 217    Signed: REGALADO,BELKYS M.D. 05/05/2011, 9:23 AM

## 2011-05-08 NOTE — Progress Notes (Signed)
Utilization Review Completed.[name] T12/28/2012   

## 2011-07-06 ENCOUNTER — Emergency Department (HOSPITAL_COMMUNITY): Payer: Self-pay

## 2011-07-06 ENCOUNTER — Encounter (HOSPITAL_COMMUNITY): Payer: Self-pay

## 2011-07-06 ENCOUNTER — Other Ambulatory Visit: Payer: Self-pay

## 2011-07-06 ENCOUNTER — Inpatient Hospital Stay (HOSPITAL_COMMUNITY)
Admission: EM | Admit: 2011-07-06 | Discharge: 2011-07-09 | DRG: 189 | Disposition: A | Discharge status: Home / Self Care | Payer: MEDICAID | Attending: Internal Medicine | Admitting: Internal Medicine

## 2011-07-06 DIAGNOSIS — I2510 Atherosclerotic heart disease of native coronary artery without angina pectoris: Secondary | ICD-10-CM | POA: Diagnosis present

## 2011-07-06 DIAGNOSIS — Z9119 Patient's noncompliance with other medical treatment and regimen: Secondary | ICD-10-CM

## 2011-07-06 DIAGNOSIS — Z96659 Presence of unspecified artificial knee joint: Secondary | ICD-10-CM

## 2011-07-06 DIAGNOSIS — R0789 Other chest pain: Secondary | ICD-10-CM | POA: Diagnosis present

## 2011-07-06 DIAGNOSIS — I5022 Chronic systolic (congestive) heart failure: Secondary | ICD-10-CM | POA: Diagnosis present

## 2011-07-06 DIAGNOSIS — I5023 Acute on chronic systolic (congestive) heart failure: Secondary | ICD-10-CM | POA: Diagnosis present

## 2011-07-06 DIAGNOSIS — J9600 Acute respiratory failure, unspecified whether with hypoxia or hypercapnia: Principal | ICD-10-CM | POA: Diagnosis present

## 2011-07-06 DIAGNOSIS — F1910 Other psychoactive substance abuse, uncomplicated: Secondary | ICD-10-CM | POA: Diagnosis present

## 2011-07-06 DIAGNOSIS — F1410 Cocaine abuse, uncomplicated: Secondary | ICD-10-CM | POA: Diagnosis present

## 2011-07-06 DIAGNOSIS — R9431 Abnormal electrocardiogram [ECG] [EKG]: Secondary | ICD-10-CM | POA: Diagnosis present

## 2011-07-06 DIAGNOSIS — J810 Acute pulmonary edema: Secondary | ICD-10-CM | POA: Diagnosis present

## 2011-07-06 DIAGNOSIS — I10 Essential (primary) hypertension: Secondary | ICD-10-CM | POA: Diagnosis present

## 2011-07-06 DIAGNOSIS — J4489 Other specified chronic obstructive pulmonary disease: Secondary | ICD-10-CM | POA: Diagnosis present

## 2011-07-06 DIAGNOSIS — I428 Other cardiomyopathies: Secondary | ICD-10-CM | POA: Diagnosis present

## 2011-07-06 DIAGNOSIS — E876 Hypokalemia: Secondary | ICD-10-CM | POA: Diagnosis present

## 2011-07-06 DIAGNOSIS — Z79899 Other long term (current) drug therapy: Secondary | ICD-10-CM

## 2011-07-06 DIAGNOSIS — J449 Chronic obstructive pulmonary disease, unspecified: Secondary | ICD-10-CM | POA: Diagnosis present

## 2011-07-06 DIAGNOSIS — Z7982 Long term (current) use of aspirin: Secondary | ICD-10-CM

## 2011-07-06 DIAGNOSIS — R079 Chest pain, unspecified: Secondary | ICD-10-CM | POA: Diagnosis present

## 2011-07-06 DIAGNOSIS — F17200 Nicotine dependence, unspecified, uncomplicated: Secondary | ICD-10-CM | POA: Diagnosis present

## 2011-07-06 DIAGNOSIS — J9601 Acute respiratory failure with hypoxia: Secondary | ICD-10-CM

## 2011-07-06 DIAGNOSIS — I509 Heart failure, unspecified: Secondary | ICD-10-CM | POA: Diagnosis present

## 2011-07-06 DIAGNOSIS — R0600 Dyspnea, unspecified: Secondary | ICD-10-CM

## 2011-07-06 DIAGNOSIS — Z91199 Patient's noncompliance with other medical treatment and regimen due to unspecified reason: Secondary | ICD-10-CM

## 2011-07-06 DIAGNOSIS — J811 Chronic pulmonary edema: Secondary | ICD-10-CM

## 2011-07-06 HISTORY — DX: Atherosclerotic heart disease of native coronary artery without angina pectoris: I25.10

## 2011-07-06 HISTORY — DX: Other psychoactive substance abuse, uncomplicated: F19.10

## 2011-07-06 HISTORY — DX: Chronic systolic (congestive) heart failure: I50.22

## 2011-07-06 LAB — CREATININE, SERUM
GFR calc Af Amer: >90 mL/min (ref >90)
GFR calc non Af Amer: >90 mL/min (ref >90)

## 2011-07-06 LAB — URINALYSIS, ROUTINE W REFLEX MICROSCOPIC
Glucose, UA: NEGATIVE mg/dL
Hgb urine dipstick: NEGATIVE
Ketones, ur: NEGATIVE mg/dL
Leukocytes, UA: NEGATIVE
Protein, ur: 30 mg/dL — AB
pH: 6 (ref 5.0–8.0)

## 2011-07-06 LAB — COMPREHENSIVE METABOLIC PANEL
Albumin: 3.1 g/dL — ABNORMAL LOW (ref 3.5–5.2)
BUN: 14 mg/dL (ref 6–23)
CO2: 26 mEq/L (ref 19–32)
Calcium: 9.5 mg/dL (ref 8.4–10.5)
Chloride: 104 mEq/L (ref 96–112)
Chloride: 105 mEq/L (ref 96–112)
Creatinine, Ser: 1.12 mg/dL (ref 0.50–1.35)
Creatinine, Ser: 1.04 mg/dL (ref 0.50–1.35)
GFR calc Af Amer: 82 mL/min — ABNORMAL LOW (ref >90)
GFR calc Af Amer: 90 mL/min — ABNORMAL LOW (ref >90)
GFR calc non Af Amer: 71 mL/min — ABNORMAL LOW (ref >90)
Glucose, Bld: 144 mg/dL — ABNORMAL HIGH (ref 70–99)
Glucose, Bld: 136 mg/dL — ABNORMAL HIGH (ref 70–99)
Total Bilirubin: 0.2 mg/dL — ABNORMAL LOW (ref 0.3–1.2)
Total Bilirubin: 0.4 mg/dL (ref 0.3–1.2)
Total Protein: 6.5 g/dL (ref 6.0–8.3)

## 2011-07-06 LAB — CBC
HCT: 43.7 % (ref 39.0–52.0)
HCT: 42.3 % (ref 39.0–52.0)
HCT: 41 % (ref 39.0–52.0)
Hemoglobin: 14.6 g/dL (ref 13.0–17.0)
Hemoglobin: 13.5 g/dL (ref 13.0–17.0)
MCV: 84.5 fL (ref 78.0–100.0)
MCV: 84.7 fL (ref 78.0–100.0)
Platelets: 225 10*3/uL (ref 150–400)
RBC: 5.17 MIL/uL (ref 4.22–5.81)
RBC: 4.84 MIL/uL (ref 4.22–5.81)
RDW: 14.7 % (ref 11.5–15.5)
RDW: 14.7 % (ref 11.5–15.5)
WBC: 9.3 10*3/uL (ref 4.0–10.5)
WBC: 7.2 10*3/uL (ref 4.0–10.5)
WBC: 6.2 10*3/uL (ref 4.0–10.5)

## 2011-07-06 LAB — CARDIAC PANEL(CRET KIN+CKTOT+MB+TROPI)
CK, MB: 3.4 ng/mL (ref 0.3–4.0)
CK, MB: 4.3 ng/mL — ABNORMAL HIGH (ref 0.3–4.0)
Relative Index: 2.3 (ref 0.0–2.5)
Total CK: 153 U/L (ref 7–232)
Total CK: 183 U/L (ref 7–232)
Total CK: 163 U/L (ref 7–232)

## 2011-07-06 LAB — RAPID URINE DRUG SCREEN, HOSP PERFORMED
Amphetamines: NOT DETECTED
Benzodiazepines: NOT DETECTED
Opiates: POSITIVE — AB
Tetrahydrocannabinol: NOT DETECTED

## 2011-07-06 LAB — URINE MICROSCOPIC-ADD ON

## 2011-07-06 LAB — POCT I-STAT 3, ART BLOOD GAS (G3+)
Acid-Base Excess: 1 mmol/L (ref 0.0–2.0)
Bicarbonate: 27.9 mEq/L — ABNORMAL HIGH (ref 20.0–24.0)
Bicarbonate: 28.6 mEq/L — ABNORMAL HIGH (ref 20.0–24.0)
TCO2: 30 mmol/L (ref 0–100)
pCO2 arterial: 51.3 mmHg — ABNORMAL HIGH (ref 35.0–45.0)
pCO2 arterial: 50.6 mmHg — ABNORMAL HIGH (ref 35.0–45.0)
pH, Arterial: 7.36 (ref 7.350–7.450)
pO2, Arterial: 65 mmHg — ABNORMAL LOW (ref 80.0–100.0)
pO2, Arterial: 126 mmHg — ABNORMAL HIGH (ref 80.0–100.0)

## 2011-07-06 LAB — HIV ANTIBODY (ROUTINE TESTING W REFLEX): HIV: NONREACTIVE

## 2011-07-06 LAB — TSH: TSH: 0.816 u[IU]/mL (ref 0.350–4.500)

## 2011-07-06 LAB — DIFFERENTIAL
Eosinophils Relative: 2 % (ref 0–5)
Lymphocytes Relative: 40 % (ref 12–46)
Lymphs Abs: 3.7 10*3/uL (ref 0.7–4.0)
Monocytes Absolute: 0.8 10*3/uL (ref 0.1–1.0)
Monocytes Relative: 9 % (ref 3–12)

## 2011-07-06 MED ORDER — ALBUTEROL SULFATE (5 MG/ML) 0.5% IN NEBU: 5.0000 mg | INHALATION_SOLUTION | Freq: Once | RESPIRATORY_TRACT | Status: DC

## 2011-07-06 MED ORDER — ACETAMINOPHEN 325 MG PO TABS
650.0000 mg | ORAL_TABLET | Freq: Four times a day (QID) | ORAL | Status: DC | PRN
  Administered 2011-07-08: 650 mg via ORAL
  Filled 2011-07-06 (×3): qty 2

## 2011-07-06 MED ORDER — ENOXAPARIN SODIUM 40 MG/0.4ML ~~LOC~~ SOLN
40.0000 mg | SUBCUTANEOUS | Status: DC
  Administered 2011-07-06 – 2011-07-09 (×4): 40 mg via SUBCUTANEOUS
  Filled 2011-07-06 (×4): qty 0.4

## 2011-07-06 MED ORDER — ACETAMINOPHEN 650 MG RE SUPP: 650.0000 mg | Freq: Four times a day (QID) | RECTAL | Status: DC | PRN

## 2011-07-06 MED ORDER — SODIUM CHLORIDE 0.9 % IJ SOLN
3.0000 mL | Freq: Two times a day (BID) | INTRAMUSCULAR | Status: DC
  Administered 2011-07-06 (×2): 3 mL via INTRAVENOUS

## 2011-07-06 MED ORDER — METHYLPREDNISOLONE SODIUM SUCC 125 MG IJ SOLR
125.0000 mg | Freq: Once | INTRAMUSCULAR | Status: AC
  Administered 2011-07-06: 125 mg via INTRAVENOUS
  Filled 2011-07-06: qty 2

## 2011-07-06 MED ORDER — ASPIRIN EC 325 MG PO TBEC
325.0000 mg | DELAYED_RELEASE_TABLET | Freq: Every day | ORAL | Status: DC
  Administered 2011-07-06 – 2011-07-08 (×3): 325 mg via ORAL
  Filled 2011-07-06 (×3): qty 1

## 2011-07-06 MED ORDER — SODIUM CHLORIDE 0.9 % IJ SOLN
3.0000 mL | Freq: Two times a day (BID) | INTRAMUSCULAR | Status: DC
  Administered 2011-07-06 – 2011-07-09 (×8): 3 mL via INTRAVENOUS

## 2011-07-06 MED ORDER — ONDANSETRON HCL 4 MG/2ML IJ SOLN: 4.0000 mg | Freq: Four times a day (QID) | INTRAMUSCULAR | Status: DC | PRN

## 2011-07-06 MED ORDER — LORAZEPAM 2 MG/ML IJ SOLN
1.0000 mg | Freq: Four times a day (QID) | INTRAMUSCULAR | Status: DC | PRN
  Administered 2011-07-08: 1 mg via INTRAVENOUS
  Filled 2011-07-06: qty 1

## 2011-07-06 MED ORDER — FUROSEMIDE 10 MG/ML IJ SOLN
40.0000 mg | Freq: Every day | INTRAMUSCULAR | Status: DC
  Administered 2011-07-06: 02:00:00 via INTRAVENOUS
  Administered 2011-07-06 – 2011-07-07 (×3): 40 mg via INTRAVENOUS
  Filled 2011-07-06 (×3): qty 4

## 2011-07-06 MED ORDER — MORPHINE SULFATE 4 MG/ML IJ SOLN
INTRAMUSCULAR | Status: AC
  Filled 2011-07-06: qty 1

## 2011-07-06 MED ORDER — ALBUTEROL SULFATE (5 MG/ML) 0.5% IN NEBU
2.5000 mg | INHALATION_SOLUTION | RESPIRATORY_TRACT | Status: DC | PRN
  Administered 2011-07-07: 2.5 mg via RESPIRATORY_TRACT
  Filled 2011-07-06 (×4): qty 0.5

## 2011-07-06 MED ORDER — NITROGLYCERIN 0.4 MG/HR TD PT24
0.4000 mg | MEDICATED_PATCH | Freq: Every day | TRANSDERMAL | Status: DC
  Administered 2011-07-06 – 2011-07-09 (×3): 0.4 mg via TRANSDERMAL
  Filled 2011-07-06 (×4): qty 1

## 2011-07-06 MED ORDER — FUROSEMIDE 10 MG/ML IJ SOLN
INTRAMUSCULAR | Status: AC
  Filled 2011-07-06: qty 4

## 2011-07-06 MED ORDER — ONDANSETRON HCL 4 MG PO TABS: 4.0000 mg | ORAL_TABLET | Freq: Four times a day (QID) | ORAL | Status: DC | PRN

## 2011-07-06 MED ORDER — LISINOPRIL 5 MG PO TABS
5.0000 mg | ORAL_TABLET | Freq: Every day | ORAL | Status: DC
  Administered 2011-07-06 – 2011-07-08 (×3): 5 mg via ORAL
  Filled 2011-07-06 (×3): qty 1

## 2011-07-06 MED ORDER — SIMVASTATIN 40 MG PO TABS
40.0000 mg | ORAL_TABLET | Freq: Every day | ORAL | Status: DC
  Administered 2011-07-06 – 2011-07-08 (×3): 40 mg via ORAL
  Filled 2011-07-06 (×4): qty 1

## 2011-07-06 NOTE — Progress Notes (Signed)
[facility]  Subjective: Admitted to the hospital overnight. Initially required BiPAP therapy due to hypoxemia but has now been weaned to nasal cannula oxygen. Patient currently denies shortness of breath or chest pain. States at home had abrupt onset of respiratory symptoms. Had significant orthopnea and dyspnea on exertion. Also had acute left-sided anterior chest pain that was sharp and described as like a knife sticking in his chest. Relates to [place] has been compliant with medications but also endorsed in the past 2-3 days he did utilize a "small amount" of intranasal cocaine. States is compliant with M.D. followup appointments.  Objective: Blood pressure 118/77, pulse 70, temperature 98.7 F (37.1 C), temperature source Oral, resp. rate 24, height 6\' 1"  (1.854 m), weight 61.7 kg (136 lb 0.4 oz), SpO2 95.00%.   F/u exam completed  Lab Results:  [facility] 07/06/11 0756 07/06/11 0226  WBC 7.2 9.3  HGB 14.1 14.6  HCT 42.3 43.7  PLT 225 266   BMET  Basename 07/06/11 0756 07/06/11 0226  NA 141 140  K 3.7 3.5  CL 105 104  CO2 27 26  GLUCOSE 136* 144*  BUN 14 14  CREATININE 1.04 1.12  CALCIUM 9.4 9.5   Medications:  I have reviewed the patient's current medications.  Assessment/Plan:  *Acute respiratory failure with hypoxia *Primary etiology felt to be secondary to acute systolic heart failure exac and flash pulmonary edema, likely due to malignant HTN brought about by cocaine use *Has been successfully weaned off of BiPAP and maintaining appropriate saturations on nasal cannula oxygen *Please note did require intubation during December 2012 admission for similar problem, and was also using cocaine at that time  acute exac of Chronic systolic congestive heart failure, NYHA class 3/Flash pulmonary edema *See above *Etiology is nonischemic cardiomyopathy likely due to issues with uncontrolled hypertension as well as ongoing use of cocaine *January 2012 cardiac  catheterization noted -  the cardiologist recommended medical therapy *Continue home medications of lisinopril as well as Lasix which currently is being administered IV route   HYPERTENSION *Blood pressure was 148/118 at presentation *Currently blood pressure well controlled   Non-occlusive coronary artery disease *Had cardiac catheterization in January 2012 that showed diffuse nonobstructive coronary disease involving a 50-70% diffuse lesion at the distal RCA as well as a 50% lesion in the PDA and a diffuse 50% lesion in the LAD *Because of ongoing use of cocaine cannot use beta blocker therapy unopposed - if needed could initiate carvedilol   Abnormal EKG/Chest pain *Has deep Q waves in the inferior leads which are similar to previous EKGs *We will cycle his cardiac isoenzymes since he did present with chest pain but at this point see no indication to notify cardiology for consultation unless the enzymes become positive since had recent cardiac catheterization in January 2012 *Likely chest pain related to uncontrolled hypertension and respiratory issues *Patient has a history of smoking cocaine and 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 Has been informed this is a life-threatening issue and continued use of cocaine could induce severe heart failure and respiratory distress that may not respond to conventional therapies or could induce a myocardial infarction that may be life-threatening   Disposition *Transfer to telemetry floor   LOS: 0 days   Junious Silk, ANP pager [phone]  [facility] [email or web address] Password: [email or web address]  07/06/2011, 2:38 PM  I  have personally examined this patient and reviewed the entire database. I have reviewed the above note, made any necessary editorial changes, and agree  with its content.  Lonia Blood, MD [facility]

## 2011-07-06 NOTE — ED Notes (Signed)
MD at bedside. 

## 2011-07-06 NOTE — H&P (Signed)
[name] is an 61 y.o. male.   PCP - Dr.Edwin Avbuerre. Chief Complaint: Shortness of breath and chest pain. HPI: 61 year old male with no history of past's abuse including cocaine history of nonischemic cardiomyopathy last EF measured in December 2012 was 20-25% history of nonobstructive coronary disease as per cardiac catheter in January 2012 was brought in the ER the patient developed sudden onset of severe shortness of breath last midnight with chest pain which was retrosternal. EMS en route gave Lasix IV and when patient reached ER he was short of breath was placed on BiPAP and given Solu-Medrol and albuterol nebulizers. Patient's cardiac enzymes were negative EKG showed normal sinus rhythm with early repolarization changes. Chest x-ray showing features of pulmonary edema. Patient's condition improved gradually with diuresis and BiPAP. Presently patient is not in distress but continued to be on BiPAP. Patient is chest pain-free. Patient admits to taking cocaine 2 days ago. Denies any fever chills cough or phlegm denies any nausea vomiting abdominal pain dysuria or discharge is a focal deficits.  Past Medical History  Diagnosis Date  . Hypertension   . CHF (congestive heart failure)     Past Surgical History  Procedure Date  . Joint replacement L knee    Family History  Problem Relation Age of Onset  . Malignant hyperthermia Mother    Social History:  reports that he has been smoking.  He has never used smokeless tobacco. He reports that he uses illicit drugs (Cocaine) about once per week. He reports that he does not drink alcohol.  Allergies: No Known Allergies  Medications Prior to Admission  Medication Dose Route Frequency Provider Last Rate Last Dose  . albuterol (PROVENTIL) (5 MG/ML) 0.5% nebulizer solution 5 mg  5 mg Nebulization Once Loren Racer, MD      . furosemide (LASIX) 10 MG/ML injection           . methylPREDNISolone sodium succinate (SOLU-MEDROL) 125 MG  injection 125 mg  125 mg Intravenous Once Loren Racer, MD   125 mg at 07/06/11 0227  . DISCONTD: morphine 4 MG/ML injection            Medications Prior to Admission  Medication Sig Dispense Refill  . aspirin 81 MG chewable tablet Chew 1 tablet (81 mg total) by mouth daily.  30 tablet  0  . furosemide (LASIX) 40 MG tablet Take 1 tablet (40 mg total) by mouth daily.  30 tablet  0  . lisinopril (PRINIVIL,ZESTRIL) 5 MG tablet Take 1 tablet (5 mg total) by mouth daily.  30 tablet  0  . simvastatin (ZOCOR) 40 MG tablet Take 40 mg by mouth at bedtime.          Results for orders placed during the hospital encounter of 07/06/11 (from the past 48 hour(s))  URINALYSIS, ROUTINE W REFLEX MICROSCOPIC     Status: Abnormal   Collection Time   07/06/11  2:21 AM      Component Value Range Comment   Color, Urine YELLOW  YELLOW     APPearance CLEAR  CLEAR     Specific Gravity, Urine 1.012  1.005 - 1.030     pH 6.0  5.0 - 8.0     Glucose, UA NEGATIVE  NEGATIVE (mg/dL)    Hgb urine dipstick NEGATIVE  NEGATIVE     Bilirubin Urine NEGATIVE  NEGATIVE     Ketones, ur NEGATIVE  NEGATIVE (mg/dL)    Protein, ur 30 (*) NEGATIVE (mg/dL)    Urobilinogen,  UA 1.0  0.0 - 1.0 (mg/dL)    Nitrite NEGATIVE  NEGATIVE     Leukocytes, UA NEGATIVE  NEGATIVE    URINE RAPID DRUG SCREEN (HOSP PERFORMED)     Status: Abnormal   Collection Time   07/06/11  2:21 AM      Component Value Range Comment   Opiates POSITIVE (*) NONE DETECTED     Cocaine POSITIVE (*) NONE DETECTED     Benzodiazepines NONE DETECTED  NONE DETECTED     Amphetamines NONE DETECTED  NONE DETECTED     Tetrahydrocannabinol NONE DETECTED  NONE DETECTED     Barbiturates NONE DETECTED  NONE DETECTED    URINE MICROSCOPIC-ADD ON     Status: Abnormal   Collection Time   07/06/11  2:21 AM      Component Value Range Comment   Squamous Epithelial / LPF RARE  RARE     WBC, UA 0-2  <3 (WBC/hpf)    RBC / HPF 0-2  <3 (RBC/hpf)    Bacteria, UA RARE  RARE      Casts HYALINE CASTS (*) NEGATIVE    CBC     Status: Normal   Collection Time   07/06/11  2:26 AM      Component Value Range Comment   WBC 9.3  4.0 - 10.5 (K/uL)    RBC 5.17  4.22 - 5.81 (MIL/uL)    Hemoglobin 14.6  13.0 - 17.0 (g/dL)    HCT 16.1  09.6 - 04.5 (%)    MCV 84.5  78.0 - 100.0 (fL)    MCH 28.2  26.0 - 34.0 (pg)    MCHC 33.4  30.0 - 36.0 (g/dL)    RDW 40.9  81.1 - 91.4 (%)    Platelets 266  150 - 400 (K/uL)   DIFFERENTIAL     Status: Normal   Collection Time   07/06/11  2:26 AM      Component Value Range Comment   Neutrophils Relative 50  43 - 77 (%)    Neutro Abs 4.6  1.7 - 7.7 (K/uL)    Lymphocytes Relative 40  12 - 46 (%)    Lymphs Abs 3.7  0.7 - 4.0 (K/uL)    Monocytes Relative 9  3 - 12 (%)    Monocytes Absolute 0.8  0.1 - 1.0 (K/uL)    Eosinophils Relative 2  0 - 5 (%)    Eosinophils Absolute 0.2  0.0 - 0.7 (K/uL)    Basophils Relative 0  0 - 1 (%)    Basophils Absolute 0.0  0.0 - 0.1 (K/uL)   COMPREHENSIVE METABOLIC PANEL     Status: Abnormal   Collection Time   07/06/11  2:26 AM      Component Value Range Comment   Sodium 140  135 - 145 (mEq/L)    Potassium 3.5  3.5 - 5.1 (mEq/L)    Chloride 104  96 - 112 (mEq/L)    CO2 26  19 - 32 (mEq/L)    Glucose, Bld 144 (*) 70 - 99 (mg/dL)    BUN 14  6 - 23 (mg/dL)    Creatinine, Ser 7.82  0.50 - 1.35 (mg/dL)    Calcium 9.5  8.4 - 10.5 (mg/dL)    Total Protein 6.8  6.0 - 8.3 (g/dL)    Albumin 3.3 (*) 3.5 - 5.2 (g/dL)    AST 42 (*) 0 - 37 (U/L)    ALT 32  0 - 53 (U/L)  Alkaline Phosphatase 134 (*) 39 - 117 (U/L)    Total Bilirubin 0.2 (*) 0.3 - 1.2 (mg/dL)    GFR calc non Af Amer 71 (*) >90 (mL/min)    GFR calc Af Amer 82 (*) >90 (mL/min)   CARDIAC PANEL(CRET KIN+CKTOT+MB+TROPI)     Status: Normal   Collection Time   07/06/11  2:26 AM      Component Value Range Comment   Total CK 153  7 - 232 (U/L)    CK, MB 3.4  0.3 - 4.0 (ng/mL)    Troponin I <0.30  <0.30 (ng/mL)    Relative Index 2.2  0.0 - 2.5      ETHANOL     Status: Normal   Collection Time   07/06/11  2:26 AM      Component Value Range Comment   Alcohol, Ethyl (B) <11  0 - 11 (mg/dL)   PRO B NATRIURETIC PEPTIDE     Status: Abnormal   Collection Time   07/06/11  2:27 AM      Component Value Range Comment   Pro B Natriuretic peptide (BNP) 2113.0 (*) 0 - 125 (pg/mL)   POCT I-STAT 3, BLOOD GAS (G3+)     Status: Abnormal   Collection Time   07/06/11  2:52 AM      Component Value Range Comment   pH, Arterial 7.343 (*) 7.350 - 7.450     pCO2 arterial 51.3 (*) 35.0 - 45.0 (mmHg)    pO2, Arterial 65.0 (*) 80.0 - 100.0 (mmHg)    Bicarbonate 27.9 (*) 20.0 - 24.0 (mEq/L)    TCO2 29  0 - 100 (mmol/L)    O2 Saturation 91.0      Acid-Base Excess 1.0  0.0 - 2.0 (mmol/L)    Collection site RADIAL, ALLEN'S TEST ACCEPTABLE      Drawn by Operator      Sample type ARTERIAL     POCT I-STAT 3, BLOOD GAS (G3+)     Status: Abnormal   Collection Time   07/06/11  4:10 AM      Component Value Range Comment   pH, Arterial 7.360  7.350 - 7.450     pCO2 arterial 50.6 (*) 35.0 - 45.0 (mmHg)    pO2, Arterial 126.0 (*) 80.0 - 100.0 (mmHg)    Bicarbonate 28.6 (*) 20.0 - 24.0 (mEq/L)    TCO2 30  0 - 100 (mmol/L)    O2 Saturation 99.0      Acid-Base Excess 2.0  0.0 - 2.0 (mmol/L)    Collection site RADIAL, ALLEN'S TEST ACCEPTABLE      Drawn by Operator      Sample type ARTERIAL      Dg Chest Portable 1 View  07/06/2011  *RADIOLOGY REPORT*  Clinical Data: Respiratory distress.  History of COPD.  PORTABLE CHEST - 1 VIEW  Comparison: 05/04/2011  Findings: Single view chest demonstrates patchy bilateral airspace disease, particularly in the right lower lung.  Heart size is stable and upper limits of normal.  Trachea is midline.  No evidence for pneumothorax.  Stable lucency in the medial right lung apex probably represents a bleb or bulla.  IMPRESSION: Bilateral airspace disease is suggestive for pulmonary edema versus multifocal pneumonia.  Original Report  Authenticated By: Richarda Overlie, M.D.    Review of Systems  Constitutional: Negative.   HENT: Negative.   Eyes: Negative.   Respiratory: Positive for shortness of breath.   Cardiovascular: Positive for chest pain.  Gastrointestinal: Negative.  Genitourinary: Negative.   Musculoskeletal: Negative.   Skin: Negative.   Neurological: Negative.   Endo/Heme/Allergies: Negative.   Psychiatric/Behavioral: Negative.     Blood pressure 130/94, pulse 79, temperature 97.5 F (36.4 C), temperature source Oral, resp. rate 12, SpO2 99.00%. Physical Exam  Constitutional: He is oriented to person, place, and time. He appears well-developed and well-nourished. No distress.  HENT:  Head: Normocephalic and atraumatic.  Right Ear: External ear normal.  Left Ear: External ear normal.  Nose: Nose normal.  Mouth/Throat: Oropharynx is clear and moist. No oropharyngeal exudate.  Eyes:       Bilateral dilated pupils.  Neck: Normal range of motion. Neck supple.  Cardiovascular: Normal rate and regular rhythm.   Respiratory: Effort normal and breath sounds normal. No respiratory distress. He has no wheezes. He has no rales.  GI: Soft. Bowel sounds are normal. He exhibits no distension. There is no tenderness. There is no rebound.  Musculoskeletal: Normal range of motion. He exhibits no edema and no tenderness.  Neurological: He is alert and oriented to person, place, and time.       Moves upper and lower extremities 5/5.  Skin: Skin is warm and dry. No rash noted. He is not diaphoretic. No erythema.  Psychiatric: His behavior is normal.     Assessment/Plan #1. Acute hypoxic respiratory failure secondary to flash pulmonary edema due to cocaine abuse - this time we will continue BiPAP. Continue Lasix as 40 IV daily follow closely intake output and cycle cardiac markers and place patient on nitroglycerin paste and Ativan when necessary. #2. History of systolic heart failure with last EF measured in December  2012 was 20-25%. #3. History of nonobstructive coronary disease per cardiac catheter in January 2012. #4. History of hypertension and hyperlipidemia. #5. History of polysubstance abuse. #6. Possible COPD.  CODE STATUS - full code.  Shanitha Twining N. 07/06/2011, 5:51 AM

## 2011-07-06 NOTE — ED Notes (Signed)
Bed control notified of change to telemetry

## 2011-07-06 NOTE — ED Notes (Signed)
Pt alert and talking. Pt awaiting meal. Currently tolerating nasal cannula at 4L.

## 2011-07-06 NOTE — ED Provider Notes (Signed)
History     CSN: [id number]  Arrival date & time 07/06/11  0156   None     Chief Complaint  Patient presents with  . Respiratory Distress    (Consider location/radiation/quality/duration/timing/severity/associated sxs/prior treatment) HPI Pt called EMS for worsening SOB tonight. History limited due to bipap being in place. Pt has been seen before for flash pulmonary edema in association with with cocaine use. Pt denied such use to EMS. Given lasix and NTG by ems.  Past Medical History  Diagnosis Date  . Hypertension   . CHF (congestive heart failure)     Past Surgical History  Procedure Date  . Joint replacement L knee    Family History  Problem Relation Age of Onset  . Malignant hyperthermia Mother     History  Substance Use Topics  . Smoking status: Current Everyday Smoker -- 0.5 packs/day for 30 years  . Smokeless tobacco: Never Used  . Alcohol Use: No      Review of Systems  Constitutional: Negative for fever and chills.  Respiratory: Positive for shortness of breath.   Cardiovascular: Negative for chest pain and leg swelling.  Gastrointestinal: Negative for nausea, vomiting and abdominal pain.    Allergies  Review of patient's allergies indicates no known allergies.  Home Medications   Current Outpatient Rx  Name Route Sig Dispense Refill  . ASPIRIN 81 MG PO CHEW Oral Chew 1 tablet (81 mg total) by mouth daily. 30 tablet 0  . FUROSEMIDE 40 MG PO TABS Oral Take 1 tablet (40 mg total) by mouth daily. 30 tablet 0  . LISINOPRIL 5 MG PO TABS Oral Take 1 tablet (5 mg total) by mouth daily. 30 tablet 0  . SIMVASTATIN 40 MG PO TABS Oral Take 40 mg by mouth at bedtime.        BP 148/118  Pulse 115  Temp(Src) 97.5 F (36.4 C) (Oral)  Resp 20  SpO2 100%  Physical Exam  Nursing note and vitals reviewed. Constitutional: He is oriented to person, place, and time. He appears well-developed and well-nourished. No distress (Pt appears drowsy).  HENT:    Head: Normocephalic and atraumatic.  Mouth/Throat: Oropharynx is clear and moist.  Eyes: EOM are normal. Pupils are equal, round, and reactive to light.  Neck: Normal range of motion. Neck supple.  Cardiovascular: Normal rate and regular rhythm.   Pulmonary/Chest: Effort normal. No respiratory distress. He has wheezes. He has no rales.       Prolonged exp phase and scattered wheezing. Scattered rhonchi. Accessory muscle use   Abdominal: Soft. Bowel sounds are normal. There is no tenderness. There is no rebound and no guarding.  Musculoskeletal: Normal range of motion. He exhibits no edema and no tenderness.  Neurological: He is alert and oriented to person, place, and time.       Moves all 4 ext  Skin: Skin is warm and dry. No rash noted. No erythema.    ED Course  Procedures (including critical care time)  Labs Reviewed - No data to display No results found.   No diagnosis found.   Date: 07/06/2011  Rate: 111  Rhythm: sinus tachycardia  QRS Axis: normal  Intervals: normal  ST/T Wave abnormalities: nonspecific T wave changes  Conduction Disutrbances:none  Narrative Interpretation:   Old EKG Reviewed: unchanged    MDM  Pt with significant improvement of Bipap. Discussed with [facility]. Will admit        Loren Racer, MD 07/06/11 [phone]

## 2011-07-06 NOTE — ED Notes (Signed)
EMS provided pt w/ morphine 4mg  and nitro SL x1

## 2011-07-06 NOTE — ED Notes (Signed)
Attempted to call report x 1  

## 2011-07-06 NOTE — ED Notes (Signed)
Pt states that he has been compliant with medications; when EMS arrived pt was tripodding and diaphoretic

## 2011-07-06 NOTE — ED Notes (Signed)
Pt wants to speak with case management once he arrives on the floor in regards to medicaid.

## 2011-07-06 NOTE — ED Notes (Signed)
When care assumed pt was not on bipap and was tolerating nasal canula at 4L.

## 2011-07-06 NOTE — ED Notes (Signed)
Pt moved from 17 to 2, off BiPAP now to 4L Philipp Deputy

## 2011-07-07 ENCOUNTER — Inpatient Hospital Stay (HOSPITAL_COMMUNITY): Payer: Self-pay

## 2011-07-07 ENCOUNTER — Other Ambulatory Visit: Payer: Self-pay

## 2011-07-07 DIAGNOSIS — E876 Hypokalemia: Secondary | ICD-10-CM | POA: Diagnosis present

## 2011-07-07 LAB — CBC
HCT: 37.5 % — ABNORMAL LOW (ref 39.0–52.0)
MCV: 85 fL (ref 78.0–100.0)
RBC: 4.41 MIL/uL (ref 4.22–5.81)
RDW: 14.6 % (ref 11.5–15.5)
WBC: 11.3 10*3/uL — ABNORMAL HIGH (ref 4.0–10.5)

## 2011-07-07 LAB — CARDIAC PANEL(CRET KIN+CKTOT+MB+TROPI)
CK, MB: 3 ng/mL (ref 0.3–4.0)
Relative Index: 2.3 (ref 0.0–2.5)
Troponin I: <0.3 ng/mL (ref <0.30)

## 2011-07-07 LAB — BASIC METABOLIC PANEL
CO2: 28 mEq/L (ref 19–32)
Chloride: 101 mEq/L (ref 96–112)
GFR calc Af Amer: 89 mL/min — ABNORMAL LOW (ref >90)
Potassium: 3 mEq/L — ABNORMAL LOW (ref 3.5–5.1)
Sodium: 138 mEq/L (ref 135–145)

## 2011-07-07 MED ORDER — BENZONATATE 100 MG PO CAPS
100.0000 mg | ORAL_CAPSULE | Freq: Three times a day (TID) | ORAL | Status: DC | PRN
  Administered 2011-07-07 – 2011-07-08 (×2): 100 mg via ORAL
  Filled 2011-07-07 (×3): qty 1

## 2011-07-07 MED ORDER — POTASSIUM CHLORIDE CRYS ER 20 MEQ PO TBCR
40.0000 meq | EXTENDED_RELEASE_TABLET | Freq: Once | ORAL | Status: AC
  Administered 2011-07-07: 40 meq via ORAL
  Filled 2011-07-07: qty 2

## 2011-07-07 NOTE — Progress Notes (Signed)
States "I can't breathe." In room with assessment. Lung sounds with rales. Dr. Cena Benton notified and orders given for Lasix 40mg  Iv.B/p 140/92. Heart rate 92. Sat 98% on 2 liters resp 24

## 2011-07-07 NOTE — Progress Notes (Signed)
Pt given lasix 40mg  for rales and sob per MD order.  Will continue to monitor.

## 2011-07-07 NOTE — Progress Notes (Signed)
07/07/2011 [name] SPARKS Case Management Note [phone]  Utilization review completed.  

## 2011-07-07 NOTE — Progress Notes (Signed)
Subjective: Patient has been complaining of shortness of breath today.  Has been given lasix recently and has noticed improvement with this medication.  States that he has had urine output with medication.  Denies any hemoptysis, productive cough, fevers or chills.  Objective: Filed Vitals:   07/06/11 2330 07/07/11 0401 07/07/11 0925 07/07/11 1436  BP: 115/85 124/84 121/84 109/70  Pulse: 74 92  89  Temp:  97.7 F (36.5 C)  98.7 F (37.1 C)  TempSrc:  Oral  Oral  Resp: 18 18  18   Height:      Weight:  61.2 kg (134 lb 14.7 oz)    SpO2: 99% 99%  98%   Weight change:   Intake/Output Summary (Last 24 hours) at 07/07/11 1827 Last data filed at 07/07/11 1438  Gross per 24 hour  Intake    726 ml  Output   1300 ml  Net   -574 ml    General: Alert, awake, oriented x3, in no acute distress.  HEENT: No bruits, no goiter.  Heart: Regular rate and rhythm, without murmurs, rubs, gallops.  Lungs: Rhales diffusely, no wheezes Abdomen: Soft, nontender, nondistended, positive bowel sounds.  Neuro: Grossly intact, nonfocal.   Lab Results:  Basename 07/07/11 0605 07/06/11 1429 07/06/11 0756  NA 138 -- 141  K 3.0* -- 3.7  CL 101 -- 105  CO2 28 -- 27  GLUCOSE 90 -- 136*  BUN 16 -- 14  CREATININE 1.05 0.94 --  CALCIUM 8.9 -- 9.4  MG -- -- --  PHOS -- -- --    Basename 07/06/11 0756 07/06/11 0226  AST 29 42*  ALT 27 32  ALKPHOS 105 134*  BILITOT 0.4 0.2*  PROT 6.5 6.8  ALBUMIN 3.1* 3.3*   No results found for this basename: LIPASE:2,AMYLASE:2 in the last 72 hours  Basename 07/07/11 0605 07/06/11 1429 07/06/11 0226  WBC 11.3* 6.2 --  NEUTROABS -- -- 4.6  HGB 12.5* 13.5 --  HCT 37.5* 41.0 --  MCV 85.0 84.7 --  PLT 192 214 --    Basename 07/07/11 0605 07/06/11 2124 07/06/11 1430  CKTOTAL 129 163 183  CKMB 3.0 3.8 4.3*  CKMBINDEX -- -- --  TROPONINI <0.30 <0.30 <0.30   No components found with this basename: POCBNP:3 No results found for this basename: DDIMER:2 in the  last 72 hours No results found for this basename: HGBA1C:2 in the last 72 hours No results found for this basename: CHOL:2,HDL:2,LDLCALC:2,TRIG:2,CHOLHDL:2,LDLDIRECT:2 in the last 72 hours  Basename 07/06/11 0756  TSH 0.816  T4TOTAL --  T3FREE --  THYROIDAB --   No results found for this basename: VITAMINB12:2,FOLATE:2,FERRITIN:2,TIBC:2,IRON:2,RETICCTPCT:2 in the last 72 hours  Micro Results: No results found for this or any previous visit (from the past 240 hour(s)).  Studies/Results: Dg Chest Port 1 View  07/07/2011  *RADIOLOGY REPORT*  Clinical Data: Bilateral airspace disease  PORTABLE CHEST - 1 VIEW  Comparison:   the previous day's study  Findings: Heart size upper limits normal.  Bilateral interstitial and airspace opacities, slightly increased on the right and marginally improved on the left since previous exam.  No definite effusion.  Regional bones unremarkable.  IMPRESSION:  1.  No significant overall change in bilateral edema/infiltrates.  Original Report Authenticated By: Osa Craver, M.D.   Dg Chest Portable 1 View  07/06/2011  *RADIOLOGY REPORT*  Clinical Data: Respiratory distress.  History of COPD.  PORTABLE CHEST - 1 VIEW  Comparison: 05/04/2011  Findings: Single view chest demonstrates patchy  bilateral airspace disease, particularly in the right lower lung.  Heart size is stable and upper limits of normal.  Trachea is midline.  No evidence for pneumothorax.  Stable lucency in the medial right lung apex probably represents a bleb or bulla.  IMPRESSION: Bilateral airspace disease is suggestive for pulmonary edema versus multifocal pneumonia.  Original Report Authenticated By: Richarda Overlie, M.D.    Medications: I have reviewed the patient's current medications.   Patient Active Hospital Problem List: Acute respiratory failure with hypoxia (05/03/2011)  Currently improving on Lasix.  Patient was complaining of some increased in SOB today during rounds and I gave him  an extra dose of Lasix.  Just called patient now at 1830 and he reports that he feels much better.  Given response to diuretics despite that chest x ray reads Pulmonary Edema vs multifocal pneumonia, patient has improved on diuretics alone and index of suspicion for PNA is low given that he has been afebrile.  Should his WBC count get more elevated tomorrow and patient develop productive cough would consider adding antibiotics.  Hypokalemia:  At this point will plan on replacing orally.  Will recheck tomorrow.  SUBSTANCE ABUSE, MULTIPLE (03/25/2010) Has been counseled against utilizing cocaine.  HYPERTENSION (03/25/2010) Currently stable and well controlled.  Chronic systolic congestive heart failure, NYHA class 3 (05/05/2011) As indicated in prior notes etiology is nonischemic cardiomyopathy likely due to rpolonged history of uncontrolled hypertension as well as ongoing use of cocaine -January 2012 cardiac catheterization noted - the cardiologist recommended medical therapy Continue home medications of lisinopril as well as Lasix which currently is being administered IV route   Flash pulmonary edema (07/06/2011) Clinical condition is improving on Lasix IV and cessation of cocaine use.  Non-occlusive coronary artery disease (07/06/2011) Had cardiac catheterization in January 2012 that showed diffuse nonobstructive coronary disease involving a 50-70% diffuse lesion at the distal RCA as well as a 50% lesion in the PDA and a diffuse 50% lesion in the LAD  *Because of ongoing use of cocaine cannot use beta blocker therapy unopposed - if needed could initiate carvedilol Abnormal EKG (07/06/2011) Cardiac enzymes have been negative times 3.  Currently patient is stable.  Chest pain (07/06/2011) Resolved will update problem list.   LOS: 1 day   Penny Pia M.D.  [facility] 07/07/2011, 6:27 PM

## 2011-07-08 ENCOUNTER — Inpatient Hospital Stay (HOSPITAL_COMMUNITY): Payer: Self-pay

## 2011-07-08 ENCOUNTER — Encounter (HOSPITAL_COMMUNITY): Payer: Self-pay | Admitting: Physician Assistant

## 2011-07-08 DIAGNOSIS — I5023 Acute on chronic systolic (congestive) heart failure: Secondary | ICD-10-CM

## 2011-07-08 LAB — BASIC METABOLIC PANEL
CO2: 28 mEq/L (ref 19–32)
Chloride: 99 mEq/L (ref 96–112)
GFR calc Af Amer: >90 mL/min (ref >90)
Potassium: 3.8 mEq/L (ref 3.5–5.1)
Sodium: 137 mEq/L (ref 135–145)

## 2011-07-08 LAB — CBC
Hemoglobin: 14.1 g/dL (ref 13.0–17.0)
MCH: 27.8 pg (ref 26.0–34.0)
MCHC: 33.3 g/dL (ref 30.0–36.0)
Platelets: 219 10*3/uL (ref 150–400)
RBC: 5.07 MIL/uL (ref 4.22–5.81)

## 2011-07-08 MED ORDER — ASPIRIN EC 81 MG PO TBEC
81.0000 mg | DELAYED_RELEASE_TABLET | Freq: Every day | ORAL | Status: DC
  Administered 2011-07-09: 81 mg via ORAL
  Filled 2011-07-08: qty 1

## 2011-07-08 MED ORDER — FUROSEMIDE 10 MG/ML IJ SOLN
40.0000 mg | Freq: Every day | INTRAMUSCULAR | Status: DC
  Administered 2011-07-08: 40 mg via INTRAVENOUS
  Filled 2011-07-08 (×2): qty 4

## 2011-07-08 MED ORDER — LISINOPRIL 2.5 MG PO TABS
2.5000 mg | ORAL_TABLET | Freq: Every day | ORAL | Status: DC
  Administered 2011-07-09: 2.5 mg via ORAL
  Filled 2011-07-08: qty 1

## 2011-07-08 MED ORDER — CARVEDILOL 3.125 MG PO TABS
3.1250 mg | ORAL_TABLET | Freq: Two times a day (BID) | ORAL | Status: DC
  Administered 2011-07-09: 3.125 mg via ORAL
  Filled 2011-07-08 (×4): qty 1

## 2011-07-08 MED ORDER — DIGOXIN 125 MCG PO TABS
0.1250 mg | ORAL_TABLET | Freq: Every day | ORAL | Status: DC
  Administered 2011-07-08 – 2011-07-09 (×2): 0.125 mg via ORAL
  Filled 2011-07-08 (×2): qty 1

## 2011-07-08 MED ORDER — FUROSEMIDE 40 MG PO TABS
40.0000 mg | ORAL_TABLET | Freq: Once | ORAL | Status: AC
  Administered 2011-07-09: 40 mg via ORAL
  Filled 2011-07-08: qty 1

## 2011-07-08 NOTE — Consult Note (Signed)
Advanced Heart Failure Team Consult Note  Referring Physician: Dr. Thedore Mins Primary Physician: [facility] on [address] Primary Cardiologist: none  Reason for Consultation: Heart failure   HPI:    Mr. Danielsen is a 61 y.o. African American gentlemen with history of nonobstructive CAD by cath in Jan 2012, NICM EF 20-25% , COPD, and substance abuse, including cocaine abuse and alcohol and tobacco use.    He presented to the emergency department with complaints of acute dyspnea, orthopnea and PND.  He was given IV lasix en route by EMS.  Upon arrival to [facility] he required BiPap for hypoxemia.  He was started on Solu-medrol and albuterol nebulizers for possible COPD exacerbation.  CXR showed pulmonary edema and proBNP 2113. He was weaned off Bipap over 24 hours.  He was continued on IV lasix until today.  His symptoms are much improved.   Patient endorses using cocaine the day before his episode.  He says he was compliant with most of his medications.  He does not weigh himself.    He lives alone. Previously worked as an Personnel officer but now on disability. At baseline can only walk about a bloch and has to stop due to dyspnea and fatigue. Follows with [facility] in [place]. Says he goes there every 3 months. Says he takes his medicines and is able to name most of them by name and dose and tell me what they are for. Denies edema, orthopnea or PND as long as he takes his lasix. Has been trying to quite ETOH and drug use. Currently reports drinking one 40 oz beer per day because he doesn't like domestic beer,    Review of Systems: [y] = yes, [ ]  = no   General: Weight gain [ ] ; Weight loss [ ] ; Anorexia [ ] ; Fatigue [ ] ; Fever [ ] ; Chills [ ] ; Weakness [ ]   Cardiac: Chest pain/pressure [ ] ; Resting SOB [ ] ; Exertional SOB Cove.Etienne ]; Orthopnea Cove.Etienne ]; Pedal Edema [ ] ; Palpitations [ ] ; Syncope [ ] ; Presyncope [ ] ; Paroxysmal nocturnal dyspnea[ y]  Pulmonary: Cough [ y]; Wheezing[ ] ; Hemoptysis[ ] ; Sputum [ ] ;  Snoring [ ]   GI: Vomiting[ ] ; Dysphagia[ ] ; Melena[ ] ; Hematochezia [ ] ; Heartburn[ ] ; Abdominal pain [ ] ; Constipation [ ] ; Diarrhea [ ] ; BRBPR [ ]   GU: Hematuria[ ] ; Dysuria [ ] ; Nocturia[ ]   Vascular: Pain in legs with walking [ ] ; Pain in feet with lying flat [ ] ; Non-healing sores [ ] ; Stroke [ ] ; TIA [ ] ; Slurred speech [ ] ;  Neuro: Headaches[ ] ; Vertigo[ ] ; Seizures[ ] ; Paresthesias[ ] ;Blurred vision [ ] ; Diplopia [ ] ; Vision changes [ ]   Ortho/Skin: Arthritis [ ] ; Joint pain [ ] ; Muscle pain [ ] ; Joint swelling [ ] ; Back Pain [ ] ; Rash [ ]   Psych: Depression[ ] ; Anxiety[ ]   Heme: Bleeding problems [ ] ; Clotting disorders [ ] ; Anemia [ ]   Endocrine: Diabetes [ ] ; Thyroid dysfunction[ ]   Home Medications Prior to Admission medications   Medication Sig Start Date End Date Taking? Authorizing Provider  aspirin 81 MG chewable tablet Chew 1 tablet (81 mg total) by mouth daily. 05/05/11 05/04/12 Yes Belkys Regalado, MD  furosemide (LASIX) 40 MG tablet Take 1 tablet (40 mg total) by mouth daily. 05/05/11 05/04/12 Yes Belkys Regalado, MD  lisinopril (PRINIVIL,ZESTRIL) 5 MG tablet Take 1 tablet (5 mg total) by mouth daily. 05/05/11 05/04/12 Yes Belkys Regalado, MD  simvastatin (ZOCOR) 40 MG tablet Take 40 mg by mouth  at bedtime.     Yes Historical Provider, MD    Past Medical History: Past Medical History  Diagnosis Date  . Hypertension   . Chronic systolic heart failure   . Substance abuse   . CAD (coronary artery disease)     nonobstructive    Past Surgical History: Past Surgical History  Procedure Date  . Joint replacement L knee    Family History: Family History  Problem Relation Age of Onset  . Malignant hyperthermia Mother     Social History: History   Social History  . Marital Status: Divorced    Spouse Name: N/A    Number of Children: N/A  . Years of Education: N/A   Social History Main Topics  . Smoking status: Current Everyday Smoker -- 0.2 packs/day for 30  years    Types: Cigarettes  . Smokeless tobacco: Never Used  . Alcohol Use: No  . Drug Use: 1 per week    Special: Cocaine     pt unable to recollect last usage  . Sexually Active: Not Currently   Other Topics Concern  . None   Social History Narrative  . None  Drinks 40 oz of beer a week.  Use to work as an Personnel officer but is now on disability.  He lives at home by himself and is able to complete ADLs.   Allergies:  No Known Allergies  Objective:    Vital Signs:   Temp:  [98.7 F (37.1 C)-99.6 F (37.6 C)] 98.7 F (37.1 C) (02/27 1346) Pulse Rate:  [80-87] 87  (02/27 1346) Resp:  [18-20] 20  (02/27 1346) BP: (103-132)/(82-83) 103/83 mmHg (02/27 1346) SpO2:  [96 %-97 %] 96 % (02/27 1346) Weight:  [60.6 kg (133 lb 9.6 oz)] 60.6 kg (133 lb 9.6 oz) (02/27 0510) Last BM Date: 07/08/11  24-hour weight change: Weight change: -0.636 kg (-1 lb 6.4 oz)  Intake/Output:   Intake/Output Summary (Last 24 hours) at 07/08/11 1449 Last data filed at 07/08/11 1319  Gross per 24 hour  Intake   1300 ml  Output   1240 ml  Net     60 ml     Physical Exam: General: thin no acute distress No resp difficulty HEENT: normal poor dentition Neck: supple. JVP 5 . Carotids 2+ bilat; no bruits. No lymphadenopathy or thryomegaly appreciated. Cor: PMI laterally displaced. Regular rate & rhythm. No rubs, gallops or murmurs. Lungs: clear Abdomen: soft, nontender, nondistended. No hepatosplenomegaly. No bruits or masses. Good bowel sounds. Extremities: no cyanosis, rash, edema +clubbing Neuro: alert & orientedx3, cranial nerves grossly intact. moves all 4 extremities w/o difficulty. Affect pleasant    Labs: Basic Metabolic Panel:  Lab 07/08/11 1610 07/07/11 0605 07/06/11 1429 07/06/11 0756 07/06/11 0226  NA 137 138 -- 141 140  K 3.8 3.0* -- 3.7 3.5  CL 99 101 -- 105 104  CO2 28 28 -- 27 26  GLUCOSE 96 90 -- 136* 144*  BUN 15 16 -- 14 14  CREATININE 0.96 1.05 0.94 1.04 1.12  CALCIUM  9.4 8.9 -- 9.4 --  MG -- -- -- -- --  PHOS -- -- -- -- --    Liver Function Tests:  Lab 07/06/11 0756 07/06/11 0226  AST 29 42*  ALT 27 32  ALKPHOS 105 134*  BILITOT 0.4 0.2*  PROT 6.5 6.8  ALBUMIN 3.1* 3.3*   No results found for this basename: LIPASE:5,AMYLASE:5 in the last 168 hours No results found for this basename: AMMONIA:3 in  the last 168 hours  CBC:  Lab 07/08/11 0606 07/07/11 0605 07/06/11 1429 07/06/11 0756 07/06/11 0226  WBC 9.0 11.3* 6.2 7.2 9.3  NEUTROABS -- -- -- -- 4.6  HGB 14.1 12.5* 13.5 14.1 14.6  HCT 42.4 37.5* 41.0 42.3 43.7  MCV 83.6 85.0 84.7 84.8 84.5  PLT 219 192 214 225 266    Cardiac Enzymes:  Lab 07/07/11 0605 07/06/11 2124 07/06/11 1430 07/06/11 0226  CKTOTAL 129 163 183 153  CKMB 3.0 3.8 4.3* 3.4  CKMBINDEX -- -- -- --  TROPONINI <0.30 <0.30 <0.30 <0.30    BNP: No components found with this basename: POCBNP:5  CBG: No results found for this basename: GLUCAP:5 in the last 168 hours  Coagulation Studies: No results found for this basename: LABPROT:5,INR:5 in the last 72 hours  Other results: EKG: Sinus rhythm with LVH and repol  Imaging: Dg Chest 2 View  07/08/2011  *RADIOLOGY REPORT*  Clinical Data: Shortness of breath and cough.  CHEST - 2 VIEW  Comparison: 07/07/2011.  Findings: The heart is mildly enlarged but stable.  The mediastinal and hilar contours are within normal limits and unchanged.  The lungs are much better aerated with near complete resolution of pulmonary edema.  There are small bilateral pleural effusions. Stable underlying emphysematous changes.  IMPRESSION:  1.  Much improved lung aeration with near complete resolution of pulmonary edema. 2.  Small bilateral pleural effusions.  Original Report Authenticated By: P. Loralie Champagne, M.D.   Dg Chest Port 1 View  07/07/2011  *RADIOLOGY REPORT*  Clinical Data: Bilateral airspace disease  PORTABLE CHEST - 1 VIEW  Comparison:   the previous day's study  Findings: Heart  size upper limits normal.  Bilateral interstitial and airspace opacities, slightly increased on the right and marginally improved on the left since previous exam.  No definite effusion.  Regional bones unremarkable.  IMPRESSION:  1.  No significant overall change in bilateral edema/infiltrates.  Original Report Authenticated By: Osa Craver, M.D.     Medications:     Current Medications:    . aspirin EC  325 mg Oral Daily  . enoxaparin  40 mg Subcutaneous Q24H  . furosemide  40 mg Intravenous Daily  . furosemide  40 mg Oral Once  . lisinopril  5 mg Oral Daily  . nitroGLYCERIN  0.4 mg Transdermal Daily  . potassium chloride  40 mEq Oral Once  . simvastatin  40 mg Oral QHS  . sodium chloride  3 mL Intravenous Q12H  . DISCONTD: furosemide  40 mg Intravenous Daily    Infusions:     Assessment:  1. A/C systolic HF 2. NICM EF 20-25% 3. Acute/chronic respiratory failure  4. COPD with ongoing tobacco use 5. Polysubstance abuse - cocaine + this admit 6. Medical noncompliance 7. Nonobstructive CAD  Plan/Discussion:    Ulyess Blossom, PA-C  07/08/2011, 2:49 PM    Patient seen and examined with Ulyess Blossom PA-C. We discussed all aspects of the encounter. I agree with the assessment and plan as stated above.   Attending: Volume status improved with diuresis. Back to baseline NYHA III with markedly depressed EF. We had a long talk about his situation and I conveyed to him that given his severe LV dysfunction and polysubstnce abuse he is at extremely high risk of dying over the next 6-12 months. We discussed the role of the HF clinic at length with regard to medication assistance and also improvements in his LV function and symptoms. We  also counseled him on need to abstain from cocaine, ETOH and tobacco. He has expressed interest in coming to clinic and trying to get better. I feel he is somewhat sincere about this but I remain dubious that he will actually be able to pull it  off on d/c. We will provide with contact information for the clinic and an appt for next week. We will also f/u with a phone call.  For now. Agree with switching back to lasix 40 daily. Continue lisinopril. Would start carvedilol 3.125 mg po bid (has alpha blocker in it so don't have to worry about unopposed b-blockade). Add digoxin 0.125 mg daily.  Would not start spiro until we are convinced he will f/u. Not ICD candidate at this point due to noncompliance and poly substance use.   We will see him again in am. Probably ready for d/c tomorrow.   Jasiyah Poland,MD 3:08 PM

## 2011-07-08 NOTE — Progress Notes (Signed)
[name] [id number] is a 61 y.o. male,  Outpatient Primary MD for the patient is No primary provider on file.  Chief Complaint  Patient presents with  . Respiratory Distress        Subjective:   Micheline Rough today has, No headache, No chest pain, No abdominal pain - No Nausea, No new weakness tingling or numbness, No Cough - SOB.    Objective:   Filed Vitals:   07/07/11 1431 07/07/11 1436 07/07/11 2048 07/08/11 0510  BP: 130/92 109/70 132/82 121/83  Pulse: 90 89 80 87  Temp:  98.7 F (37.1 C) 99.5 F (37.5 C) 99.6 F (37.6 C)  TempSrc:  Oral Oral Oral  Resp:  18 18 18   Height:      Weight:    60.6 kg (133 lb 9.6 oz)  SpO2:  98% 97% 96%    Wt Readings from Last 3 Encounters:  07/08/11 60.6 kg (133 lb 9.6 oz)  05/05/11 63.5 kg (139 lb 15.9 oz)     Intake/Output Summary (Last 24 hours) at 07/08/11 1028 Last data filed at 07/08/11 0900  Gross per 24 hour  Intake   1060 ml  Output   1940 ml  Net   -880 ml    Exam Awake Alert, Oriented *3, No new F.N deficits, Normal affect [place].AT,PERRAL Supple Neck,No JVD, No cervical lymphadenopathy appriciated.  Symmetrical Chest wall movement, Good air movement bilaterally, CTAB RRR,No Gallops,Rubs or new Murmurs, No Parasternal Heave +ve B.Sounds, Abd Soft, Non tender, No organomegaly appriciated, No rebound -guarding or rigidity. No Cyanosis, Clubbing or edema, No new Rash or bruise    Data Review  CBC  Lab 07/08/11 0606 07/07/11 0605 07/06/11 1429 07/06/11 0756 07/06/11 0226  WBC 9.0 11.3* 6.2 7.2 9.3  HGB 14.1 12.5* 13.5 14.1 14.6  HCT 42.4 37.5* 41.0 42.3 43.7  PLT 219 192 214 225 266  MCV 83.6 85.0 84.7 84.8 84.5  MCH 27.8 28.3 27.9 28.3 28.2  MCHC 33.3 33.3 32.9 33.3 33.4  RDW 14.6 14.6 14.7 14.7 14.7  LYMPHSABS -- -- -- -- 3.7  MONOABS -- -- -- -- 0.8  EOSABS -- -- -- -- 0.2  BASOSABS -- -- -- -- 0.0  BANDABS -- -- -- -- --    Chemistries   Lab 07/08/11 0606 07/07/11 0605 07/06/11  1429 07/06/11 0756 07/06/11 0226  NA 137 138 -- 141 140  K 3.8 3.0* -- 3.7 3.5  CL 99 101 -- 105 104  CO2 28 28 -- 27 26  GLUCOSE 96 90 -- 136* 144*  BUN 15 16 -- 14 14  CREATININE 0.96 1.05 0.94 1.04 1.12  CALCIUM 9.4 8.9 -- 9.4 9.5  MG -- -- -- -- --  AST -- -- -- 29 42*  ALT -- -- -- 27 32  ALKPHOS -- -- -- 105 134*  BILITOT -- -- -- 0.4 0.2*   ------------------------------------------------------------------------------------------------------------------ estimated creatinine clearance is 71.9 ml/min (by C-G formula based on Cr of 0.96). ------------------------------------------------------------------------------------------------------------------ No results found for this basename: HGBA1C:2 in the last 72 hours ------------------------------------------------------------------------------------------------------------------ No results found for this basename: CHOL:2,HDL:2,LDLCALC:2,TRIG:2,CHOLHDL:2,LDLDIRECT:2 in the last 72 hours ------------------------------------------------------------------------------------------------------------------  [facility] 07/06/11 0756  TSH 0.816  T4TOTAL --  T3FREE --  THYROIDAB --   ------------------------------------------------------------------------------------------------------------------ No results found for this basename: VITAMINB12:2,FOLATE:2,FERRITIN:2,TIBC:2,IRON:2,RETICCTPCT:2 in the last 72 hours  Coagulation profile No results found for this basename: INR:5,PROTIME:5 in the last 168 hours  No results found for this basename: DDIMER:2 in the last 72 hours  Cardiac Enzymes  Lab 07/07/11 0605 07/06/11 2124 07/06/11 1430  CKMB 3.0 3.8 4.3*  TROPONINI <0.30 <0.30 <0.30  MYOGLOBIN -- -- --   ------------------------------------------------------------------------------------------------------------------ No components found with this basename: POCBNP:3  Micro Results No results found for this or any previous visit  (from the past 240 hour(s)).  Radiology Reports Dg Chest [place] 1 View  07/07/2011  *RADIOLOGY REPORT*  Clinical Data: Bilateral airspace disease  PORTABLE CHEST - 1 VIEW  Comparison:   the previous day's study  Findings: Heart size upper limits normal.  Bilateral interstitial and airspace opacities, slightly increased on the right and marginally improved on the left since previous exam.  No definite effusion.  Regional bones unremarkable.  IMPRESSION:  1.  No significant overall change in bilateral edema/infiltrates.  Original Report Authenticated By: Osa Craver, M.D.   Dg Chest Portable 1 View  07/06/2011  *RADIOLOGY REPORT*  Clinical Data: Respiratory distress.  History of COPD.  PORTABLE CHEST - 1 VIEW  Comparison: 05/04/2011  Findings: Single view chest demonstrates patchy bilateral airspace disease, particularly in the right lower lung.  Heart size is stable and upper limits of normal.  Trachea is midline.  No evidence for pneumothorax.  Stable lucency in the medial right lung apex probably represents a bleb or bulla.  IMPRESSION: Bilateral airspace disease is suggestive for pulmonary edema versus multifocal pneumonia.  Original Report Authenticated By: Richarda Overlie, M.D.     Echo Dec 2012  Left ventricle: Borderline dilated LV. There is an apical false tendon (normal variant). Wall thickness was normal. Systolic function was severely reduced. The estimated ejection fraction was in the range of 20% to 25%. There is severe inferior, inferoseptal, distal septal, [place] apical akinesis consistent with large dominant LCx or RCA/LCx territory infarct. Doppler parameters are consistent with restrictive left ventricular relaxation (grade3 diastolic dysfunction). The E/A ratio is >2. The E/e' ratio is >25, suggesting markedly elevated LV filling pressure. The MV decel time is 151 msec. - Mitral valve: Mildly thickened leaflets . Mild regurgitation. - Left atrium: Severely dilated  (50 ml/m2). - Inferior vena cava: The vessel was dilated; the respirophasic diameter changes were blunted (< 50%); findings are consistent with elevated central venous pressure -however, the patient is on positive pressure ventilation. - Pericardium, extracardiac: A trivial pericardial effusion was identified.    Scheduled Meds:   . aspirin EC  325 mg Oral Daily  . enoxaparin  40 mg Subcutaneous Q24H  . furosemide  40 mg Intravenous Daily  . furosemide  40 mg Oral Once  . lisinopril  5 mg Oral Daily  . nitroGLYCERIN  0.4 mg Transdermal Daily  . potassium chloride  40 mEq Oral Once  . simvastatin  40 mg Oral QHS  . sodium chloride  3 mL Intravenous Q12H  . DISCONTD: furosemide  40 mg Intravenous Daily   Continuous Infusions:  PRN Meds:.acetaminophen, acetaminophen, albuterol, benzonatate, LORazepam, ondansetron (ZOFRAN) IV, ondansetron  Assessment & Plan   Acute respiratory failure with hypoxia - Flash pulmonary edema - due to Acute on Chr Syst CHF Non Ischemic with Ongoing Cocaine Abuse -  Currently improving on Lasix switch to PO from tomorow. No cough-fever or SOB now, on RA, monitor, check 2 view CXR for CHF. Ef 20% Dec 2012, continue ACE, CHF team called. May need Aldactone, poor candidate  for B Blocker due to cocaine abuse.    SUBSTANCE ABUSE, MULTIPLE (03/25/2010) Has been counseled against utilizing cocaine.   HYPERTENSION (03/25/2010) Currently stable and well  controlled.   Chronic systolic congestive heart failure, NYHA class 3 (05/05/2011) As indicated in prior notes etiology is nonischemic cardiomyopathy likely due to prolonged history of uncontrolled hypertension as well as ongoing use of cocaine , January 2012 cardiac catheterization noted - the cardiologist recommended medical therapy, Rx as in #1 above.   Non-occlusive coronary artery disease (07/06/2011) Had cardiac catheterization in January 2012 that showed diffuse nonobstructive coronary disease  involving a 50-70% diffuse lesion at the distal RCA as well as a 50% lesion in the PDA and a diffuse 50% lesion in the LAD   Because of ongoing use of cocaine cannot use beta blocker therapy    H/O Chest Pain and Abnormal EKG (07/06/2011) Cardiac enzymes have been negative times 3. Currently patient is stable. And Chest Pain free.        DVT Prophylaxis  Lovenox    See all Orders from today for further details     Leroy Sea M.D on 07/08/2011 at 10:28 AM  [facility] Office  [phone]

## 2011-07-09 LAB — BASIC METABOLIC PANEL
CO2: 28 mEq/L (ref 19–32)
Chloride: 100 mEq/L (ref 96–112)
Glucose, Bld: 96 mg/dL (ref 70–99)
Potassium: 4 mEq/L (ref 3.5–5.1)
Sodium: 136 mEq/L (ref 135–145)

## 2011-07-09 MED ORDER — CARVEDILOL 3.125 MG PO TABS: 3.1250 mg | ORAL_TABLET | Freq: Two times a day (BID) | ORAL | Status: DC

## 2011-07-09 MED ORDER — DIGOXIN 125 MCG PO TABS: 0.1250 mg | ORAL_TABLET | Freq: Every day | ORAL | Status: DC

## 2011-07-09 NOTE — Evaluation (Signed)
Physical Therapy Evaluation Patient Details Name: [name] MRN: [id number] DOB: April 22, 1951 Today's Date: 07/09/2011  Problem List:  Patient Active Problem List  Diagnoses  . SUBSTANCE ABUSE, MULTIPLE  . HYPERTENSION  . Acute respiratory failure with hypoxia  . Chronic systolic congestive heart failure, NYHA class 3  . Flash pulmonary edema  . Non-occlusive coronary artery disease  . Abnormal EKG  . Hypokalemia  . Acute on chronic systolic heart failure    Past Medical History:  Past Medical History  Diagnosis Date  . Hypertension   . Chronic systolic heart failure   . Substance abuse   . CAD (coronary artery disease)     nonobstructive   Past Surgical History:  Past Surgical History  Procedure Date  . Joint replacement L knee    PT Assessment/Plan/Recommendation PT Assessment  H & P: 61 year old male with no history of past's abuse including cocaine history of nonischemic cardiomyopathy last EF measured in December 2012 was 20-25% history of nonobstructive coronary disease as per cardiac catheter in January 2012 was brought in the ER the patient developed sudden onset of severe shortness of breath last midnight with chest pain which was retrosternal. EMS en route gave Lasix IV and when patient reached ER he was short of breath was placed on BiPAP and given Solu-Medrol and albuterol nebulizers. Patient's cardiac enzymes were negative EKG showed normal sinus rhythm with early repolarization changes. Chest x-ray showing features of pulmonary edema. Patient's condition improved gradually with diuresis and BiPAP. Presently patient is not in distress but continued to be on BiPAP. Patient is chest pain-free. Patient admits to taking cocaine 2 days ago. Denies any fever chills cough or phlegm denies any nausea vomiting abdominal pain dysuria or discharge is a focal deficits.  Clinical Impression Statement:  Presents to PT at baseline with mobilty. No further PT needs identified  and PT signing off.   PT Recommendation/Assessment: Patent does not need any further PT services No Skilled PT: Patient at baseline level of functioning;Patient is independent with all acitivity/mobility;Patient is modified independent with all activity/mobility PT Recommendation Follow Up Recommendations: No PT follow up Equipment Recommended: None recommended by PT PT Goals     PT Evaluation Precautions/Restrictions    Prior Functioning  Home Living Lives With: Alone Type of Home: House Home Layout: One level Home Access: Stairs to enter Entrance Stairs-Rails: Right Entrance Stairs-Number of Steps: 5 Bathroom Toilet: Standard Home Adaptive Equipment: None Prior Function Level of Independence: Independent with basic ADLs;Independent with homemaking with ambulation;Independent with gait;Independent with transfers Driving: Yes Cognition Cognition Arousal/Alertness: Awake/alert Overall Cognitive Status: Impaired Cognition - Other Comments: pt appears a bit impulsive; doesn't appear to completely understand the severity of his medical situation Sensation/Coordination Sensation Light Touch: Appears Intact Coordination Gross Motor Movements are Fluid and Coordinated: Yes Fine Motor Movements are Fluid and Coordinated: Yes Extremity Assessment RUE Assessment RUE Assessment: Within Functional Limits LUE Assessment LUE Assessment: Within Functional Limits RLE Assessment RLE Assessment: Within Functional Limits LLE Assessment LLE Assessment: Within Functional Limits Mobility (including Balance) Bed Mobility Bed Mobility: Yes Supine to Sit: 7: Independent Transfers Transfers: Yes Sit to Stand: 7: Independent Stand to Sit: 7: Independent Ambulation/Gait Ambulation/Gait: Yes Ambulation/Gait Assistance: 7: Independent Ambulation/Gait Assistance Details (indicate cue type and reason): pt walks with slight antalgia secondary to prior TKA on left but no obvious balance  deficits during gait; good speed and ability to maneuver obstacles in his room Ambulation Distance (Feet): 250 Feet Assistive device: None Stairs: Yes Stairs  Assistance: 6: Modified independent (Device/Increase time) Stair Management Technique: One rail Left Number of Stairs: 10   Dynamic Standing Balance Dynamic Standing - Balance Support: No upper extremity supported Dynamic Standing - Level of Assistance: 7: Independent Dynamic Standing - Comments: pt able to don pants in standing with no LOB with SLS bilaterally Exercise    End of Session PT - End of Session Equipment Utilized During Treatment: Gait belt Activity Tolerance: Patient tolerated treatment well Patient left:  (standing in his room ready for d/c) General Behavior During Session: [facility] for tasks performed  [facility] 07/09/2011, 1:41 PM

## 2011-07-09 NOTE — Discharge Instructions (Signed)
Follow with Primary MD  in 7 days   Get CBC, CMP, checked 7 days by Primary MD and again as instructed by your Primary MD. Get a 2 view Chest X ray done next visit if you had Pneumonia of Lung problems at the Hospital.  Get Medicines reviewed and adjusted.  Please request your Prim.MD to go over all Hospital Tests and Procedure/Radiological results at the follow up, please get all Hospital records sent to your Prim MD by signing hospital release before you go home.  Follow-up Information    Follow up with Arvilla Meres, MD on 07/17/2011. (at 10:00a [employer] Code 6000))    Contact information:   [address] [place] [address] [phone]       Follow up with Primary MD. Schedule an appointment as soon as possible for a visit in 1 week.         Activity: Fall precautions use walker/cane & assistance as needed  Diet: Heart Healthy,  Fluid restriction 1.8 lit/day, Aspiration precautions.  Check your Weight same time everyday, if you gain over 2 pounds, or you develop in leg swelling, experience more shortness of breath or chest pain, call your Primary MD immediately. Follow Cardiac Low Salt Diet and 1.8 lit/day fluid restriction.  Disposition Home  If you experience worsening of your admission symptoms, develop shortness of breath, life threatening emergency, suicidal or homicidal thoughts you must seek medical attention immediately by calling 911 or calling your MD immediately  if symptoms less severe.  You Must read complete instructions/literature along with all the possible adverse reactions/side effects for all the Medicines you take and that have been prescribed to you. Take any new Medicines after you have completely understood and accpet all the possible adverse reactions/side effects.   Do not drive if your were admitted for syncope or siezures until you have seen by Primary MD or a Neurologist and advised to drive.  Do not drive when taking  Pain medications.    Do not take more than prescribed Pain, Sleep and Anxiety Medications  Special Instructions: If you have smoked or chewed Tobacco  in the last 2 yrs please stop smoking, stop any regular Alcohol  and or any Recreational drug use.  Wear Seat belts while driving.

## 2011-07-09 NOTE — Progress Notes (Signed)
   CARE MANAGEMENT NOTE 07/09/2011  Patient:  [name], [name]   Account Number:  [id number]  Date Initiated:  07/09/2011  Documentation initiated by:  Tera Mater  Subjective/Objective Assessment:   61yo male admitted with SOB and Chest Pain.    Action/Plan:   Pt. stated he had difficulty obtaining a weigh scale. This NCM gave pt. a weigh scale from the [facility] program here at [facility] for HF patients.  Anticipated DC Date:  07/09/2011   Anticipated DC Plan:  HOME/SELF CARE      DC Planning Services [facility] Program Scales     Choice offered to / List presented to:             Status of service:  Completed, signed off Medicare Important Message given?  NO (If response is "NO", the following Medicare IM given date fields will be blank) Date Medicare IM given:   Date Additional Medicare IM given:    Discharge Disposition:  HOME/SELF CARE  Per UR Regulation:    Comments:

## 2011-07-09 NOTE — Discharge Summary (Signed)
[name], 61 y.o., DOB 1950-05-26, MRN [id number]. Admission date: 07/06/2011 Discharge Date 07/09/2011 Primary MD No primary provider on file. Admitting Physician Eduard Clos, MD  Admission Diagnosis  Cocaine abuse [305.60] Pulmonary edema [514] Dyspnea [786.09] resp distress   Discharge Diagnosis   Principal Problem:  *Acute respiratory failure with hypoxia Active Problems:  SUBSTANCE ABUSE, MULTIPLE  HYPERTENSION  Chronic systolic congestive heart failure, NYHA class 3  Flash pulmonary edema  Non-occlusive coronary artery disease  Abnormal EKG  Hypokalemia  Acute on chronic systolic heart failure    Past Medical History  Diagnosis Date  . Hypertension   . Chronic systolic heart failure   . Substance abuse   . CAD (coronary artery disease)     nonobstructive    Past Surgical History  Procedure Date  . Joint replacement L knee     Hospital Course See H&P, Labs, Consult and Test reports for all details in brief, patient was admitted for   Acute respiratory failure with hypoxia - Flash pulmonary edema - due to Acute on Chr Syst CHF Non Ischemic with Ongoing Cocaine Abuse -  Improved on Lasix switched to PO , now symptom free. No cough-fever or SOB now, on RA,  for CHF. Ef 20% Dec 2012, continue ACE, added Coreg and Dig per Cardiology. He will follow closely with Dr Gala Romney.   SUBSTANCE ABUSE, MULTIPLE (03/25/2010) Has been counseled against utilizing cocaine.    HYPERTENSION (03/25/2010) Currently stable and well controlled.    Chronic systolic congestive heart failure, NYHA class 3 (05/05/2011) As indicated in prior notes etiology is nonischemic cardiomyopathy likely due to prolonged history of uncontrolled hypertension as well as ongoing use of cocaine , January 2012 cardiac catheterization noted - the cardiologist recommended medical therapy, Rx as in #1 above.    Non-occlusive coronary artery disease (07/06/2011) Had cardiac catheterization  in January 2012 that showed diffuse nonobstructive coronary disease involving a 50-70% diffuse lesion at the distal RCA as well as a 50% lesion in the PDA and a diffuse 50% lesion in the LAD, continue present Med Rx with outpt Cards follow up.    H/O Chest Pain and Abnormal EKG (07/06/2011) Cardiac enzymes have been negative times 3. Currently patient is stable. And Chest Pain free.          Consults  Cardiology  Significant Tests:  See full reports for all details     Dg Chest 2 View  07/08/2011  *RADIOLOGY REPORT*  Clinical Data: Shortness of breath and cough.  CHEST - 2 VIEW  Comparison: 07/07/2011.  Findings: The heart is mildly enlarged but stable.  The mediastinal and hilar contours are within normal limits and unchanged.  The lungs are much better aerated with near complete resolution of pulmonary edema.  There are small bilateral pleural effusions. Stable underlying emphysematous changes.  IMPRESSION:  1.  Much improved lung aeration with near complete resolution of pulmonary edema. 2.  Small bilateral pleural effusions.  Original Report Authenticated By: P. Loralie Champagne, M.D.   Dg Chest Port 1 View  07/07/2011  *RADIOLOGY REPORT*  Clinical Data: Bilateral airspace disease  PORTABLE CHEST - 1 VIEW  Comparison:   the previous day's study  Findings: Heart size upper limits normal.  Bilateral interstitial and airspace opacities, slightly increased on the right and marginally improved on the left since previous exam.  No definite effusion.  Regional bones unremarkable.  IMPRESSION:  1.  No significant overall change in bilateral edema/infiltrates.  Original Report Authenticated  By: D. Melven Sartorius, M.D.   Dg Chest Portable 1 View  07/06/2011  *RADIOLOGY REPORT*  Clinical Data: Respiratory distress.  History of COPD.  PORTABLE CHEST - 1 VIEW  Comparison: 05/04/2011  Findings: Single view chest demonstrates patchy bilateral airspace disease, particularly in the right lower lung.   Heart size is stable and upper limits of normal.  Trachea is midline.  No evidence for pneumothorax.  Stable lucency in the medial right lung apex probably represents a bleb or bulla.  IMPRESSION: Bilateral airspace disease is suggestive for pulmonary edema versus multifocal pneumonia.  Original Report Authenticated By: Richarda Overlie, M.D.    Echo Dec 2012  Left ventricle: Borderline dilated LV. There is an apical false tendon (normal variant). Wall thickness was normal. Systolic function was severely reduced. The estimated ejection fraction was in the range of 20% to 25%. There is severe inferior, inferoseptal, distal septal, lateraland apical akinesis consistent with large dominant LCx or RCA/LCx territory infarct. Doppler parameters are consistent with restrictive left ventricular relaxation (grade3 diastolic dysfunction). The E/A ratio is >2. The E/e' ratio is >25, suggesting markedly elevated LV filling pressure. The MV decel time is 151 msec. - Mitral valve: Mildly thickened leaflets . Mild regurgitation. - Left atrium: Severely dilated (50 ml/m2). - Inferior vena cava: The vessel was dilated; the respirophasic diameter changes were blunted (< 50%); findings are consistent with elevated central venous pressure -however, the patient is on positive pressure ventilation. - Pericardium, extracardiac: A trivial pericardial effusion was identified.    Today   Subjective:   [name] today has no headache,no chest abdominal pain,no new weakness tingling or numbness, feels much better wants to go home today.    Objective:   Blood pressure 128/72, pulse 70, temperature 98.2 F (36.8 C), temperature source Oral, resp. rate 20, height 6\' 1"  (1.854 m), weight 60.737 kg (133 lb 14.4 oz), SpO2 96.00%.  Intake/Output Summary (Last 24 hours) at 07/09/11 1124 Last data filed at 07/09/11 1119  Gross per 24 hour  Intake   1743 ml  Output   1175 ml  Net    568 ml    Exam Awake  Alert, Oriented *3, No new F.N deficits, Normal affect Gem.AT,PERRAL Supple Neck,No JVD, No cervical lymphadenopathy appriciated.  Symmetrical Chest wall movement, Good air movement bilaterally, CTAB RRR,No Gallops,Rubs or new Murmurs, No Parasternal Heave +ve B.Sounds, Abd Soft, Non tender, No organomegaly appriciated, No rebound -guarding or rigidity. No Cyanosis, Clubbing or edema, No new Rash or bruise  Data Review      CBC w Diff: Lab Results  Component Value Date   WBC 9.0 07/08/2011   HGB 14.1 07/08/2011   HCT 42.4 07/08/2011   PLT 219 07/08/2011   LYMPHOPCT 40 07/06/2011   MONOPCT 9 07/06/2011   EOSPCT 2 07/06/2011   BASOPCT 0 07/06/2011   CMP: Lab Results  Component Value Date   NA 136 07/09/2011   K 4.0 07/09/2011   CL 100 07/09/2011   CO2 28 07/09/2011   BUN 17 07/09/2011   CREATININE 0.90 07/09/2011   PROT 6.5 07/06/2011   ALBUMIN 3.1* 07/06/2011   BILITOT 0.4 07/06/2011   ALKPHOS 105 07/06/2011   AST 29 07/06/2011   ALT 27 07/06/2011  .  Micro Results No results found for this or any previous visit (from the past 240 hour(s)).   Discharge Instructions      Follow with Primary MD  in 7 days   Get CBC, CMP, checked 7  days by Primary MD and again as instructed by your Primary MD. Get a 2 view Chest X ray done next visit if you had Pneumonia of Lung problems at the Hospital.  Get Medicines reviewed and adjusted.  Please request your Prim.MD to go over all Hospital Tests and Procedure/Radiological results at the follow up, please get all Hospital records sent to your Prim MD by signing hospital release before you go home.  Follow-up Information    Follow up with Arvilla Meres, MD on 07/17/2011. (at 10:00a [employer] Code 6000))    Contact information:   [address] [place] [address] [phone]       Follow up with Primary MD. Schedule an appointment as soon as possible for a visit in 1 week.         Activity: Fall precautions use  walker/cane & assistance as needed  Diet: Heart Healthy,  Fluid restriction 1.8 lit/day, Aspiration precautions.  Check your Weight same time everyday, if you gain over 2 pounds, or you develop in leg swelling, experience more shortness of breath or chest pain, call your Primary MD immediately. Follow Cardiac Low Salt Diet and 1.8 lit/day fluid restriction.  Disposition Home  If you experience worsening of your admission symptoms, develop shortness of breath, life threatening emergency, suicidal or homicidal thoughts you must seek medical attention immediately by calling 911 or calling your MD immediately  if symptoms less severe.  You Must read complete instructions/literature along with all the possible adverse reactions/side effects for all the Medicines you take and that have been prescribed to you. Take any new Medicines after you have completely understood and accpet all the possible adverse reactions/side effects.   Do not drive if your were admitted for syncope or siezures until you have seen by Primary MD or a Neurologist and advised to drive.  Do not drive when taking Pain medications.    Do not take more than prescribed Pain, Sleep and Anxiety Medications  Special Instructions: If you have smoked or chewed Tobacco  in the last 2 yrs please stop smoking, stop any regular Alcohol  and or any Recreational drug use.  Wear Seat belts while driving.   Follow-up Information    Follow up with Arvilla Meres, MD on 07/17/2011. (at 10:00a [employer] Code 6000))    Contact information:   [address] [place] [address] [place] [address] [phone]       Follow up with Primary MD. Schedule an appointment as soon as possible for a visit in 1 week.         Discharge Medications   Medication List  As of 07/09/2011 11:24 AM   START taking these medications         carvedilol 3.125 MG tablet   Commonly known as: COREG   Take 1 tablet (3.125 mg total) by mouth 2 (two)  times daily with a meal.      digoxin 0.125 MG tablet   Commonly known as: LANOXIN   Take 1 tablet (0.125 mg total) by mouth daily.         CONTINUE taking these medications         aspirin 81 MG chewable tablet   Chew 1 tablet (81 mg total) by mouth daily.      furosemide 40 MG tablet   Commonly known as: LASIX   Take 1 tablet (40 mg total) by mouth daily.      lisinopril 5 MG tablet  Commonly known as: PRINIVIL,ZESTRIL   Take 1 tablet (5 mg total) by mouth daily.      simvastatin 40 MG tablet   Commonly known as: ZOCOR          Where to get your medications    These are the prescriptions that you need to pick up. We sent them to a specific pharmacy, so you will need to go there to get them.   [facility] [address] [place], [place] - [address]    [address] [place]  [address]    Phone: [phone]        carvedilol 3.125 MG tablet   digoxin 0.125 MG tablet             Total Time in preparing paper work, data evaluation and todays exam - 35 minutes  Leroy Sea M.D on 07/09/2011 at 11:24 AM  [facility] Office  [phone]

## 2011-07-09 NOTE — Progress Notes (Signed)
DC IV, DC Tele, DC home. Discharge instructions and home medications discussed with patient. Patient denies any questions or concerns at this time. Patient leaving unit via wheelchair and appears in no acute distress.

## 2011-07-09 NOTE — Consult Note (Signed)
Pt  States he's cut down from 2 ppd to 1/2 ppd.  Congratulated pt and encouraged to quit. Pt says he plans to keep cutting down on his own the way he's been already. Pt in action stage. He also states he's not doing any other drugs anymore. Referred to 1-800 quit now for f/u and support. Discussed oral fixation substitutes, second hand smoke and in home smoking policy. Reviewed and gave pt Written education/contact information.

## 2011-07-17 ENCOUNTER — Encounter (HOSPITAL_COMMUNITY): Payer: Medicaid Other

## 2011-08-07 ENCOUNTER — Encounter (HOSPITAL_COMMUNITY): Payer: Self-pay

## 2011-08-07 ENCOUNTER — Emergency Department (HOSPITAL_COMMUNITY): Payer: Self-pay

## 2011-08-07 ENCOUNTER — Other Ambulatory Visit: Payer: Self-pay

## 2011-08-07 ENCOUNTER — Emergency Department (HOSPITAL_COMMUNITY)
Admission: EM | Admit: 2011-08-07 | Discharge: 2011-08-07 | Disposition: A | Discharge status: Home / Self Care | Payer: Self-pay | Attending: Emergency Medicine | Admitting: Emergency Medicine

## 2011-08-07 DIAGNOSIS — J449 Chronic obstructive pulmonary disease, unspecified: Secondary | ICD-10-CM | POA: Insufficient documentation

## 2011-08-07 DIAGNOSIS — R062 Wheezing: Secondary | ICD-10-CM | POA: Insufficient documentation

## 2011-08-07 DIAGNOSIS — I10 Essential (primary) hypertension: Secondary | ICD-10-CM | POA: Insufficient documentation

## 2011-08-07 DIAGNOSIS — I509 Heart failure, unspecified: Secondary | ICD-10-CM | POA: Insufficient documentation

## 2011-08-07 DIAGNOSIS — I2510 Atherosclerotic heart disease of native coronary artery without angina pectoris: Secondary | ICD-10-CM | POA: Insufficient documentation

## 2011-08-07 DIAGNOSIS — R0602 Shortness of breath: Secondary | ICD-10-CM | POA: Insufficient documentation

## 2011-08-07 DIAGNOSIS — J4489 Other specified chronic obstructive pulmonary disease: Secondary | ICD-10-CM | POA: Insufficient documentation

## 2011-08-07 DIAGNOSIS — R05 Cough: Secondary | ICD-10-CM | POA: Insufficient documentation

## 2011-08-07 DIAGNOSIS — R059 Cough, unspecified: Secondary | ICD-10-CM | POA: Insufficient documentation

## 2011-08-07 LAB — BASIC METABOLIC PANEL
BUN: 17 mg/dL (ref 6–23)
CO2: 28 mEq/L (ref 19–32)
Calcium: 9.4 mg/dL (ref 8.4–10.5)
GFR calc non Af Amer: 88 mL/min — ABNORMAL LOW (ref >90)
Glucose, Bld: 94 mg/dL (ref 70–99)
Potassium: 3.8 mEq/L (ref 3.5–5.1)
Sodium: 142 mEq/L (ref 135–145)

## 2011-08-07 LAB — DIFFERENTIAL
Eosinophils Absolute: 0.1 10*3/uL (ref 0.0–0.7)
Eosinophils Relative: 1 % (ref 0–5)
Lymphocytes Relative: 32 % (ref 12–46)
Lymphs Abs: 2.2 10*3/uL (ref 0.7–4.0)
Monocytes Relative: 10 % (ref 3–12)

## 2011-08-07 LAB — CBC
Hemoglobin: 14.8 g/dL (ref 13.0–17.0)
MCH: 28 pg (ref 26.0–34.0)
MCV: 83.9 fL (ref 78.0–100.0)
RBC: 5.28 MIL/uL (ref 4.22–5.81)
WBC: 6.8 10*3/uL (ref 4.0–10.5)

## 2011-08-07 LAB — PRO B NATRIURETIC PEPTIDE: Pro B Natriuretic peptide (BNP): 2115 pg/mL — ABNORMAL HIGH (ref 0–125)

## 2011-08-07 LAB — DIGOXIN LEVEL: Digoxin Level: <0.3 ng/mL — ABNORMAL LOW (ref 0.8–2.0)

## 2011-08-07 MED ORDER — ALBUTEROL SULFATE 2 MG PO TABS: 2.0000 mg | ORAL_TABLET | Freq: Three times a day (TID) | ORAL | Status: DC

## 2011-08-07 MED ORDER — PREDNISONE 20 MG PO TABS: 60.0000 mg | ORAL_TABLET | Freq: Every day | ORAL | Status: DC

## 2011-08-07 MED ORDER — ALBUTEROL SULFATE HFA 108 (90 BASE) MCG/ACT IN AERS
2.0000 | INHALATION_SPRAY | Freq: Once | RESPIRATORY_TRACT | Status: AC
  Administered 2011-08-07: 2 via RESPIRATORY_TRACT
  Filled 2011-08-07: qty 6.7

## 2011-08-07 MED ORDER — FUROSEMIDE 40 MG PO TABS: 40.0000 mg | ORAL_TABLET | Freq: Every day | ORAL | Status: DC

## 2011-08-07 MED ORDER — FUROSEMIDE 10 MG/ML IJ SOLN
40.0000 mg | Freq: Once | INTRAMUSCULAR | Status: AC
  Administered 2011-08-07: 40 mg via INTRAVENOUS
  Filled 2011-08-07: qty 4

## 2011-08-07 MED ORDER — AEROCHAMBER PLUS W/MASK MISC
Status: AC
  Filled 2011-08-07: qty 1

## 2011-08-07 MED ORDER — PREDNISONE 20 MG PO TABS
60.0000 mg | ORAL_TABLET | Freq: Once | ORAL | Status: AC
  Administered 2011-08-07: 60 mg via ORAL
  Filled 2011-08-07: qty 3

## 2011-08-07 MED ORDER — ALBUTEROL (5 MG/ML) CONTINUOUS INHALATION SOLN
10.0000 mg/h | INHALATION_SOLUTION | RESPIRATORY_TRACT | Status: DC
  Administered 2011-08-07: 10 mg/h via RESPIRATORY_TRACT
  Filled 2011-08-07: qty 20

## 2011-08-07 NOTE — Discharge Instructions (Signed)
Chronic Obstructive Pulmonary Disease Chronic obstructive pulmonary disease (COPD) is a condition in which airflow from the lungs is restricted. The lungs can never return to normal, but there are measures you can take which will improve them and make you feel better. CAUSES   Smoking.   Exposure to secondhand smoke.   Breathing in irritants (pollution, cigarette smoke, strong smells, aerosol sprays, paint fumes).   History of lung infections.  TREATMENT  Treatment focuses on making you comfortable (supportive care). Your caregiver may prescribe medications (inhaled or pills) to help improve your breathing. HOME CARE INSTRUCTIONS   If you smoke, stop smoking.   Avoid exposure to smoke, chemicals, and fumes that aggravate your breathing.   Take antibiotic medicines as directed by your caregiver.   Avoid medicines that dry up your system and slow down the elimination of secretions (antihistamines and cough syrups). This decreases respiratory capacity and may lead to infections.   Drink enough water and fluids to keep your urine clear or pale yellow. This loosens secretions.   Use humidifiers at home and at your bedside if they do not make breathing difficult.   Receive all protective vaccines your caregiver suggests, especially pneumococcal and influenza.   Use home oxygen as suggested.   Stay active. Exercise and physical activity will help maintain your ability to do things you want to do.   Eat a healthy diet.  SEEK MEDICAL CARE IF:   You develop pus-like mucus (sputum).   Breathing is more labored or exercise becomes difficult to do.   You are running out of the medicine you take for your breathing.  SEEK IMMEDIATE MEDICAL CARE IF:   You have a rapid heart rate.   You have agitation, confusion, tremors, or are in a stupor (family members may need to observe this).   It becomes difficult to breathe.   You develop chest pain.   You have a fever.  MAKE SURE YOU:    Understand these instructions.   Will watch your condition.   Will get help right away if you are not doing well or get worse.  Document Released: 02/04/2005 Document Revised: 04/16/2011 Document Reviewed: 06/27/2010 [facility] Patient Information 2012 [place], [place].  Heart Failure Heart failure (HF) is a condition in which the heart has trouble pumping blood. This means your heart does not pump blood efficiently for your body to work well. In some cases of HF, fluid may back up into your lungs or you may have swelling (edema) in your lower legs. HF is a long-term (chronic) condition. It is important for you to take good care of yourself and follow your caregiver's treatment plan. CAUSES   Health conditions:   High blood pressure (hypertension) causes the heart muscle to work harder than normal. When pressure in the blood vessels is high, the heart needs to pump (contract) with more force in order to circulate blood throughout the body. High blood pressure eventually causes the heart to become stiff and weak.   Coronary artery disease (CAD) is the buildup of cholesterol and fat (plaques) in the arteries of the heart. The blockage in the arteries deprives the heart muscle of oxygen and blood. This can cause chest pain and may lead to a heart attack. High blood pressure can also contribute to CAD.   Heart attack (myocardial infarction) occurs when 1 or more arteries in the heart become blocked. The loss of oxygen damages the muscle tissue of the heart. When this happens, part of the heart  muscle dies. The injured tissue does not contract as well and weakens the heart's ability to pump blood.   Abnormal heart valves can cause HF when the heart valves do not open and close properly. This makes the heart muscle pump harder to keep the blood flowing.   Heart muscle disease (cardiomyopathy or myocarditis) is damage to the heart muscle from a variety of causes. These can include drug or alcohol  abuse, infections, or unknown reasons. These can increase the risk of HF.   Lung disease makes the heart work harder because the lungs do not work properly. This can cause a strain on the heart leading it to fail.   Diabetes increases the risk of HF. High blood sugar contributes to high fat (lipid) levels in the blood. Diabetes can also cause slow damage to tiny blood vessels that carry important nutrients to the heart muscle. When the heart does not get enough oxygen and food, it can cause the heart to become weak and stiff. This leads to a heart that does not contract efficiently.   Other diseases can contribute to HF. These include abnormal heart rhythms, thyroid problems, and low blood counts (anemia).   Unhealthy lifestyle habits:   Obesity.   Smoking.   Eating foods high in fat and cholesterol.   Eating or drinking beverages high in salt.   Drug or alcohol abuse.   Lack of exercise.  SYMPTOMS  HF symptoms may vary and can be hard to detect. Symptoms may include:  Shortness of breath with activity, such as climbing stairs.   Persistent cough.   Swelling of the feet, ankles, legs, or abdomen.   Unexplained weight gain.   Difficulty breathing when lying flat.   Waking from sleep because of the need to sit up and get more air.   Rapid heartbeat.   Fatigue and loss of energy.   Feeling lightheaded or close to fainting.  DIAGNOSIS  A diagnosis of HF is based on your history, symptoms, physical examination, and diagnostic tests. Diagnostic tests for HF may include:  EKG.   Chest X-ray.   Blood tests.   Exercise stress test.   Blood oxygen test (arterial blood gas).   Evaluation by a heart doctor (cardiologist).   Ultrasound evaluation of the heart (echocardiogram).   Heart artery test to look for blockages (angiogram).   Radioactive imaging to look at the heart (radionuclide test).  TREATMENT  Treatment is aimed at managing the symptoms of HF. Medicines,  lifestyle changes, or surgical intervention may be necessary to treat HF.  Medicines to help treat HF may include:   Angiotensin-converting enzyme (ACE) inhibitors. These block the effects of a blood protein called angiotensin-converting enzyme. ACE inhibitors relax (dilate) the blood vessels and help lower blood pressure. This decreases the workload of the heart, slows the progression of HF, and improves symptoms.   Angiotensin receptor blockers (ARBs). These medications work similar to ACE inhibitors. ARBs may be an alternative for people who cannot tolerate an ACE inhibitor.   Aldosterone antagonists. This medication helps get rid of extra fluid from your body. This lowers the volume of blood the heart has to pump.   Water pills (diuretics). Diuretics cause the kidneys to remove salt and water from the blood. The extra fluid is removed by urination. By removing extra fluid from the body, diuretics help lower the workload of the heart and help prevent fluid buildup in the lungs so breathing is easier.   Beta blockers. These prevent the  heart from beating too fast and improve heart muscle strength. Beta blockers help maintain a normal heart rate, control blood pressure, and improve HF symptoms.   Digitalis. This increases the force of the heartbeat and may be helpful to people with HF or heart rhythm problems.   Healthy lifestyle changes include:   Stopping smoking.   Eating a healthy diet. Avoid foods high in fat. Avoid foods fried in oil or made with fat. A dietician can help with healthy food choices.   Limiting how much salt you eat.   Limiting alcohol intake to no more than 1 drink per day for women and 2 drinks per day for men. Drinking more than that is harmful to your heart. If your heart has already been damaged by alcohol or you have severe HF, drinking alcohol should be stopped completely.   Exercising as directed by your caregiver.   Surgical treatment for HF may include:    Procedures to open blocked arteries, repair damaged heart valves, or remove damaged heart muscle tissue.   A pacemaker to help heart muscle function and to control certain abnormal heart rhythms.   A defibrillator to possibly prevent sudden cardiac death.  HOME CARE INSTRUCTIONS   Activity level. Your caregiver can help you determine what type of exercise program may be helpful. It is important to maintain your strength. Pace your physical activity to avoid shortness of breath or chest pain. Rest for 1 hour before and after meals. A cardiac rehabilitation program may be helpful to some people with HF.   Diet. Eat a heart healthy diet. Food choices should be low in saturated fat and cholesterol. Talk to a dietician to learn about heart healthy foods.   Salt intake. When you have HF, you need to limit the amount of salt you eat. Eat less than 1500 milligrams (mg) of salt per day or as recommended by your caregiver.   Weight monitoring. Weigh yourself every day. You should weigh yourself in the morning after you urinate and before you eat breakfast. Wear the same amount of clothing each time you weigh yourself. Record your weight daily. Bring your recorded weights to your clinic visits. Tell your caregiver right away if you have gained 3 lb/1.4 kg in 1 day, or 5 lb/2.3 kg in a week or whatever amount you were told to report.   Blood pressure monitoring. This should be done as directed by your caregiver. A home blood pressure cuff can be purchased at a drugstore. Record your blood pressure numbers and bring them to your clinic visits. Tell your caregiver if you become dizzy or lightheaded upon standing up.   Smoking. If you are currently a smoker, it is time to quit. Nicotine makes your heart work harder by causing your blood vessels to constrict. Do not use nicotine gum or patches before talking to your caregiver.   Follow up. Be sure to schedule a follow-up visit with your caregiver. Keep all  your appointments.  SEEK MEDICAL CARE IF:   Your weight increases by 3 lb/1.4 kg in 1 day or 5 lb/2.3 kg in a week.   You notice increasing shortness of breath that is unusual for you. This may happen during rest, sleep, or with activity.   You cough more than normal, especially with physical activity.   You notice more swelling in your hands, feet, ankles, or belly (abdomen).   You are unable to sleep because it is hard to breathe.   You cough up bloody  mucus (sputum).   You begin to feel "jumping" or "fluttering" sensations (palpitations) in your chest.  SEEK IMMEDIATE MEDICAL CARE IF:   You have severe chest pain or pressure which may include symptoms such as:   Pain or pressure in the arms, neck, jaw, or back.   Feeling sweaty.   Feeling sick to your stomach (nauseous).   Feeling short of breath while at rest.   Having a fast or irregular heartbeat.   You experience stroke symptoms. These symptoms include:   Facial weakness or numbness.   Weakness or numbness in an arm, leg, or on one side of your body.   Blurred vision.   Difficulty talking or thinking.   Dizziness or fainting.   Severe headache.  THESE ARE MEDICAL EMERGENCIES. Do not wait to see if the symptoms go away. Call your local emergency services (911 in [place] DO NOT drive yourself to the hospital. IMPORTANT  Make a list of every medicine, vitamin, or herbal supplement you are taking. Keep the list with you at all times. Show it to your caregiver at every visit. Keep the list up-to-date.   Ask your caregiver or pharmacist to write an explanation of each medicine you are taking. This should include:   Why you are taking it.   The possible side effects.   The best time of day to take it.   Foods to take with it or what foods to avoid.   When to stop taking it.  MAKE SURE YOU:   Understand these instructions.   Will watch your condition.   Will get help right away if you are not doing well  or get worse.  Document Released: 04/27/2005 Document Revised: 04/16/2011 Document Reviewed: 08/09/2009 [facility] Patient Information 2012 [place], [place]. RESOURCE GUIDE  Dental Problems  Patients with Medicaid: [facility] [phone] [address]                                           [phone] [address] Phone:  [phone]                                                   Phone:  [phone]  If unable to pay or uninsured, contact:  [facility] or [facility] to become qualified for the adult dental clinic.  Chronic Pain Problems Contact [facility]  [phone] Patients need to be referred by their primary care doctor.  Insufficient Money for Medicine Contact [employer]:  call "211" or [facility] [phone].  No Primary Care Doctor Call [facility]  [phone] Other agencies that provide inexpensive medical care    [facility]  [phone]    [facility]  [phone]    [facility]  [phone]    [facility]  [phone]    [facility]  [phone]    [facility]  [phone]  Psychological Services [facility] Behavioral Health  [phone] [facility] Services  [phone] [facility]   [phone] (emergency services [phone])  Abuse/Neglect [facility] Child Abuse Hotline (  212-724-0357 [facility] Child Abuse Hotline 949-540-3107 (After Hours)  Emergency [facility] [phone]  [facility] 2600614806 [place] Alert Services ([phone]  MRSA Hotline #:   (480) 844-1027    [facility] Resources  [facility] [place]  [employer]                           [facility]. [address] [place]                     [address]         [address]  Blondell Reveal Phone:  [phone]                                  Phone:  [phone]                   Phone:  [phone]  [facility] Mental Health Phone:  [phone]  [facility] Child Abuse Hotline 204-146-4625 [phone] (After Hours)

## 2011-08-07 NOTE — ED Notes (Signed)
Pt lives at [facility] and states that since earlier this morning he has been having a severe cough with shortness of breath. Pt received 5mg  albuterol en route with some improvement in shortness of breath but has not started to cough up some yellowish sputum. Uncertain of fevers at home. Alert and oriented.

## 2011-08-07 NOTE — ED Provider Notes (Signed)
History     CSN: [id number]  Arrival date & time 08/07/11  8469   First MD Initiated Contact with Patient 08/07/11 [phone]      Chief Complaint  Patient presents with  . Shortness of Breath  . 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 She complaining of 2-3 days of worsening shortness of breath from baseline. This includes exertional dyspnea. Able 3 pillow orthopnea. She has been waking up in the middle and I feel short of breath. He complains of new cough with white phlegm production. Denies fevers, chills. He states this feels like his heart failure. He does have left-sided sharp chest pain without radiation. He denies nausea, vomiting, diaphoresis. The chest pain isn't present since awakening this morning it has been constant. Denies abdominal pain. Denies h/o VTE in self or family. No recent hosp/surg/immob. No h/o cancer. Denies exogenous hormone use, no leg pain or swelling  Pt states no longer takes albuterol. He experienced some relief of sx with nebulizer treatment given en route by EMS  H/o substance abuse, denies recent cocaine  Past Medical History  Diagnosis Date  . Hypertension   . Chronic systolic heart failure   . Substance abuse   . CAD (coronary artery disease)     nonobstructive    Past Surgical History  Procedure Date  . Joint replacement L knee    Family History  Problem Relation Age of Onset  . Malignant hyperthermia Mother     History  Substance Use Topics  . Smoking status: Current Everyday Smoker -- 0.2 packs/day for 30 years    Types: Cigarettes  . Smokeless tobacco: Never Used  . Alcohol Use: No    Review of Systems  All other systems reviewed and are negative.  except as noted HPI   Allergies  Review of patient's  allergies indicates no known allergies.  Home Medications   Current Outpatient Rx  Name Route Sig Dispense Refill  . ASPIRIN 81 MG PO CHEW Oral Chew 1 tablet (81 mg total) by mouth daily. 30 tablet 0  . CARVEDILOL 3.125 MG PO TABS Oral Take 1 tablet (3.125 mg total) by mouth 2 (two) times daily with a meal. 60 tablet 0  . DIGOXIN 0.125 MG PO TABS Oral Take 1 tablet (0.125 mg total) by mouth daily. 30 tablet 0  . FUROSEMIDE 40 MG PO TABS Oral Take 1 tablet (40 mg total) by mouth daily. 30 tablet 0  . LISINOPRIL 5 MG PO TABS Oral Take 1 tablet (5 mg total) by mouth daily. 30 tablet 0  . SIMVASTATIN 40 MG PO TABS Oral Take 40 mg by mouth at bedtime.      . ALBUTEROL SULFATE 2 MG PO TABS Oral Take 1 tablet (2 mg total) by mouth 3 (three) times daily. 20 tablet 3  . FUROSEMIDE 40 MG PO TABS Oral Take 1 tablet (40 mg total) by mouth daily. 30 tablet 0  . PREDNISONE 20 MG PO TABS Oral Take 3 tablets (60 mg total) by mouth daily. 15 tablet 0    BP 122/87  Pulse 80  Temp(Src) 97.8 F (36.6 C) (Oral)  Resp 16  SpO2 95%  Physical Exam  Nursing note and vitals reviewed. Constitutional: He is oriented to person, place, and time. He appears well-developed and well-nourished.  No distress.  HENT:  Head: Atraumatic.  Mouth/Throat: Oropharynx is clear and moist.  Eyes: Conjunctivae are normal. Pupils are equal, round, and reactive to light.  Neck: Neck supple.  Cardiovascular: Normal rate, regular rhythm, normal heart sounds and intact distal pulses.  Exam reveals no gallop and no friction rub.   No murmur heard. Pulmonary/Chest: Effort normal. No respiratory distress. He has wheezes. He has no rales.       Diffuse exp wheeze Bibasilar crackles  Abdominal: Soft. Bowel sounds are normal. There is no tenderness. There is no rebound and no guarding.  Musculoskeletal: Normal range of motion. He exhibits no edema and no tenderness.  Neurological: He is alert and oriented to person, place, and time.    Skin: Skin is warm and dry.  Psychiatric: He has a normal mood and affect.    Date: 08/07/2011  Rate: 81  Rhythm: normal sinus rhythm  QRS Axis: normal  Intervals: nl  ST/T Wave abnormalities: nonspecific T wave changes  Conduction Disutrbances:none  Narrative Interpretation:   Old EKG Reviewed: no significant change  ED Course  Procedures (including critical care time)  Labs Reviewed  BASIC METABOLIC PANEL - Abnormal; Notable for the following:    GFR calc non Af Amer 88 (*)    All other components within normal limits  PRO B NATRIURETIC PEPTIDE - Abnormal; Notable for the following:    Pro B Natriuretic peptide (BNP) 2115.0 (*)    All other components within normal limits  DIGOXIN LEVEL - Abnormal; Notable for the following:    Digoxin Level <0.3 (*)    All other components within normal limits  CBC  DIFFERENTIAL  TROPONIN I  TROPONIN I   Dg Chest 2 View  08/07/2011  *RADIOLOGY REPORT*  Clinical Data: Cough with shortness of breath for 2 days.  CHEST - 2 VIEW  Comparison: 07/08/2011 and 07/07/2011.  Findings: The heart size and mediastinal contours are stable. There is mild pulmonary edema, slightly worsened compared with the most recent examination.  No significant recurrent pleural effusion is identified.  There is underlying bullous change in both apices. The osseous structures appear unchanged.  IMPRESSION: Recurrent mild congestive heart failure with edema superimposed on chronic lung disease.  Original Report Authenticated By: Gerrianne Scale, M.D.     1. COPD (chronic obstructive pulmonary disease)   2. Congestive heart failure     MDM  Likely mixed etiology of SOB- COPD and CHF. Given duoneb en route and albuterol in ED. 40mg  Lasix IV given in ED with UOP >1.5L. Pt feeling improved. No longer having chest pain. Wheezing resolved. Eating without difficulty. D/W Cardiology. Will refill lasix. Home with prednisone and albuterol tab, inh given in ED. As pt is  homeless, Cards established appt for him in heart failure clinic 2 days at 0830. Pt given precautions for return. Comfortable with discharge home with cardiology and pMD f/u.        Forbes Cellar, MD 08/07/11 1114

## 2011-08-09 ENCOUNTER — Emergency Department (HOSPITAL_COMMUNITY)
Admission: EM | Admit: 2011-08-09 | Discharge: 2011-08-10 | Disposition: A | Discharge status: Home / Self Care | Payer: Self-pay | Attending: Emergency Medicine | Admitting: Emergency Medicine

## 2011-08-09 ENCOUNTER — Other Ambulatory Visit: Payer: Self-pay

## 2011-08-09 ENCOUNTER — Emergency Department (HOSPITAL_COMMUNITY): Payer: Self-pay

## 2011-08-09 DIAGNOSIS — J4489 Other specified chronic obstructive pulmonary disease: Secondary | ICD-10-CM | POA: Insufficient documentation

## 2011-08-09 DIAGNOSIS — F17200 Nicotine dependence, unspecified, uncomplicated: Secondary | ICD-10-CM | POA: Insufficient documentation

## 2011-08-09 DIAGNOSIS — I498 Other specified cardiac arrhythmias: Secondary | ICD-10-CM | POA: Insufficient documentation

## 2011-08-09 DIAGNOSIS — I10 Essential (primary) hypertension: Secondary | ICD-10-CM | POA: Insufficient documentation

## 2011-08-09 DIAGNOSIS — J449 Chronic obstructive pulmonary disease, unspecified: Secondary | ICD-10-CM

## 2011-08-09 DIAGNOSIS — R0602 Shortness of breath: Secondary | ICD-10-CM | POA: Insufficient documentation

## 2011-08-09 DIAGNOSIS — I509 Heart failure, unspecified: Secondary | ICD-10-CM | POA: Insufficient documentation

## 2011-08-09 DIAGNOSIS — I2510 Atherosclerotic heart disease of native coronary artery without angina pectoris: Secondary | ICD-10-CM | POA: Insufficient documentation

## 2011-08-09 DIAGNOSIS — R011 Cardiac murmur, unspecified: Secondary | ICD-10-CM | POA: Insufficient documentation

## 2011-08-09 DIAGNOSIS — I517 Cardiomegaly: Secondary | ICD-10-CM | POA: Insufficient documentation

## 2011-08-09 LAB — DIFFERENTIAL
Basophils Relative: 1 % (ref 0–1)
Eosinophils Absolute: 0.1 10*3/uL (ref 0.0–0.7)
Monocytes Absolute: 0.9 10*3/uL (ref 0.1–1.0)
Monocytes Relative: 11 % (ref 3–12)
Neutro Abs: 5.4 10*3/uL (ref 1.7–7.7)

## 2011-08-09 LAB — CBC
HCT: 47.6 % (ref 39.0–52.0)
Hemoglobin: 15.8 g/dL (ref 13.0–17.0)
MCH: 28 pg (ref 26.0–34.0)
MCHC: 33.2 g/dL (ref 30.0–36.0)

## 2011-08-09 LAB — BASIC METABOLIC PANEL
BUN: 14 mg/dL (ref 6–23)
Chloride: 102 mEq/L (ref 96–112)
Creatinine, Ser: 0.96 mg/dL (ref 0.50–1.35)
GFR calc Af Amer: >90 mL/min (ref >90)

## 2011-08-09 MED ORDER — SODIUM CHLORIDE 0.9 % IV SOLN
INTRAVENOUS | Status: DC
Start: 2011-08-09 — End: 2011-08-10

## 2011-08-09 MED ORDER — NITROGLYCERIN 0.4 MG SL SUBL
SUBLINGUAL_TABLET | SUBLINGUAL | Status: AC
  Administered 2011-08-09: 19:00:00
  Filled 2011-08-09: qty 75

## 2011-08-09 MED ORDER — FUROSEMIDE 40 MG PO TABS: 40.0000 mg | ORAL_TABLET | Freq: Every day | ORAL | Status: DC

## 2011-08-09 MED ORDER — PREDNISONE 20 MG PO TABS: 60.0000 mg | ORAL_TABLET | Freq: Every day | ORAL | Status: AC

## 2011-08-09 MED ORDER — FUROSEMIDE 10 MG/ML IJ SOLN
INTRAMUSCULAR | Status: AC
  Filled 2011-08-09: qty 4

## 2011-08-09 MED ORDER — HYDROCOD POLST-CHLORPHEN POLST 10-8 MG/5ML PO LQCR
5.0000 mL | Freq: Once | ORAL | Status: AC
  Administered 2011-08-10: 5 mL via ORAL
  Filled 2011-08-09: qty 5

## 2011-08-09 MED ORDER — LISINOPRIL 20 MG PO TABS: 10.0000 mg | ORAL_TABLET | Freq: Every day | ORAL | Status: DC

## 2011-08-09 MED ORDER — FUROSEMIDE 10 MG/ML IJ SOLN
INTRAMUSCULAR | Status: AC
  Administered 2011-08-09: 50 mg
  Filled 2011-08-09: qty 2

## 2011-08-09 MED ORDER — ALBUTEROL SULFATE HFA 108 (90 BASE) MCG/ACT IN AERS: 1.0000 | INHALATION_SPRAY | Freq: Four times a day (QID) | RESPIRATORY_TRACT | Status: DC | PRN

## 2011-08-09 MED ORDER — FUROSEMIDE 10 MG/ML IJ SOLN
40.0000 mg | Freq: Once | INTRAMUSCULAR | Status: DC
  Filled 2011-08-09: qty 4

## 2011-08-09 NOTE — ED Notes (Signed)
Per MD, place patient on room air.

## 2011-08-09 NOTE — ED Notes (Signed)
Per MD, hold NS 125 mL/hr IV until the chest x-ray comes back.

## 2011-08-09 NOTE — ED Notes (Signed)
Spoke with social services.  The representative said medication assistance is handled by Blanche East at [employer].  She can be reached at [phone].

## 2011-08-09 NOTE — ED Notes (Signed)
The patient states that he started feeling short of breath at 1200 today.  Per EMS, the patient ran out of his meds on Friday and has not taken them since then.

## 2011-08-09 NOTE — ED Notes (Signed)
The patient states our social worker can reach him on his cell phone at [phone].

## 2011-08-09 NOTE — ED Notes (Signed)
Patient was given nitro-stat 0.4 mg, sL x 3 (NOT x1 as listed in override)

## 2011-08-09 NOTE — ED Notes (Signed)
Patient was give Lasix 50 mg, iv and nitro-stat 0.4 mg, sL x 3 by EMS.

## 2011-08-09 NOTE — ED Provider Notes (Signed)
History     CSN: [id number]  Arrival date & time 08/09/11  7564   First MD Initiated Contact with Patient 08/09/11 2013      Chief Complaint  Patient presents with  . Shortness of Breath    (Consider location/radiation/quality/duration/timing/severity/associated sxs/prior treatment) Patient is a 61 y.o. male presenting with shortness of breath. The history is provided by the patient and medical records.  Shortness of Breath  Associated symptoms include chest pain, cough and shortness of breath. Pertinent negatives include no fever.   the patient is a 61 year old male, with a history of congestive heart failure, hypertension, who presents to emergency department complaining of progressive shortness of breath, and chest pain for several days.  He states if there was a day or with his Medicaid and now.  He is unable to get his medications.  Therefore, he has not had his Lasix or any other medication for several days.  He denies nausea, vomiting, fevers, chills.  He does not have any sweating.  He says when he coughs.  His sputum is clear.  He denies leg pain or swelling. He continues to smoke cigarettes.  He does not have to use oxygen at home  Past Medical History  Diagnosis Date  . Hypertension   . Chronic systolic heart failure   . Substance abuse   . CAD (coronary artery disease)     nonobstructive    Past Surgical History  Procedure Date  . Joint replacement L knee    Family History  Problem Relation Age of Onset  . Malignant hyperthermia Mother     History  Substance Use Topics  . Smoking status: Current Everyday Smoker -- 0.2 packs/day for 30 years    Types: Cigarettes  . Smokeless tobacco: Never Used  . Alcohol Use: No      Review of Systems  Constitutional: Negative for fever and chills.  HENT: Negative for congestion.   Respiratory: Positive for cough and shortness of breath.   Cardiovascular: Positive for chest pain. Negative for palpitations and leg  swelling.  Gastrointestinal: Negative for nausea, vomiting and abdominal pain.  Neurological: Negative for headaches.  Psychiatric/Behavioral: Negative for confusion.  All other systems reviewed and are negative.    Allergies  Review of patient's allergies indicates no known allergies.  Home Medications   Current Outpatient Rx  Name Route Sig Dispense Refill  . ALBUTEROL SULFATE 2 MG PO TABS Oral Take 2 mg by mouth 3 (three) times daily.    . ASPIRIN 81 MG PO CHEW Oral Chew 1 tablet (81 mg total) by mouth daily. 30 tablet 0  . CARVEDILOL 3.125 MG PO TABS Oral Take 3.125 mg by mouth 2 (two) times daily with a meal. Time taken unknown    . DIGOXIN 0.125 MG PO TABS Oral Take 1 tablet (0.125 mg total) by mouth daily. 30 tablet 0  . FUROSEMIDE 40 MG PO TABS Oral Take 1 tablet (40 mg total) by mouth daily. 30 tablet 0  . LISINOPRIL 5 MG PO TABS Oral Take 1 tablet (5 mg total) by mouth daily. 30 tablet 0  . SIMVASTATIN 40 MG PO TABS Oral Take 40 mg by mouth at bedtime.        BP 160/117  Pulse 108  Temp(Src) 97.6 F (36.4 C) (Oral)  Resp 26  Ht 6' 1.5" (1.867 m)  Wt 140 lb (63.504 kg)  BMI 18.22 kg/m2  SpO2 96%  Physical Exam  Vitals reviewed. Constitutional: He is oriented to person,  place, and time. He appears well-developed and well-nourished.       Anxious  HENT:  Head: Normocephalic and atraumatic.  Eyes: Conjunctivae are normal. Pupils are equal, round, and reactive to light.  Neck: Normal range of motion. Neck supple.  Cardiovascular:  Murmur heard.      Tachycardia  Pulmonary/Chest: He is in respiratory distress. He has no wheezes. He has no rales.       Tachypnea with accessory muscle use  Abdominal: Soft. He exhibits no distension.  Musculoskeletal: Normal range of motion. He exhibits no edema and no tenderness.  Neurological: He is alert and oriented to person, place, and time.  Skin: Skin is warm and dry.  Psychiatric: Judgment and thought content normal.        Anxious    ED Course  Procedures (including critical care time) 61 year old male, with known congestive heart failure, presents with increasing shortness of breath or cough with clear sputum for several days.  He has run out of his maintenance medications.  I suspect that he is in pulmonary edema.  We will give him Lasix.  Check a chest x-ray, and laboratory testing, and EKG for evaluation.  Labs Reviewed  BASIC METABOLIC PANEL - Abnormal; Notable for the following:    GFR calc non Af Amer 90 (*)    All other components within normal limits  CBC  DIFFERENTIAL  POCT I-STAT TROPONIN I  PRO B NATRIURETIC PEPTIDE   Dg Chest Port 1 View  08/09/2011  *RADIOLOGY REPORT*  Clinical Data: Cough and shortness of breath.  PORTABLE CHEST - 1 VIEW  Comparison: 08/07/2011  Findings: Cardiac enlargement with increased pulmonary vascularity. Interstitial infiltrates suggesting interstitial edema.  There is also underlying fibrosis with scarring and bullous emphysematous change in the upper lungs.  No focal airspace consolidation.  No blunting of costophrenic angles.  No pneumothorax.  Allowing for differences in technique, the appearance is stable since the previous study.  IMPRESSION: Cardiac enlargement with pulmonary vascular congestion and interstitial edema.  Chronic fibrosis and bullous emphysematous changes.  No focal consolidation.  Original Report Authenticated By: Marlon Pel, M.D.     No diagnosis found.  ED ECG REPORT   Date: 08/09/2011  EKG Time: 10:10 PM  Rate: 107  Rhythm: sinus tachycardia,    Axis: nl  Intervals:none  ST&T Change: nonspecific  Narrative Interpretation: st with lvh and nonspecific tw changes            MDM  Dyspnea with mild chf and copd        Cheri Guppy, MD 08/09/11 2349

## 2011-08-09 NOTE — ED Notes (Addendum)
EKG DONE BY EMT R Jaelin Fackler OLD AND NEW EKG GIVEN TO DR Weldon Inches

## 2011-08-09 NOTE — Discharge Instructions (Signed)
Your chest x-ray does not show signs of pneumonia.  You also did not have a large amount of fluid in your lungs.  You have not had a heart attack.  Stop smoking.  Use albuterol and prednisone for COPD, and lisinopril for your blood pressure.  Use Lasix to prevent fluid accumulation in your lungs.  Follow up with your Dr. for reevaluation.  Return for worse or uncontrolled symptoms

## 2011-08-09 NOTE — ED Notes (Signed)
Secretary paged social services.

## 2011-08-09 NOTE — ED Notes (Signed)
Left message for [name] with patient's contact info.

## 2011-08-10 LAB — POCT I-STAT 3, ART BLOOD GAS (G3+)
Acid-Base Excess: 2 mmol/L (ref 0.0–2.0)
O2 Saturation: 92 %

## 2011-08-10 NOTE — ED Notes (Signed)
Respiratory advised ABG is needed.

## 2011-08-10 NOTE — ED Notes (Signed)
Patient is AOx4 and comfortable with his discharge instructions. 

## 2013-06-21 IMAGING — CR DG CHEST 2V
3 series · 3 of 3 positions shown · non-contrast
Comparison: 08/25/2010

CLINICAL DATA: Shortness breath.  Cough.  Follow-up pulmonary
edema.

CHEST - 2 VIEW

[w chest pa]
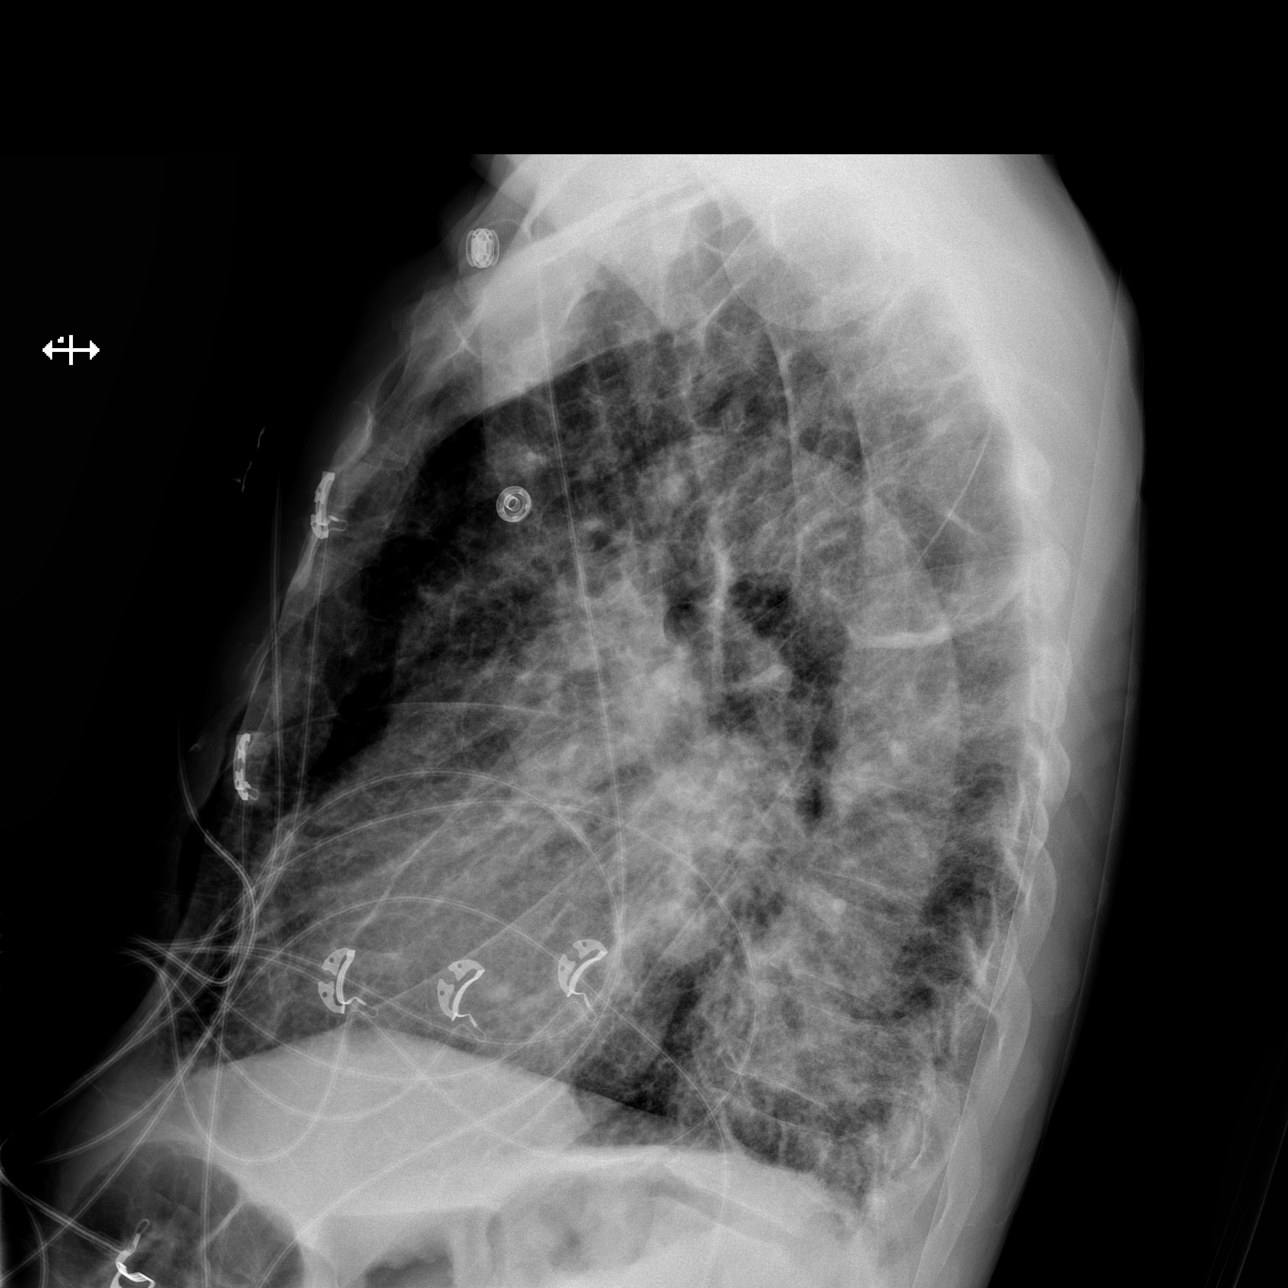

[x chest ap (1 of 2)]
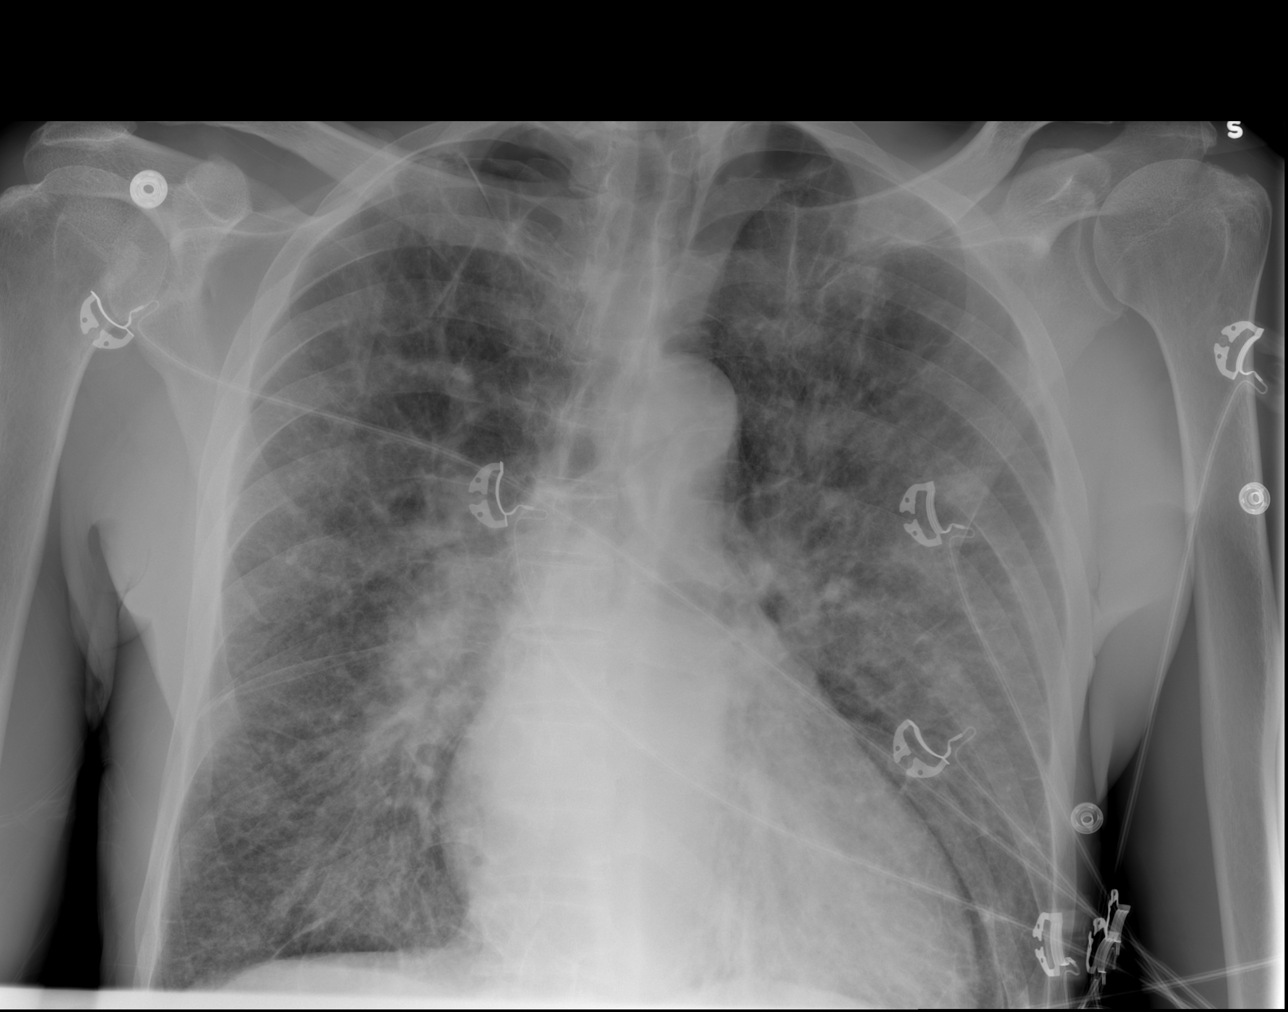

[x chest ap (2 of 2)]
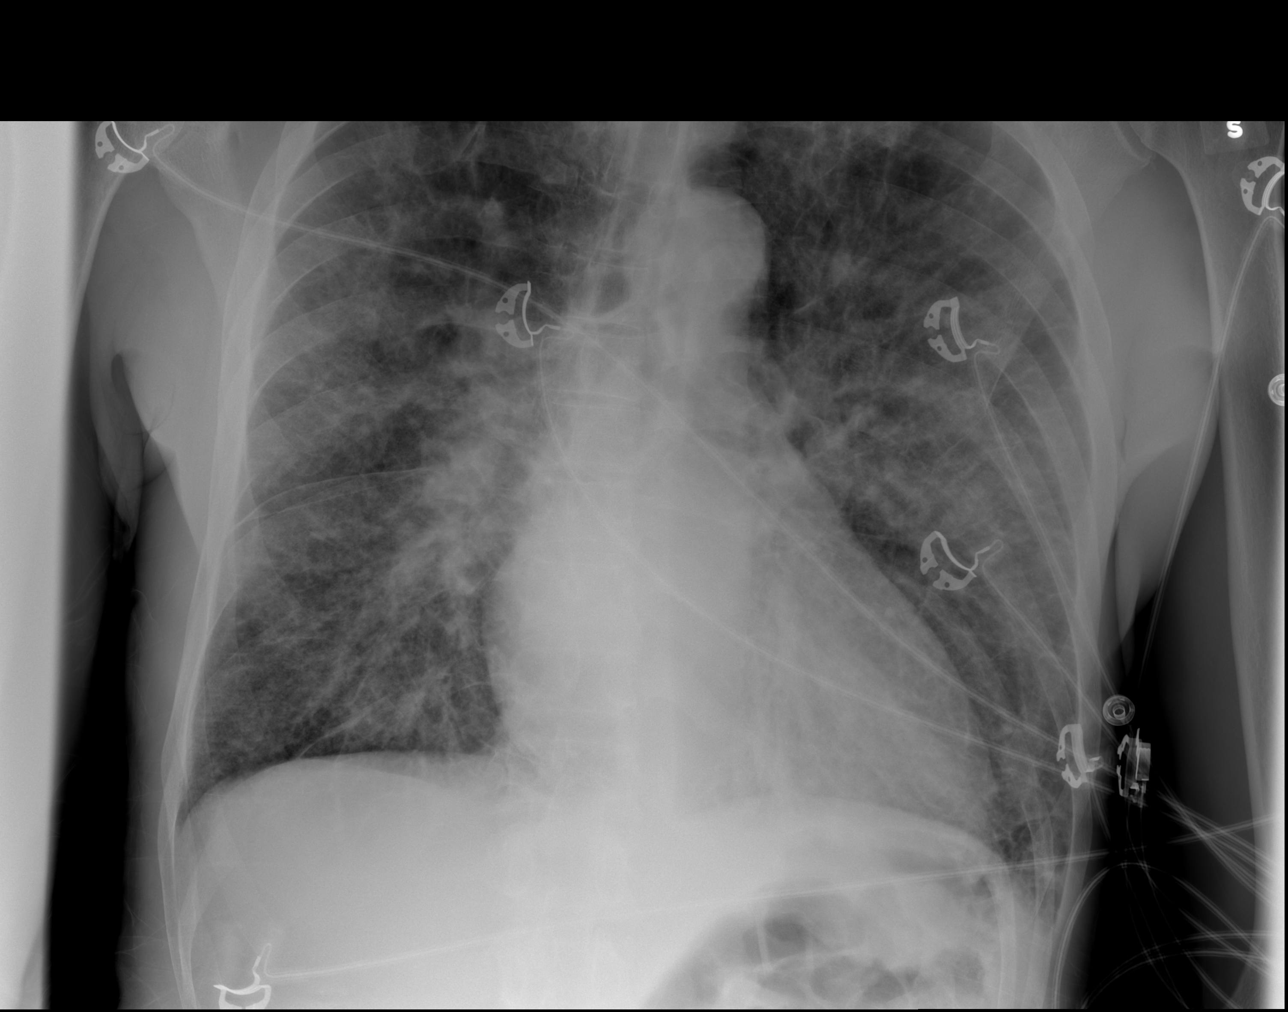

[3 of 3 positions shown; findings below may reference images not displayed]

FINDINGS: Moderate diffuse pulmonary interstitial edema is seen
which shows mild improvement since prior exam.  No evidence of
pulmonary consolidation or pleural effusion.  Mild cardiomegaly
stable.
IMPRESSION: Diffuse pulmonary interstitial edema, with mild improvement
compared to prior exam.

## 2015-12-03 DIAGNOSIS — Z5900 Homelessness unspecified: Secondary | ICD-10-CM

## 2015-12-04 NOTE — Congregational Nurse Program (Signed)
Congregational Nurse Program Note  Date of Encounter: 12/03/2015  Past Medical History: Past Medical History:  Diagnosis Date  . CAD (coronary artery disease)    nonobstructive  . Chronic systolic heart failure   . Hypertension   . Substance abuse     Encounter Details:     CNP Questionnaire - 12/04/15 1019      Patient Demographics   Is this a new or existing patient? New   Patient is considered a/an Not Applicable   Race African-American/Black     Patient Assistance   Location of Patient Assistance Not Applicable   Patient's financial/insurance status Low Income;Medicare   Uninsured Patient No   Patient referred to apply for the following financial assistance Not Applicable   Food insecurities addressed Provided food supplies   Transportation assistance No   Assistance securing medications No   Educational health offerings Chronic disease;Navigating the healthcare system     Encounter Details   Primary purpose of visit Chronic Illness/Condition Visit;Navigating the Healthcare System   Was an Emergency Department visit averted? Not Applicable   Does patient have a medical provider? No   Patient referred to Clinic   Was a mental health screening completed? (GAINS tool) No   Does patient have dental issues? No   Does patient have vision issues? No   Does your patient have an abnormal blood pressure today? No   Since previous encounter, have you referred patient for abnormal blood pressure that resulted in a new diagnosis or medication change? No   Does your patient have an abnormal blood glucose today? No   Since previous encounter, have you referred patient for abnormal blood glucose that resulted in a new diagnosis or medication change? No   Was there a life-saving intervention made? No      [facility] arrived at the shelter from [facility].  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  Is in need of a provider to monitor medication and blood  work.  Message left with [facility] for a call back to schedule an appointment

## 2016-01-04 ENCOUNTER — Emergency Department (HOSPITAL_COMMUNITY)
Admission: EM | Admit: 2016-01-04 | Discharge: 2016-01-05 | Disposition: A | Discharge status: Home / Self Care | Payer: Medicaid Other | Attending: Emergency Medicine | Admitting: Emergency Medicine

## 2016-01-04 ENCOUNTER — Emergency Department (HOSPITAL_COMMUNITY): Payer: Medicaid Other

## 2016-01-04 ENCOUNTER — Encounter (HOSPITAL_COMMUNITY): Payer: Self-pay | Admitting: *Deleted

## 2016-01-04 DIAGNOSIS — F17210 Nicotine dependence, cigarettes, uncomplicated: Secondary | ICD-10-CM | POA: Insufficient documentation

## 2016-01-04 DIAGNOSIS — I110 Hypertensive heart disease with heart failure: Secondary | ICD-10-CM | POA: Insufficient documentation

## 2016-01-04 DIAGNOSIS — Z96652 Presence of left artificial knee joint: Secondary | ICD-10-CM | POA: Insufficient documentation

## 2016-01-04 DIAGNOSIS — Z7901 Long term (current) use of anticoagulants: Secondary | ICD-10-CM | POA: Insufficient documentation

## 2016-01-04 DIAGNOSIS — I5022 Chronic systolic (congestive) heart failure: Secondary | ICD-10-CM | POA: Insufficient documentation

## 2016-01-04 DIAGNOSIS — I2510 Atherosclerotic heart disease of native coronary artery without angina pectoris: Secondary | ICD-10-CM | POA: Insufficient documentation

## 2016-01-04 DIAGNOSIS — R072 Precordial pain: Secondary | ICD-10-CM | POA: Insufficient documentation

## 2016-01-04 DIAGNOSIS — Z8673 Personal history of transient ischemic attack (TIA), and cerebral infarction without residual deficits: Secondary | ICD-10-CM | POA: Insufficient documentation

## 2016-01-04 HISTORY — DX: Cerebral infarction, unspecified: I63.9

## 2016-01-04 LAB — BASIC METABOLIC PANEL
ANION GAP: 8 (ref 5–15)
BUN: 17 mg/dL (ref 6–20)
CALCIUM: 9.3 mg/dL (ref 8.9–10.3)
CO2: 26 mmol/L (ref 22–32)
Chloride: 103 mmol/L (ref 101–111)
Creatinine, Ser: 1.2 mg/dL (ref 0.61–1.24)
GLUCOSE: 87 mg/dL (ref 65–99)
POTASSIUM: 4 mmol/L (ref 3.5–5.1)
Sodium: 137 mmol/L (ref 135–145)

## 2016-01-04 LAB — I-STAT TROPONIN, ED
TROPONIN I, POC: 0.02 ng/mL (ref 0.00–0.08)
Troponin i, poc: 0.01 ng/mL (ref 0.00–0.08)

## 2016-01-04 LAB — CBC
HEMATOCRIT: 45.6 % (ref 39.0–52.0)
HEMOGLOBIN: 15.2 g/dL (ref 13.0–17.0)
MCH: 29 pg (ref 26.0–34.0)
MCHC: 33.3 g/dL (ref 30.0–36.0)
MCV: 87 fL (ref 78.0–100.0)
Platelets: 173 10*3/uL (ref 150–400)
RBC: 5.24 MIL/uL (ref 4.22–5.81)
RDW: 15.4 % (ref 11.5–15.5)
WBC: 4.3 10*3/uL (ref 4.0–10.5)

## 2016-01-04 LAB — PROTIME-INR
INR: 1.95
PROTHROMBIN TIME: 22.5 s — AB (ref 11.4–15.2)

## 2016-01-04 LAB — BRAIN NATRIURETIC PEPTIDE: B Natriuretic Peptide: 311.2 pg/mL — ABNORMAL HIGH (ref 0.0–100.0)

## 2016-01-04 NOTE — ED Provider Notes (Signed)
[facility]-EMERGENCY DEPT Provider Note   CSN: [id number] Arrival date & time: 01/04/16  1949     History   Chief Complaint Chief Complaint  Patient presents with  . Abnormal ECG    HPI [name] is a 65 y.o. male.  HPI  Past Medical History:  Diagnosis Date  . CAD (coronary artery disease)    nonobstructive  . Chronic systolic heart failure (HCC)   . Hypertension   . Stroke (HCC)   . Substance abuse     Patient Active Problem List   Diagnosis Date Noted  . Acute on chronic systolic heart failure (HCC) 07/08/2011  . Hypokalemia 07/07/2011  . Flash pulmonary edema (HCC) 07/06/2011  . 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 Abnormal EKG 07/06/2011  . Chronic systolic congestive heart failure, NYHA class 3 (HCC) 05/05/2011  . Acute respiratory failure with hypoxia (HCC) 05/03/2011  . SUBSTANCE ABUSE, MULTIPLE 03/25/2010  . HYPERTENSION 03/25/2010    Past Surgical History:  Procedure Laterality Date  . JOINT REPLACEMENT  L knee       Home Medications    Prior to Admission medications   Medication Sig Start Date End Date Taking? Authorizing Provider  albuterol (PROVENTIL HFA;VENTOLIN HFA) 108 (90 BASE) MCG/ACT inhaler Inhale 1-2 puffs into the lungs every 6 (six) hours as needed for wheezing. 08/09/11 08/08/12  Cheri GuppyJeffrey Caporossi, MD  albuterol (PROVENTIL) 2 MG tablet Take 2 mg by mouth 3 (three) times daily. 08/07/11 08/06/12  Forbes CellarLeigh-Ann Webb, MD  carvedilol (COREG) 3.125 MG tablet Take 3.125 mg by mouth 2 (two) times daily with a meal. Time taken unknown 07/09/11 07/08/12  Leroy SeaPrashant K Singh, MD  digoxin (LANOXIN) 0.125 MG tablet Take 1 tablet (0.125 mg total) by mouth daily. 07/09/11 07/08/12  Leroy SeaPrashant K Singh, MD  furosemide (LASIX) 40 MG tablet Take 1 tablet (40 mg total) by mouth daily. 05/05/11 05/04/12  Belkys A Regalado, MD  furosemide (LASIX) 40 MG tablet Take 1 tablet (40 mg total) by mouth daily. 08/09/11 08/08/12  Cheri GuppyJeffrey Caporossi, MD  lisinopril  (PRINIVIL,ZESTRIL) 20 MG tablet Take 0.5 tablets (10 mg total) by mouth daily. 08/09/11 08/08/12  Cheri GuppyJeffrey Caporossi, MD  lisinopril (PRINIVIL,ZESTRIL) 5 MG tablet Take 1 tablet (5 mg total) by mouth daily. 05/05/11 05/04/12  Belkys A Regalado, MD  simvastatin (ZOCOR) 40 MG tablet Take 40 mg by mouth at bedtime.      Historical Provider, MD    Family History Family History  Problem Relation Age of Onset  . Malignant hyperthermia Mother     Social History Social History  Substance Use Topics  . Smoking status: Current Every Day Smoker    Packs/day: 0.25    Years: 30.00    Types: Cigarettes  . Smokeless tobacco: Never Used  . Alcohol use Yes     Allergies   Review of patient's allergies indicates no known allergies.   Review of Systems Review of Systems  Constitutional: Negative for chills and fever.  Respiratory: Negative for shortness of breath.   Cardiovascular: Negative for chest pain.  Gastrointestinal: Negative for abdominal pain.  Neurological: Positive for dizziness and light-headedness. Negative for facial asymmetry and numbness.  All other systems reviewed and are negative.    Physical Exam Updated Vital Signs BP 117/72   Pulse 72   Temp 97.9 F (36.6 C)   Resp 16   Ht 6\' 1"  (1.854 m)   Wt 65.8 kg   SpO2 100%   BMI 19.13 kg/m   Physical Exam  Constitutional: He appears well-developed and well-nourished.  HENT:  Head: Normocephalic and atraumatic.  Eyes: Conjunctivae are normal.  Neck: Neck supple.  Cardiovascular: Normal rate and regular rhythm.   No murmur heard. Pulmonary/Chest: Effort normal and breath sounds normal. No respiratory distress.  Abdominal: Soft. There is no tenderness.  Musculoskeletal: He exhibits no edema.  Neurological: He is alert.  Skin: Skin is warm and dry.  Psychiatric: He has a normal mood and affect.  Nursing note and vitals reviewed.    ED Treatments / Results  Labs (all labs ordered are listed, but only abnormal  results are displayed) Labs Reviewed  PROTIME-INR - Abnormal; Notable for the following:       Result Value   Prothrombin Time 22.5 (*)    All other components within normal limits  BASIC METABOLIC PANEL  CBC  BRAIN NATRIURETIC PEPTIDE  I-STAT TROPOININ, ED    EKG  EKG Interpretation None       Radiology No results found.  Procedures Procedures (including critical care time)  Medications Ordered in ED Medications - No data to display   Initial Impression / Assessment and Plan / ED Course  I have reviewed the triage vital signs and the nursing notes.  Pertinent labs & imaging results that were available during my care of the patient were reviewed by me and considered in my medical decision making (see chart for details).  Clinical Course   Patient is a 65 year old male with past medical history of multiple substance abuse, hypertension, chronic systolic congestive heart failure who comes in today with an abnormal EKG finding. Patient currently anticoagulated on Coumadin. Patient states that he went to go for a walk like he does every day when he was feeling slightly dizzy and "not right." Patient return to the shelter to take his Coumadin. EKG was conducted which showed a right bundle branch block with frequent wide PVCs. Patient denies nausea vomiting shortness breath or chest pain. Patient denies fever chills.  Physical exam patient clear to auscultation bilaterally normal S1-S2 no rubs or murmurs gallops abdomen soft nontender. Remainder physical exam within normal limits.  Will conduct ACS workup given patient's prior history.  Chest x-ray showed no acute cardiopulmonary abnormalities. Laboratory workup largely within normal limits. Troponins negative. Patient slightly elevated BNP however this is not far from baseline. Again the patient propria return in follow-up precautions. Patient amenable to discharge at this time. Patient voiced understanding.   Final Clinical  Impressions(s) / ED Diagnoses   Final diagnoses:  None    New Prescriptions New Prescriptions   No medications on file     Caren Griffins, MD 01/06/16 2209    Blane Ohara, MD 01/07/16 [phone]

## 2016-01-04 NOTE — ED Triage Notes (Signed)
Pt to ED by EMS c/o not feeling well. Pt went for a walk like usual every day, came back to the shelter to take his Coumadin, and began feeling dizzy and "not right." Reports feeling palpitations. RBBB noted on EKG with frequent, wide PVCs

## 2016-01-29 ENCOUNTER — Emergency Department (HOSPITAL_COMMUNITY): Payer: Medicare Other

## 2016-01-29 ENCOUNTER — Inpatient Hospital Stay (HOSPITAL_COMMUNITY)
Admission: EM | Admit: 2016-01-29 | Discharge: 2016-02-01 | DRG: 191 | Disposition: A | Discharge status: Home / Self Care | Payer: Medicare Other | Attending: Oncology | Admitting: Oncology

## 2016-01-29 ENCOUNTER — Encounter (HOSPITAL_COMMUNITY): Payer: Self-pay | Admitting: Emergency Medicine

## 2016-01-29 DIAGNOSIS — F17210 Nicotine dependence, cigarettes, uncomplicated: Secondary | ICD-10-CM | POA: Diagnosis present

## 2016-01-29 DIAGNOSIS — E876 Hypokalemia: Secondary | ICD-10-CM | POA: Diagnosis present

## 2016-01-29 DIAGNOSIS — Z7901 Long term (current) use of anticoagulants: Secondary | ICD-10-CM | POA: Diagnosis not present

## 2016-01-29 DIAGNOSIS — Z8673 Personal history of transient ischemic attack (TIA), and cerebral infarction without residual deficits: Secondary | ICD-10-CM

## 2016-01-29 DIAGNOSIS — Z978 Presence of other specified devices: Secondary | ICD-10-CM

## 2016-01-29 DIAGNOSIS — R683 Clubbing of fingers: Secondary | ICD-10-CM | POA: Diagnosis present

## 2016-01-29 DIAGNOSIS — Z79899 Other long term (current) drug therapy: Secondary | ICD-10-CM

## 2016-01-29 DIAGNOSIS — H18419 Arcus senilis, unspecified eye: Secondary | ICD-10-CM | POA: Diagnosis present

## 2016-01-29 DIAGNOSIS — I2510 Atherosclerotic heart disease of native coronary artery without angina pectoris: Secondary | ICD-10-CM | POA: Diagnosis present

## 2016-01-29 DIAGNOSIS — J9312 Secondary spontaneous pneumothorax: Secondary | ICD-10-CM | POA: Diagnosis present

## 2016-01-29 DIAGNOSIS — J939 Pneumothorax, unspecified: Secondary | ICD-10-CM | POA: Diagnosis present

## 2016-01-29 DIAGNOSIS — Z9689 Presence of other specified functional implants: Secondary | ICD-10-CM

## 2016-01-29 DIAGNOSIS — I5022 Chronic systolic (congestive) heart failure: Secondary | ICD-10-CM | POA: Diagnosis present

## 2016-01-29 DIAGNOSIS — J849 Interstitial pulmonary disease, unspecified: Secondary | ICD-10-CM | POA: Diagnosis present

## 2016-01-29 DIAGNOSIS — I5032 Chronic diastolic (congestive) heart failure: Secondary | ICD-10-CM | POA: Diagnosis not present

## 2016-01-29 DIAGNOSIS — I110 Hypertensive heart disease with heart failure: Secondary | ICD-10-CM | POA: Diagnosis present

## 2016-01-29 DIAGNOSIS — J441 Chronic obstructive pulmonary disease with (acute) exacerbation: Secondary | ICD-10-CM | POA: Diagnosis present

## 2016-01-29 DIAGNOSIS — J9311 Primary spontaneous pneumothorax: Secondary | ICD-10-CM

## 2016-01-29 DIAGNOSIS — Z590 Homelessness: Secondary | ICD-10-CM

## 2016-01-29 LAB — CBC WITH DIFFERENTIAL/PLATELET
BASOS PCT: 0 %
Basophils Absolute: 0 10*3/uL (ref 0.0–0.1)
EOS ABS: 0 10*3/uL (ref 0.0–0.7)
Eosinophils Relative: 0 %
HCT: 44.9 % (ref 39.0–52.0)
HEMOGLOBIN: 14.6 g/dL (ref 13.0–17.0)
LYMPHS ABS: 1.7 10*3/uL (ref 0.7–4.0)
Lymphocytes Relative: 25 %
MCH: 28.3 pg (ref 26.0–34.0)
MCHC: 32.5 g/dL (ref 30.0–36.0)
MCV: 87 fL (ref 78.0–100.0)
Monocytes Absolute: 0.3 10*3/uL (ref 0.1–1.0)
Monocytes Relative: 4 %
NEUTROS ABS: 4.7 10*3/uL (ref 1.7–7.7)
NEUTROS PCT: 71 %
Platelets: 162 10*3/uL (ref 150–400)
RBC: 5.16 MIL/uL (ref 4.22–5.81)
RDW: 15.3 % (ref 11.5–15.5)
WBC: 6.8 10*3/uL (ref 4.0–10.5)

## 2016-01-29 LAB — COMPREHENSIVE METABOLIC PANEL
ALBUMIN: 3.6 g/dL (ref 3.5–5.0)
ALK PHOS: 66 U/L (ref 38–126)
ALT: 21 U/L (ref 17–63)
AST: 28 U/L (ref 15–41)
Anion gap: 8 (ref 5–15)
BUN: 13 mg/dL (ref 6–20)
CALCIUM: 8.8 mg/dL — AB (ref 8.9–10.3)
CO2: 23 mmol/L (ref 22–32)
CREATININE: 1.2 mg/dL (ref 0.61–1.24)
Chloride: 109 mmol/L (ref 101–111)
GFR calc Af Amer: >60 mL/min (ref >60)
GFR calc non Af Amer: >60 mL/min (ref >60)
GLUCOSE: 133 mg/dL — AB (ref 65–99)
Potassium: 3.2 mmol/L — ABNORMAL LOW (ref 3.5–5.1)
SODIUM: 140 mmol/L (ref 135–145)
Total Bilirubin: 0.5 mg/dL (ref 0.3–1.2)
Total Protein: 6.2 g/dL — ABNORMAL LOW (ref 6.5–8.1)

## 2016-01-29 LAB — PROTIME-INR
INR: 1.73
Prothrombin Time: 20.5 seconds — ABNORMAL HIGH (ref 11.4–15.2)

## 2016-01-29 LAB — BRAIN NATRIURETIC PEPTIDE: B NATRIURETIC PEPTIDE 5: 316.5 pg/mL — AB (ref 0.0–100.0)

## 2016-01-29 LAB — TROPONIN I

## 2016-01-29 LAB — MAGNESIUM: Magnesium: 2.2 mg/dL (ref 1.7–2.4)

## 2016-01-29 MED ORDER — WARFARIN - PHARMACIST DOSING INPATIENT
Freq: Every day | Status: DC
  Administered 2016-01-29: 18:00:00

## 2016-01-29 MED ORDER — ALBUTEROL SULFATE (2.5 MG/3ML) 0.083% IN NEBU
5.0000 mg | INHALATION_SOLUTION | Freq: Once | RESPIRATORY_TRACT | Status: AC
  Administered 2016-01-29: 5 mg via RESPIRATORY_TRACT
  Filled 2016-01-29: qty 6

## 2016-01-29 MED ORDER — OXYCODONE-ACETAMINOPHEN 5-325 MG PO TABS
1.0000 | ORAL_TABLET | ORAL | Status: DC | PRN
  Administered 2016-01-29 – 2016-02-01 (×12): 1 via ORAL
  Filled 2016-01-29 (×13): qty 1

## 2016-01-29 MED ORDER — METHYLPREDNISOLONE SODIUM SUCC 125 MG IJ SOLR
125.0000 mg | Freq: Once | INTRAMUSCULAR | Status: DC
  Filled 2016-01-29: qty 2

## 2016-01-29 MED ORDER — WARFARIN SODIUM 7.5 MG PO TABS
7.5000 mg | ORAL_TABLET | Freq: Once | ORAL | Status: AC
  Administered 2016-01-29: 7.5 mg via ORAL
  Filled 2016-01-29: qty 1

## 2016-01-29 MED ORDER — LIDOCAINE HCL (PF) 1 % IJ SOLN
INTRAMUSCULAR | Status: AC
  Administered 2016-01-29: 30 mL
  Filled 2016-01-29: qty 30

## 2016-01-29 MED ORDER — FUROSEMIDE 40 MG PO TABS
40.0000 mg | ORAL_TABLET | Freq: Every day | ORAL | Status: DC
  Administered 2016-01-29 – 2016-02-01 (×4): 40 mg via ORAL
  Filled 2016-01-29 (×4): qty 1

## 2016-01-29 MED ORDER — POTASSIUM CHLORIDE CRYS ER 20 MEQ PO TBCR
40.0000 meq | EXTENDED_RELEASE_TABLET | Freq: Once | ORAL | Status: AC
  Administered 2016-01-29: 40 meq via ORAL
  Filled 2016-01-29: qty 2

## 2016-01-29 MED ORDER — SODIUM CHLORIDE 0.9 % IV SOLN
Freq: Once | INTRAVENOUS | Status: AC
  Administered 2016-01-29: 07:00:00 via INTRAVENOUS

## 2016-01-29 MED ORDER — SODIUM CHLORIDE 0.9% FLUSH
3.0000 mL | Freq: Two times a day (BID) | INTRAVENOUS | Status: DC
  Administered 2016-01-29 – 2016-01-31 (×5): 3 mL via INTRAVENOUS

## 2016-01-29 MED ORDER — OXYCODONE-ACETAMINOPHEN 5-325 MG PO TABS
1.0000 | ORAL_TABLET | Freq: Four times a day (QID) | ORAL | Status: DC | PRN
  Administered 2016-01-29: 1 via ORAL
  Filled 2016-01-29: qty 1

## 2016-01-29 MED ORDER — CARVEDILOL 3.125 MG PO TABS
1.5625 mg | ORAL_TABLET | Freq: Two times a day (BID) | ORAL | Status: DC
  Administered 2016-01-29 – 2016-02-01 (×6): 1.5625 mg via ORAL
  Filled 2016-01-29 (×6): qty 1

## 2016-01-29 MED ORDER — FENTANYL CITRATE (PF) 100 MCG/2ML IJ SOLN
100.0000 ug | Freq: Once | INTRAMUSCULAR | Status: AC
  Administered 2016-01-29: 25 ug via INTRAVENOUS
  Filled 2016-01-29: qty 2

## 2016-01-29 MED ORDER — SODIUM CHLORIDE 0.9 % IV BOLUS (SEPSIS)
500.0000 mL | Freq: Once | INTRAVENOUS | Status: AC
  Administered 2016-01-29: 500 mL via INTRAVENOUS

## 2016-01-29 MED ORDER — PREDNISONE 20 MG PO TABS
40.0000 mg | ORAL_TABLET | Freq: Every day | ORAL | Status: DC
  Administered 2016-01-30 – 2016-02-01 (×3): 40 mg via ORAL
  Filled 2016-01-29 (×3): qty 2

## 2016-01-29 MED ORDER — ACETAMINOPHEN 325 MG PO TABS
650.0000 mg | ORAL_TABLET | Freq: Four times a day (QID) | ORAL | Status: DC | PRN
  Administered 2016-01-29: 650 mg via ORAL
  Filled 2016-01-29: qty 2

## 2016-01-29 MED ORDER — IPRATROPIUM BROMIDE 0.02 % IN SOLN
0.5000 mg | Freq: Once | RESPIRATORY_TRACT | Status: AC
  Administered 2016-01-29: 0.5 mg via RESPIRATORY_TRACT
  Filled 2016-01-29: qty 2.5

## 2016-01-29 MED ORDER — DOXYCYCLINE HYCLATE 100 MG PO TABS
100.0000 mg | ORAL_TABLET | Freq: Two times a day (BID) | ORAL | Status: DC
  Administered 2016-01-29 – 2016-02-01 (×7): 100 mg via ORAL
  Filled 2016-01-29 (×7): qty 1

## 2016-01-29 MED ORDER — IPRATROPIUM-ALBUTEROL 0.5-2.5 (3) MG/3ML IN SOLN
3.0000 mL | Freq: Once | RESPIRATORY_TRACT | Status: DC
Start: 2016-01-29 — End: 2016-02-01
  Filled 2016-01-29: qty 3

## 2016-01-29 MED ORDER — LISINOPRIL 5 MG PO TABS
5.0000 mg | ORAL_TABLET | Freq: Every day | ORAL | Status: DC
  Administered 2016-01-29 – 2016-02-01 (×4): 5 mg via ORAL
  Filled 2016-01-29 (×4): qty 1

## 2016-01-29 MED ORDER — IPRATROPIUM-ALBUTEROL 0.5-2.5 (3) MG/3ML IN SOLN: 3.0000 mL | Freq: Four times a day (QID) | RESPIRATORY_TRACT | Status: DC | PRN

## 2016-01-29 MED ORDER — ATORVASTATIN CALCIUM 40 MG PO TABS
40.0000 mg | ORAL_TABLET | Freq: Every day | ORAL | Status: DC
  Administered 2016-01-29 – 2016-01-31 (×3): 40 mg via ORAL
  Filled 2016-01-29 (×3): qty 1

## 2016-01-29 NOTE — ED Triage Notes (Signed)
Pt arrives to [facility] at this time via [facility] stretcher.  Pt reports that he has had shortness of breath and chest pain x 2 days.  EMS states that pt received 125 mg Solumedrol, 2 gram Magnesium sulfate, 10 mg albuterol, and 0.5 mg atrovent prior to arrival to the Ed.  Pt's oxygen saturation is noted to be 94% on room air.  Pt's lung sounds are noted to be diminished bilaterally in the bases with a productive cough of clear sputum.   Chief Complaint  Patient presents with  . Shortness of Breath    Pt reports chest pain and shortness of breath x 2 days.  Pt reports pain upon coughing.  Pain rated 9/10.   Past Medical History:  Diagnosis Date  . CAD (coronary artery disease)    nonobstructive  . Chronic systolic heart failure (HCC)   . Hypertension   . Stroke (HCC)   . Substance abuse

## 2016-01-29 NOTE — ED Provider Notes (Signed)
6:37 AM Pt signed out to me at shift change. Pt with cough and right sided pain for 3 days. Pt reports hx of pneumothorax 1 year ago, treated at [facility]. Pt currently in NAD. Speaking 3-4 word sentences. Maintaining oxygen sat in mid 90s on 2L of oxygen. CXR reviewed, showing pneumothorax. Official read pending.   7:05 AM X-ray resulted at 6:53. Place page for cardiothoracic. Spoke with Dr. Tyrone SageGerhardt, initially asked to call back to see if the attending would be willing to place chest tube. Spoke with Dr. Eudelia Bunchardama and Dr. Judd Lienelo, will ask Dr. Tyrone SageGerhardt to place the tube.   7:25 AM I repaged Dr. Tyrone SageGerhardt, however, he came down to ED and saw pt. Did not place tube because there was no equipment at bedside. I explained we werent sure what type of tube he wanted and wasn't sure if he was coming. Dr. Tyrone SageGerhardt left stating he had no time to wait.  7:30 AM Chest tube tray and chest tube at bedside.   7:46 AM CBC and BMP back. Will call hospitalist.   7:55 AM Spoke with IM teaching service, who will admit pt. Pt continues to maintain oxygen sat in 90s. NAD  8:32 AM Dr. Tyrone SageGerhardt at bedside for chest tube placement.   8:50 AM Pt  on coumadin, INR never drawn. Pt initially told me he is not taking it. Will order INR    Jaynie Crumbleatyana Blakelynn Scheeler, PA-C 01/29/16 1510    Geoffery Lyonsouglas Delo, MD 02/05/16 0830

## 2016-01-29 NOTE — ED Notes (Signed)
Fentanyl given 25 mcg per vo Dr. Tyrone SageGerhardt. Pt remains alert and responsaive.

## 2016-01-29 NOTE — ED Notes (Signed)
Pt placed on 2L O2  due to 88%.  Returned to 95%.

## 2016-01-29 NOTE — ED Notes (Signed)
Dr, Sofie RowerGerhardrt completes insertion of right chest tube. Pt tolerates failrly well. Remains alert and responsive through procedure. Tube connected to water seal pleurevac. Then connected to 20 of suction per VO Dr. Tyrone SageGerhardt read back and confirmed.Dressing applied to area around chest tube insertion site.tape applied to tube and collection devic e by Dr. Tyrone SageGerhardt.

## 2016-01-29 NOTE — Progress Notes (Signed)
Patient ID: [name], male   DOB: [birth year], 65 y.o.   MRN: [id number] EVENING ROUNDS NOTE :     [address]       [place] [address]             [phone]                     Total Length of Stay:  LOS: 0 days  BP 125/81 (BP Location: Left Arm)   Pulse 77   Temp 97.6 F (36.4 C) (Oral)   Resp 20   Ht 6\' 1"  (1.854 m)   Wt 145 lb (65.8 kg)   SpO2 97%   BMI 19.13 kg/m   .Intake/Output      09/19 0701 - 09/20 0700 09/20 0701 - 09/21 0700   IV Piggyback  500   Total Intake(mL/kg)  500 (7.6)   Net   +500              Lab Results  Component Value Date   WBC 6.8 01/29/2016   HGB 14.6 01/29/2016   HCT 44.9 01/29/2016   PLT 162 01/29/2016   GLUCOSE 133 (H) 01/29/2016   CHOL (H) 05/19/2010    206        ATP III CLASSIFICATION:  <200     mg/dL   Desirable  213-086  mg/dL   Borderline High  >=578    mg/dL   High          TRIG 81 05/19/2010   HDL 82 05/19/2010   LDLCALC (H) 05/19/2010    108        Total Cholesterol/HDL:CHD Risk Coronary Heart Disease Risk Table                     Men   Women  1/2 Average Risk   3.4   3.3  Average Risk       5.0   4.4  2 X Average Risk   9.6   7.1  3 X Average Risk  23.4   11.0        Use the calculated Patient Ratio above and the CHD Risk Table to determine the patient's CHD Risk.        ATP III CLASSIFICATION (LDL):  <100     mg/dL   Optimal  469-629  mg/dL   Near or Above                    Optimal  130-159  mg/dL   Borderline  528-413  mg/dL   High  >244     mg/dL   Very High   ALT 21 05/13/7251   AST 28 01/29/2016   NA 140 01/29/2016   K 3.2 (L) 01/29/2016   CL 109 01/29/2016   CREATININE 1.20 01/29/2016   BUN 13 01/29/2016   CO2 23 01/29/2016   TSH 0.816 07/06/2011   INR 1.73 01/29/2016   HGBA1C (H) 05/19/2010    5.9 (NOTE)                                                                       According to the ADA Clinical Practice Recommendations for 2011,  when HbA1c is used as a  screening test:   >=6.5%   Diagnostic of Diabetes Mellitus           (if abnormal result  is confirmed)  5.7-6.4%   Increased risk of developing Diabetes Mellitus  References:Diagnosis and Classification of Diabetes Mellitus,Diabetes Care,2011,34(Suppl 1):S62-S69 and Standards of Medical Care in         Diabetes - 2011,Diabetes Care,2011,34  (Suppl 1):S11-S61.   Dg Chest 2 View   Dg Chest Portable 1 View  Result Date: 01/29/2016 CLINICAL DATA:  Chest tube placement for pneumothorax EXAM: PORTABLE CHEST 1 VIEW COMPARISON:  Study obtained earlier in the day. FINDINGS: There is a chest tube on the right. There is only a modest apical pneumothorax currently. No tension component. There is moderate soft tissue air on the right laterally. There is chronic pleural thickening consistent with a degree of underlying fibrosis. There is no frank edema or consolidation. Heart size and pulmonary vascularity are normal. No adenopathy. No bone lesions. IMPRESSION: There is only a small right apical pneumothorax following chest tube placement on the right. There is moderate subcutaneous emphysema on the right laterally. No tension component. Underlying interstitial fibrotic type change. No frank edema or consolidation. Cardiac silhouette within normal limits. Electronically Signed   By: Bretta BangWilliam  Woodruff III M.D.   On: 01/29/2016 09:20    Stable on floor after chest tube placement No air leak respiratory status better    Delight OvensEdward B Dalton Mille MD  Beeper [phone] Office [phone] 01/29/2016 4:07 PM

## 2016-01-29 NOTE — Procedures (Signed)
      [address]       [name] [id number]             [phone]   Chest Tube Insertion Procedure Note  Indications:  Clinically significant Pneumothorax  Pre-operative Diagnosis: Pneumothorax  Post-operative Diagnosis: Pneumothorax  Procedure Details  Informed consent was obtained for the procedure, including sedation.  Risks of lung perforation, hemorrhage, arrhythmia, and adverse drug reaction were discussed.   After sterile skin prep, using standard technique, a 20 French tube was placed in the right lateral 8 rib space.  Findings: Air from ct site   Estimated Blood Loss:  Minimal         Specimens:  None              Complications:  None; patient tolerated the procedure well.         Disposition: admit hospital service         Condition: stable  Attending Attestation: I performed the procedure.  lidocaine local given 10 ml, fentyl 25 m  Patient on coumadin, gives histery of protime [facility], INR 2.2 and no change in dose  Final chest xray pending

## 2016-01-29 NOTE — ED Provider Notes (Signed)
[facility]-EMER[facility]ENCY DEPT Provider Note   CSN: [id number] Arrival date & time: 01/29/16  0420     History   Chief Complaint Chief Complaint  Patient presents with  . Shortness of Breath    Pt reports chest pain and shortness of breath x 2 days.  Pt reports pain upon coughing.  Pain rated 9/10.    HPI MARRIO SCRIBNER is a 65 y.o. male.  Patient comes to the emergency department for evaluation of cough, congestion, chills and shortness of breath. Chest pain occurs with cough, otherwise, no chest pain. No vomiting. He has a normal appetite. He reports cough is productive of white phlegm without hemoptysis. He feels his symptoms are related to living at a shelter with others that are ill and where it is not very warm.    The history is provided by the patient. No language interpreter was used.    Past Medical History:  Diagnosis Date  . CAD (coronary artery disease)    nonobstructive  . Chronic systolic heart failure (HCC)   . Hypertension   . Stroke (HCC)   . Substance abuse     Patient Active Problem List   Diagnosis Date Noted  . Acute on chronic systolic heart failure (HCC) 07/08/2011  . Hypokalemia 07/07/2011  . Flash pulmonary edema (HCC) 07/06/2011  . 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 Abnormal EKG 07/06/2011  . Chronic systolic congestive heart failure, NYHA class 3 (HCC) 05/05/2011  . Acute respiratory failure with hypoxia (HCC) 05/03/2011  . SUBSTANCE ABUSE, MULTIPLE 03/25/2010  . HYPERTENSION 03/25/2010    Past Surgical History:  Procedure Laterality Date  . JOINT REPLACEMENT  L knee       Home Medications    Prior to Admission medications   Medication Sig Start Date End Date Taking? Authorizing Provider  albuterol (PROVENTIL HFA;VENTOLIN HFA) 108 (90 BASE) MCG/ACT inhaler Inhale 1-2 puffs into the lungs every 6 (six) hours as needed for wheezing. Patient not taking: Reported on 01/04/2016 08/09/11 01/04/16  Cheri Guppy, MD    albuterol (PROVENTIL) 2 MG tablet Take 2 mg by mouth 3 (three) times daily. 08/07/11 08/06/12  Forbes Cellar, MD  carvedilol (COREG) 3.125 MG tablet Take 1.5625 mg by mouth 2 (two) times daily with a meal.  07/09/11 01/04/16  Leroy Sea, MD  digoxin (LANOXIN) 0.125 MG tablet Take 1 tablet (0.125 mg total) by mouth daily. Patient not taking: Reported on 01/04/2016 07/09/11 01/04/16  Leroy Sea, MD  furosemide (LASIX) 40 MG tablet Take 40 mg by mouth daily. 12/30/15   Historical Provider, MD  lisinopril (PRINIVIL,ZESTRIL) 20 MG tablet Take 0.5 tablets (10 mg total) by mouth daily. Patient not taking: Reported on 01/04/2016 08/09/11 01/04/16  Cheri Guppy, MD  lisinopril (PRINIVIL,ZESTRIL) 5 MG tablet Take 1 tablet (5 mg total) by mouth daily. Patient not taking: Reported on 01/04/2016 05/05/11 01/04/16  Belkys A Regalado, MD  lisinopril (PRINIVIL,ZESTRIL) 5 MG tablet Take 5 mg by mouth daily. 12/30/15   Historical Provider, MD  warfarin (COUMADIN) 5 MG tablet Take 5 mg by mouth daily. 12/02/15   Historical Provider, MD    Family History Family History  Problem Relation Age of Onset  . Malignant hyperthermia Mother     Social History Social History  Substance Use Topics  . Smoking status: Current Every Day Smoker    Packs/day: 0.25    Years: 30.00    Types: Cigarettes  . Smokeless tobacco: Never Used  . Alcohol use Yes  Allergies   Review of patient's allergies indicates no known allergies.   Review of Systems Review of Systems  Constitutional: Positive for chills. Negative for appetite change and fever.  HENT: Positive for congestion and rhinorrhea. Negative for sore throat and trouble swallowing.   Respiratory: Positive for cough and shortness of breath.   Cardiovascular: Positive for chest pain (See HPI.).  Gastrointestinal: Negative.  Negative for abdominal pain and vomiting.  Musculoskeletal: Positive for myalgias. Negative for neck stiffness.  Skin: Negative.   Negative for rash.  Neurological: Positive for weakness. Negative for headaches.     Physical Exam Updated Vital Signs BP 147/99 (BP Location: Right Arm)   Pulse 86   Temp 97.8 F (36.6 C) (Oral)   Resp 22   Ht 6\' 1"  (1.854 m)   Wt 65.8 kg   SpO2 94%   BMI 19.13 kg/m   Physical Exam  Constitutional: He appears well-developed and well-nourished.  HENT:  Head: Normocephalic.  Mouth/Throat: Oropharynx is clear and moist.  Eyes:  Arcus senilis   Neck: Normal range of motion. Neck supple.  Cardiovascular: Normal rate and regular rhythm.   No murmur heard. Pulmonary/Chest: Effort normal. No respiratory distress. He has wheezes. He has rales. He exhibits tenderness (Bilateral anterior chest wall).  Abdominal: Soft. Bowel sounds are normal. There is no tenderness. There is no rebound and no guarding.  Musculoskeletal: Normal range of motion.  Neurological: He is alert. No cranial nerve deficit.  Skin: Skin is warm and dry. No rash noted.  Psychiatric: He has a normal mood and affect.  Nursing note and vitals reviewed.    ED Treatments / Results  Labs (all labs ordered are listed, but only abnormal results are displayed) Labs Reviewed - No data to display  EKG  EKG Interpretation  Date/Time:  Wednesday January 29 2016 04:27:06 EDT Ventricular Rate:  83 PR Interval:    QRS Duration: 166 QT Interval:  452 QTC Calculation: 532 R Axis:   121 Text Interpretation:  Sinus rhythm Nonspecific intraventricular conduction delay Borderline abnrm T, anterolateral leads Confirmed by DELO  MD, DOUGLAS ([id number]) on 01/29/2016 4:45:24 AM       Radiology No results found.  Procedures Procedures (including critical care time)  Medications Ordered in ED Medications  albuterol (PROVENTIL) (2.5 MG/3ML) 0.083% nebulizer solution 5 mg (not administered)  ipratropium (ATROVENT) nebulizer solution 0.5 mg (not administered)     Initial Impression / Assessment and Plan / ED Course    I have reviewed the triage vital signs and the nursing notes.  Pertinent labs & imaging results that were available during my care of the patient were reviewed by me and considered in my medical decision making (see chart for details).  Clinical Course    Patient presents from homeless shelter with complaint of cough, chills, SOB and chest pain. Work up in progress. Patient care signed out to Jaynie Crumbleatyana Kirichenko, PA-C for test result review and disposition.   Final Clinical Impressions(s) / ED Diagnoses   Final diagnoses:  None   1. Cough 2. SOB 3. Chest pain New Prescriptions New Prescriptions   No medications on file     Elpidio AnisShari Lucca Ballo, PA-C 02/05/16 0349    Geoffery Lyonsouglas Delo, MD 02/05/16 [phone]

## 2016-01-29 NOTE — Progress Notes (Signed)
ANTICOAGULATION CONSULT NOTE - Initial Consult  Pharmacy Consult for warfarin  Indication: stroke  No Known Allergies  Patient Measurements: Height: 6\' 1"  (185.4 cm) Weight: 145 lb (65.8 kg) IBW/kg (Calculated) : 79.9  Vital Signs: Temp: 97.8 F (36.6 C) (09/20 0427) Temp Source: Oral (09/20 0427) BP: 119/78 (09/20 0945) Pulse Rate: 76 (09/20 0945)  Labs:  Recent Labs  01/29/16 0619 01/29/16 0922  HGB 14.6  --   HCT 44.9  --   PLT 162  --   LABPROT  --  20.5*  INR  --  1.73  CREATININE 1.20  --   TROPONINI <0.03  --     Estimated Creatinine Clearance: 59.4 mL/min (by C-G formula based on SCr of 1.2 mg/dL).   Medical History: Past Medical History:  Diagnosis Date  . CAD (coronary artery disease)    nonobstructive  . Chronic systolic heart failure (HCC)   . Hypertension   . Stroke (HCC)   . Substance abuse     Medications:  See EMR  Assessment: 65 yo male who complains of difficulty breathing. He is on warfarin after having a stroke earlier this year. His current INR is 1.7. He reports that he had his INR check on Monday 9/18 and his INR was 2.2. He confirms that he took warfarin as directed on Monday and Tuesday.  PTA warfarin: 5 mg po daily   Goal of Therapy:  INR 2-3 Monitor platelets by anticoagulation protocol: Yes   Plan:  -Warfarin 7.5 mg po x1 -Daily INR  Airiana Elman, Darl HouseholderAlison M 01/29/2016,10:07 AM

## 2016-01-29 NOTE — Consult Note (Signed)
[address]       [place] [address]             [phone]      Subjective:   BJ:YNWGN PNEUMOTHORAX  Hpi: The patient is a 65 year old male who presented to the emergency department with cough and right-sided chest pain for approximately 3 days. He was found to have a right sided pneumothorax we were consulted for chest tube placement. He has a previous history of pneumothorax approximately one year ago treated at [facility] but is uncertain whicjh side, he is a poor historian,   Patient Active Problem List   Diagnosis Date Noted  . 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 COPD exacerbation (HCC)   . History of CVA (cerebrovascular accident)   . Acute on chronic systolic heart failure (HCC) 07/08/2011  . Hypokalemia 07/07/2011  . Flash pulmonary edema (HCC) 07/06/2011  . 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 Abnormal EKG 07/06/2011  . Chronic systolic congestive heart failure (HCC) 05/05/2011  . Acute respiratory failure with hypoxia (HCC) 05/03/2011  . SUBSTANCE ABUSE, MULTIPLE 03/25/2010  . Essential hypertension 03/25/2010   Past Medical History:  Diagnosis Date  . CAD (coronary artery disease)    nonobstructive  . Chronic systolic heart failure (HCC)   . Hypertension   . Stroke (HCC)   . Substance abuse     Past Surgical History:  Procedure Laterality Date  . JOINT REPLACEMENT  L knee    Prescriptions Prior to Admission  Medication Sig Dispense Refill Last Dose  . carvedilol (COREG) 3.125 MG tablet Take 1.5625 mg by mouth 2 (two) times daily with a meal.    01/28/2016 at 1930  . furosemide (LASIX) 40 MG tablet Take 40 mg by mouth daily.   01/28/2016 at Unknown time  . lisinopril (PRINIVIL,ZESTRIL) 5 MG tablet Take 5 mg by mouth daily.   01/28/2016 at Unknown time  . warfarin (COUMADIN) 5 MG tablet Take 5 mg by mouth daily.   01/28/2016 at Unknown time   No Known Allergies  Social History  Substance  Use Topics  . Smoking status: Current Every Day Smoker    Packs/day: 0.25    Years: 30.00    Types: Cigarettes  . Smokeless tobacco: Never Used  . Alcohol use Yes    Family History  Problem Relation Age of Onset  . Malignant hyperthermia Mother      Review of Systems  Constitutional: Positive for malaise/fatigue. Negative for chills and fever.  HENT: Negative.   Eyes: Negative.   Respiratory: Positive for cough, sputum production, shortness of breath and wheezing.   Cardiovascular: Positive for chest pain.  Genitourinary: Positive for frequency and urgency.  Musculoskeletal: Negative.   Skin: Negative.   Neurological: Positive for speech change.       Left hane from previous CVA  Endo/Heme/Allergies: Negative.      Objective:   Patient Vitals for the past 8 hrs:  BP Temp Temp src Pulse Resp SpO2  01/29/16 1056 125/81 97.6 F (36.4 C) Oral 77 20 97 %  01/29/16 0945 119/78 - - 76 26 100 %  01/29/16 0930 120/84 - - 71 25 100 %  01/29/16 0915 121/83 - - 74 (!) 28 100 %  01/29/16 0910 124/82 - - 76 24 99 %  01/29/16 0905 128/88 - - 80 22 98 %  01/29/16 0900 138/97 - - 90 21 98 %  01/29/16 0855 122/78 - - 83  25 94 %  01/29/16 0850 147/96 - - 94 25 94 %  01/29/16 0845 110/70 - - 76 18 94 %  01/29/16 0840 114/79 - - 77 21 92 %  01/29/16 0835 112/75 - - 76 (!) 27 92 %  01/29/16 0830 110/77 - - 77 25 94 %    Physical Exam  Constitutional: He appears chronically ill.  HENT:  Mouth/Throat: Oropharynx is clear.  + dentures,+ arcus senilis, muddy sclerae  Neck: No JVD present. No neck adenopathy. No thyromegaly present.  Cardiovascular: Normal rate, regular rhythm and normal heart sounds.   Pulses:      Dorsalis pedis pulses are 0 on the right side, and 0 on the left side.       Posterior tibial pulses are 0 on the right side, and 0 on the left side.  Distant heart tones  Pulmonary/Chest: No stridor. Increased effort noted. He has no wheezes. He has no rales. Tenderness  is present which mimics chest pain.  Fair air exchange throughout(after CT placed)  Abdominal: Soft. He exhibits no mass. There is no hepatomegaly. There is tenderness.  Tender RUQ, guarding or rebound  Musculoskeletal: He exhibits no edema, tenderness or deformity.  Neurological: He is alert. He exhibits a cognitive deficit.  Skin: Skin is warm and dry. No rash noted. No jaundice or pallor.   Dg Chest 2 View  Result Date: 01/29/2016 CLINICAL DATA:  Initial evaluation for acute cough, shortness of breath. EXAM: CHEST  2 VIEW COMPARISON:  Prior radiograph from 01/04/2016. FINDINGS: Cardiac and mediastinal silhouettes are stable in size and contour, and remain within normal limits. Lungs are hyperinflated. Changes related underlying COPD present. There is a moderate-sized right pneumothorax at the right lung base. No mediastinal shift. Left lung is clear. No focal infiltrates. No pulmonary edema. Probable small right pleural effusion noted. No acute osseous abnormality. IMPRESSION: 1. Moderate size right pneumothorax. No significant mediastinal shift. 2. Small right pleural effusion. 3. COPD. Critical Value/emergent results were called by telephone at the time of interpretation on 01/29/2016 at 6:49 am to Dr. Geoffery Lyons , who verbally acknowledged these results. Electronically Signed   By: Rise Mu M.D.   On: 01/29/2016 06:53   Dg Chest Portable 1 View  Result Date: 01/29/2016 CLINICAL DATA:  Chest tube placement for pneumothorax EXAM: PORTABLE CHEST 1 VIEW COMPARISON:  Study obtained earlier in the day. FINDINGS: There is a chest tube on the right. There is only a modest apical pneumothorax currently. No tension component. There is moderate soft tissue air on the right laterally. There is chronic pleural thickening consistent with a degree of underlying fibrosis. There is no frank edema or consolidation. Heart size and pulmonary vascularity are normal. No adenopathy. No bone lesions.  IMPRESSION: There is only a small right apical pneumothorax following chest tube placement on the right. There is moderate subcutaneous emphysema on the right laterally. No tension component. Underlying interstitial fibrotic type change. No frank edema or consolidation. Cardiac silhouette within normal limits. Electronically Signed   By: Bretta Bang III M.D.   On: 01/29/2016 09:20    Results for orders placed or performed during the hospital encounter of 01/29/16 (from the past 24 hour(s))  CBC with Differential     Status: None   Collection Time: 01/29/16  6:19 AM  Result Value Ref Range   WBC 6.8 4.0 - 10.5 K/uL   RBC 5.16 4.22 - 5.81 MIL/uL   Hemoglobin 14.6 13.0 - 17.0 g/dL  HCT 44.9 39.0 - 52.0 %   MCV 87.0 78.0 - 100.0 fL   MCH 28.3 26.0 - 34.0 pg   MCHC 32.5 30.0 - 36.0 g/dL   RDW 45.415.3 09.811.5 - 11.915.5 %   Platelets 162 150 - 400 K/uL   Neutrophils Relative % 71 %   Neutro Abs 4.7 1.7 - 7.7 K/uL   Lymphocytes Relative 25 %   Lymphs Abs 1.7 0.7 - 4.0 K/uL   Monocytes Relative 4 %   Monocytes Absolute 0.3 0.1 - 1.0 K/uL   Eosinophils Relative 0 %   Eosinophils Absolute 0.0 0.0 - 0.7 K/uL   Basophils Relative 0 %   Basophils Absolute 0.0 0.0 - 0.1 K/uL  Comprehensive metabolic panel     Status: Abnormal   Collection Time: 01/29/16  6:19 AM  Result Value Ref Range   Sodium 140 135 - 145 mmol/L   Potassium 3.2 (L) 3.5 - 5.1 mmol/L   Chloride 109 101 - 111 mmol/L   CO2 23 22 - 32 mmol/L   Glucose, Bld 133 (H) 65 - 99 mg/dL   BUN 13 6 - 20 mg/dL   Creatinine, Ser 1.471.20 0.61 - 1.24 mg/dL   Calcium 8.8 (L) 8.9 - 10.3 mg/dL   Total Protein 6.2 (L) 6.5 - 8.1 g/dL   Albumin 3.6 3.5 - 5.0 g/dL   AST 28 15 - 41 U/L   ALT 21 17 - 63 U/L   Alkaline Phosphatase 66 38 - 126 U/L   Total Bilirubin 0.5 0.3 - 1.2 mg/dL   GFR calc non Af Amer >60 >60 mL/min   GFR calc Af Amer >60 >60 mL/min   Anion gap 8 5 - 15  Brain natriuretic peptide     Status: Abnormal   Collection Time:  01/29/16  6:19 AM  Result Value Ref Range   B Natriuretic Peptide 316.5 (H) 0.0 - 100.0 pg/mL  Troponin I     Status: None   Collection Time: 01/29/16  6:19 AM  Result Value Ref Range   Troponin I <0.03 <0.03 ng/mL  Protime-INR     Status: Abnormal   Collection Time: 01/29/16  9:22 AM  Result Value Ref Range   Prothrombin Time 20.5 (H) 11.4 - 15.2 seconds   INR 1.73   Magnesium     Status: None   Collection Time: 01/29/16  9:53 AM  Result Value Ref Range   Magnesium 2.2 1.7 - 2.4 mg/dL      Assessment:    Right spontaneous pneumothorax   Patient Active Problem List   Diagnosis Date Noted  . 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 COPD exacerbation (HCC)   . History of CVA (cerebrovascular accident)   . Acute on chronic systolic heart failure (HCC) 07/08/2011  . Hypokalemia 07/07/2011  . Flash pulmonary edema (HCC) 07/06/2011  . 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 Abnormal EKG 07/06/2011  . Chronic systolic congestive heart failure (HCC) 05/05/2011  . Acute respiratory failure with hypoxia (HCC) 05/03/2011  . SUBSTANCE ABUSE, MULTIPLE 03/25/2010  . Essential hypertension 03/25/2010    Plan:   Right chest tube placement, we will manage. Medicine to admit   Before placing ct patient gave history of taking coumadin daily, notes he had it checked yesterday and was found to be INR 2.2.  With patient with sob, on O2 and sat 92 , proceeded with placement of chest tube  Risks and options discussed  with patient   I have seen and examined  [name] and formulated the  above assessment  and plan.  Delight Ovens MD Beeper [phone] Office [phone] 01/29/2016 3:40 PM

## 2016-01-29 NOTE — ED Notes (Signed)
Dr. Tyrone SageGerhardt at bedside and time out completed on paper.

## 2016-01-29 NOTE — H&P (Signed)
Date: 01/29/2016               Patient Name:  [name] MRN: [id number]  DOB: 05/09/1951 Age / Sex: 65 y.o., male   PCP: [facility]         Medical Service: Internal Medicine Teaching Service         Attending Physician: Dr. Cyndie ChimeGranfortuna    First Contact: Dr. Mikey BussingHoffman Pager: [phone]  Second Contact: Dr. Lawerance BachBurns  Pager: [phone]       After Hours (After 5p/  First Contact Pager: [phone]  weekends / holidays): Second Contact Pager: [phone]   Chief Complaint: "I'm not breathing as good"  History of Present Illness: [name] is a 65yo man with PMHx of COPD, chronic systolic CHF, and prior stroke who presented to the ED with a 2-3 day history of worsening shortness of breath and right-sided chest pain. He notes his SOB and chest pain were the most severe last night and he noted a whistling sound when he was breathing so he came to the ED. He reports his right lung has collapsed before sometime last year and he thought that might be what had happened again. He states he had a tube placed on his right side at that time at [facility]. He reports an associated cough productive of yellow mucus. He has tried Mucinex with little relief. He denies fever, chills, and hemoptysis. He is currently homeless and [facility]. He reports sleeping under the air conditioning vent and thinks that may have contributed to him developing a "cold." He is currently not on any inhalers for his COPD.   In the ED, a CXR revealed a moderate size right-sided pneumothorax with no significant mediastinal shift. Cardiothoracic surgery came to bedside for chest tube insertion.   Meds:  Current Meds  Medication Sig  . carvedilol (COREG) 3.125 MG tablet Take 1.5625 mg by mouth 2 (two) times daily with a meal.   . furosemide (LASIX) 40 MG tablet Take 40 mg by mouth daily.  Marland Kitchen. lisinopril (PRINIVIL,ZESTRIL) 5 MG tablet Take 5 mg by mouth daily.  Marland Kitchen. warfarin  (COUMADIN) 5 MG tablet Take 5 mg by mouth daily.     Allergies: Allergies as of 01/29/2016  . (No Known Allergies)   Past Medical History:  Diagnosis Date  . CAD (coronary artery disease)    nonobstructive  . Chronic systolic heart failure (HCC)   . Hypertension   . Stroke (HCC)   . Substance abuse     Family History: Mother- malignant hyperthermia  Social History: Homeless[facility]. Cigarette smoker- 0.25 packs per day for last 30 years. Alcohol use. Denies illicit drug use.   Review of Systems: A complete ROS was negative except as per HPI.   Physical Exam: Blood pressure 123/79, pulse 83, temperature 97.8 F (36.6 C), temperature source Oral, resp. rate 26, height 6\' 1"  (1.854 m), weight 145 lb (65.8 kg), SpO2 94 %. General: thin man resting in bed, in mild distress due to chest tube placement  HEENT: [place]/AT, EOMI, sclera anicteric, mucus membranes moist CV: RRR, no m/g/r Pulm: Mildly labored breathing secondary to pain. Decreased breath sounds at right base. Left side clear.  Abd: BS+, soft, mild tenderness in RUQ Ext: warm, no peripheral edema Neuro: alert and oriented x 3, no focal deficits  Skin: Right sided chest tube in place. Tenderness around area.   EKG: Sinus rhythm  Nonspecific intraventricular conduction delay Borderline abnrm T, anterolateral leads  CXR: Moderate size right pneumothorax without significant mediastinal shift. Evidence of COPD.   Assessment & Plan by Problem:  Spontaneous Pneumothorax Likely Secondary to COPD Exacerbation: Patient presented with a 2-3 day history of worsening shortness of breath, productive cough, and right-sided chest pain found to have a right-sided pneumothorax. His pneumothorax is most likely due to COPD exacerbation. He also has a history of prior pneumothorax on that side per patient. Cardiothoracic surgery has placed a chest tube already. Will monitor with serial chest x-rays and treat his underlying  COPD exacerbation. He received Solu-medrol 125 mg by EMS.  - Follow up post-chest tube CXR - CXR in AM - Start Prednisone 40 mg daily in AM, give for 5 days - Duonebs Q6H PRN - Start Doxycycline 100 mg BID for 5 days   Chronic Systolic Heart Failure: Last echo in EPIC from 2012 shows EF 20-25% with severe inferior, inferoseptal, distal septal, latera land apical akinesis consistent with large dominant LCx or RCA/LCx territory infarct. However, patient had cardiac cath earlier in that year that showed nonobstructive CAD. His heart failure is thought to be nonischemic in nature due to uncontrolled hypertension and prior cocaine use. Patient states he has never had a discussion about getting an ICD. Does not appear volume overloaded on exam. Will continue home meds. - Continue Coreg 3.125 mg BID - Continue Lisinopril 5 mg daily - Continue Lasix 40 mg daily  - Continue to encourage smoking cessation - Will try to obtain records from [facility] to see if he has a more recent echo  Hx CVA: Reports he had a stroke in April this year. He was treated at [facility]. He states he was placed on Coumadin at that time as his "blood was thick." Most recent INR 2.2 per patient.  - Check INR - Coumadin per pharmacy - Will [facility] to obtain [facility]  Hypokalemia: K 3.2 on admission. - Repleted with K-Dur 40 mEq - Will check Mg - bmet in AM  HTN: BP stable in 120s-140s systolic. Will continue home meds. - Continue Coreg 3.125 mg BID - Continue Lisinopril 5 mg daily - Continue Lasix 40 mg daily   Diet: Heart healthy DVT PPx: Warfarin  Dispo: Admit patient to Inpatient with expected length of stay greater than 2 midnights.  Signed: Su Hoff, MD 01/29/2016, 8:20 AM  Pager: [phone]

## 2016-01-29 NOTE — ED Notes (Signed)
MD at bedside. 

## 2016-01-29 NOTE — ED Notes (Signed)
Lab at bedside

## 2016-01-29 NOTE — Progress Notes (Signed)
Pt received from ED. Pt oriented to room and equipment. Pt complains of pain at chest tube site - paged attending for PRN pain medication. VSS. Telemetry notified x2. Call light within reach, will continue to monitor.   Leonidas Rombergaitlin S Bumbledare, RN

## 2016-01-29 NOTE — ED Notes (Signed)
PCXR at bedside. Pt states he is breathing has improved.

## 2016-01-30 ENCOUNTER — Inpatient Hospital Stay (HOSPITAL_COMMUNITY): Payer: Medicare Other

## 2016-01-30 DIAGNOSIS — J939 Pneumothorax, unspecified: Secondary | ICD-10-CM | POA: Diagnosis present

## 2016-01-30 DIAGNOSIS — F17200 Nicotine dependence, unspecified, uncomplicated: Secondary | ICD-10-CM

## 2016-01-30 LAB — PROTIME-INR
INR: 2.07
PROTHROMBIN TIME: 23.6 s — AB (ref 11.4–15.2)

## 2016-01-30 LAB — BASIC METABOLIC PANEL
ANION GAP: 8 (ref 5–15)
BUN: 12 mg/dL (ref 6–20)
CALCIUM: 8.8 mg/dL — AB (ref 8.9–10.3)
CO2: 23 mmol/L (ref 22–32)
Chloride: 104 mmol/L (ref 101–111)
Creatinine, Ser: 1.05 mg/dL (ref 0.61–1.24)
GFR calc Af Amer: >60 mL/min (ref >60)
GLUCOSE: 85 mg/dL (ref 65–99)
Potassium: 3.9 mmol/L (ref 3.5–5.1)
SODIUM: 135 mmol/L (ref 135–145)

## 2016-01-30 LAB — HIV ANTIBODY (ROUTINE TESTING W REFLEX): HIV Screen 4th Generation wRfx: NONREACTIVE

## 2016-01-30 MED ORDER — WARFARIN SODIUM 5 MG PO TABS
5.0000 mg | ORAL_TABLET | Freq: Once | ORAL | Status: AC
  Administered 2016-01-30: 5 mg via ORAL
  Filled 2016-01-30: qty 1

## 2016-01-30 NOTE — Progress Notes (Addendum)
      [address]       [name]             [phone]      Subjective:  Patient complains of pain at chest tube site  Objective: Vital signs in last 24 hours: Temp:  [97.6 F (36.4 C)-98 F (36.7 C)] 97.9 F (36.6 C) (09/21 0410) Pulse Rate:  [61-94] 61 (09/21 0410) Cardiac Rhythm: Normal sinus rhythm;Bundle branch block (09/21 0700) Resp:  [18-28] 20 (09/21 0410) BP: (110-147)/(70-97) 122/87 (09/21 0801) SpO2:  [92 %-100 %] 97 % (09/21 0410) Weight:  [144 lb 6.4 oz (65.5 kg)] 144 lb 6.4 oz (65.5 kg) (09/21 0410)  Intake/Output from previous day: 09/20 0701 - 09/21 0700 In: 740 [P.O.:240; IV Piggyback:500] Out: 1175 [Urine:1160; Chest Tube:15]  General appearance: alert, cooperative and no distress Heart: regular rate and rhythm Lungs: clear to auscultation bilaterally Abdomen: soft, non-tender; bowel sounds normal; no masses,  no organomegaly Wound: clean and dry  Lab Results:  Recent Labs  01/29/16 0619  WBC 6.8  HGB 14.6  HCT 44.9  PLT 162   BMET:  Recent Labs  01/29/16 0619 01/30/16 0228  NA 140 135  K 3.2* 3.9  CL 109 104  CO2 23 23  GLUCOSE 133* 85  BUN 13 12  CREATININE 1.20 1.05  CALCIUM 8.8* 8.8*    PT/INR:  Recent Labs  01/30/16 0228  LABPROT 23.6*  INR 2.07   ABG    Component Value Date/Time   PHART 7.503 (H) 08/10/2011 0028   HCO3 24.2 (H) 08/10/2011 0028   TCO2 25 08/10/2011 0028   ACIDBASEDEF 4.0 (H) 05/03/2011 0401   O2SAT 92.0 08/10/2011 0028   CBG (last 3)  No results for input(s): GLUCAP in the last 72 hours.  Assessment/Plan:  1. Spontaneous Pneumothorax- S/P Chest tube placement- no air leak present, however CXR shows trace apical pneumothorax on right- will leave on suction today 2. Dispo- care per primary, repeat CXR in AM   LOS: 1 day    [name] 01/30/2016  Portable Chest 1 View  Result Date: 01/30/2016 CLINICAL DATA:  Pneumothorax.  Chest tube. EXAM: PORTABLE CHEST 1  VIEW COMPARISON:  01/29/2016.  07/11/2015.  07/09/2015. FINDINGS: Right chest tube noted in stable position. No pneumothorax noted on today's exam. Cardiomegaly with mild bilateral from interstitial prominence. Mild CHF cannot be excluded. Mild infiltrate/ pneumonia right lower lobe cannot be excluded. Underlying chronic interstitial lung disease is most likely present. No acute bony abnormality. IMPRESSION: 1. Right chest tube in stable position. No pneumothorax noted on today's exam right chest wall subcutaneous emphysema is again noted. 2. Cardiomegaly with pulmonary interstitial prominence consistent with congestive heart failure noted on today's exam. Mild infiltrate/ pneumonia right lung base cannot be excluded. Underlying chronic interstitial lung disease. Electronically Signed   By: Maisie Fushomas  Register   On: 01/30/2016 08:38   No air  leak, ct to water seal I have seen and examined [name] and agree with the above assessment  and plan.  Martin OvensEdward B Taydon Nasworthy MD Beeper [phone] Office [phone] 01/30/2016 2:37 PM

## 2016-01-30 NOTE — Care Management Note (Addendum)
Case Management Note Donn PieriniKristi Terianna Peggs RN, BSN Unit 2W-Case Manager [phone]  Patient Details  Name: Martin AbedMelvin L Vazquez MRN: [id number] Date of Birth: 03/07/1951  Subjective/Objective:     Pt admitted with spont. pntx- chest tube placed               Action/Plan: PTA pt homeless, [facility]- independent- CM to follow for d/c needs- PCP- [facility]- CSW consulted   Expected Discharge Date:                  Expected Discharge Plan:    In-House Referral:     Discharge planning Services  CM Consult  Post Acute Care Choice:    Choice offered to:     DME Arranged:    DME Agency:     HH Arranged:    HH Agency:     Status of Service:  In process, will continue to follow  If discussed at Long Length of Stay Meetings, dates discussed:    Additional Comments:  Darrold SpanWebster, Esmerelda Finnigan Hall, RN 01/30/2016, 11:25 AM

## 2016-01-30 NOTE — Progress Notes (Signed)
ANTICOAGULATION CONSULT NOTE - FOLLOW UP  Pharmacy Consult:  Coumadin Indication: stroke  No Known Allergies  Patient Measurements: Height: 6\' 1"  (185.4 cm) Weight: 144 lb 6.4 oz (65.5 kg) IBW/kg (Calculated) : 79.9  Vital Signs: Temp: 97.9 F (36.6 C) (09/21 0410) Temp Source: Oral (09/21 0410) BP: 112/75 (09/21 0410) Pulse Rate: 61 (09/21 0410)  Labs:  Recent Labs  01/29/16 0619 01/29/16 0922 01/30/16 0228  HGB 14.6  --   --   HCT 44.9  --   --   PLT 162  --   --   LABPROT  --  20.5* 23.6*  INR  --  1.73 2.07  CREATININE 1.20  --  1.05  TROPONINI <0.03  --   --     Estimated Creatinine Clearance: 67.6 mL/min (by C-G formula based on SCr of 1.05 mg/dL).    Assessment: [age] YOM admitted with pneumothorax now s/p chest tube placement.  Patient continues on Coumadin from PTA for a recent history of CVA.  INR therapeutic; no bleeding reported.  PTA warfarin: 5 mg po daily   Goal of Therapy:  INR 2-3    Plan:  Coumadin 5mg  PO today Daily PT / INR    Malachai Schalk D. Laney Potashang, PharmD, BCPS Pager:  [phone] 01/30/2016, 7:46 AM

## 2016-01-30 NOTE — Progress Notes (Signed)
   Subjective: Patient was evaluated this morning on rounds. Patient was laying comfortably in bed. He has not had issues with shortness of breath overnight.  He denies any chest pain. He continues to have pain at the incision site but says it's well-controlled with current pain medication.  Objective:  Vital signs in last 24 hours: Vitals:   01/29/16 2105 01/30/16 0410 01/30/16 0801 01/30/16 0804  BP: 124/82 112/75 122/87 122/87  Pulse: 73 61  68  Resp: 18 20    Temp: 98 F (36.7 C) 97.9 F (36.6 C)    TempSrc: Oral Oral    SpO2: 98% 97%    Weight:  144 lb 6.4 oz (65.5 kg)    Height:       Physical Exam  Constitutional:  Frail  Cardiovascular: Normal rate, regular rhythm and normal heart sounds.  Exam reveals no gallop and no friction rub.   No murmur heard. Pulmonary/Chest: Effort normal. No respiratory distress. He has no wheezes. He has no rales.  Crackles noted on the lower lung fields on the right   Abdominal: Soft. He exhibits no distension. There is no tenderness.  Musculoskeletal: He exhibits no edema.  Neurological: He is alert.  Skin: Skin is warm and dry.  Psychiatric: Affect normal.     Assessment/Plan:  Active Problems:   Secondary spontaneous pneumothorax   COPD exacerbation (HCC)   History of CVA (cerebrovascular accident)  Secondary Spontaneous Pneumothorax likely due to COPD This is the patient's second spontaneous pneumothorax in the last year.  He currently denies any shortness of breath or chest pain.  On exam he has crackles on the lower lung field on the right.  He is engaged in conversation and speaking in complete sentences.  Chest tube continues to be in place.  Chest X ray shows no air leak but trace apical pneumothorax on right.  He will continue the suction today.  Because this is the patient's second pneumothorax in his 5960s we will obtain a CT of the chest when patient is stable and chest tube is removed.  I am concerned for malignancy as  patient has a 30 year smoking history.  - Continue Chest tube - Repeat chest x-ray in morning - Prednisone 40mg  daily (end 9/25) - Duonebs Q6H PRN - Doxycycline 100mg  BID (end 9/24)   Chronic Systolic Heart Failure Patient does not have signs of overload.  He is not SOB or have any lower extremity swelling -  Coreg 3.125 mg BID -  Lisinopril 5 mg daily -  Lasix 40 mg daily   Hx CVA Patient does not complain of any weakness.  INR is 2.07 this morning.  - INR check daily - Coumadin per pharmacy  Hypertension BP 112/75 at goal and stable.   - Coreg 3.125 mg BID - Lisinopril 5 mg daily - Lasix 40 mg daily  Hypokalemia Today K 3.9 after K-Dur 40mEq.  Magnesium was 2.2. -Resolved    Dispo: Anticipated discharge in approximately 2 day(s).   Camelia PhenesJessica Ratliff Loralai Eisman, DO 01/30/2016, 10:20 AM Pager: [phone]

## 2016-01-31 ENCOUNTER — Inpatient Hospital Stay (HOSPITAL_COMMUNITY): Payer: Medicare Other

## 2016-01-31 DIAGNOSIS — I5032 Chronic diastolic (congestive) heart failure: Secondary | ICD-10-CM

## 2016-01-31 LAB — PROTIME-INR
INR: 2.45
Prothrombin Time: 27 seconds — ABNORMAL HIGH (ref 11.4–15.2)

## 2016-01-31 MED ORDER — GUAIFENESIN ER 600 MG PO TB12
600.0000 mg | ORAL_TABLET | Freq: Two times a day (BID) | ORAL | Status: DC
  Administered 2016-01-31 – 2016-02-01 (×3): 600 mg via ORAL
  Filled 2016-01-31 (×3): qty 1

## 2016-01-31 MED ORDER — WARFARIN SODIUM 2.5 MG PO TABS
2.5000 mg | ORAL_TABLET | Freq: Once | ORAL | Status: AC
Start: 2016-01-31 — End: 2016-01-31
  Administered 2016-01-31: 2.5 mg via ORAL
  Filled 2016-01-31: qty 1

## 2016-01-31 NOTE — Progress Notes (Addendum)
      [address]       [name] [id number]             [phone]           Subjective: Patient with pain at chest tube site.  Objective: Vital signs in last 24 hours: Temp:  [97.5 F (36.4 C)-97.9 F (36.6 C)] 97.8 F (36.6 C) (09/22 0439) Pulse Rate:  [60-69] 64 (09/22 0439) Cardiac Rhythm: Normal sinus rhythm (09/21 1900) Resp:  [18-22] 18 (09/22 0439) BP: (110-134)/(70-87) 124/84 (09/22 0439) SpO2:  [98 %-99 %] 99 % (09/22 0439) Weight:  [131 lb 9.8 oz (59.7 kg)] 131 lb 9.8 oz (59.7 kg) (09/22 0439)   Intake/Output from previous day: 09/21 0701 - 09/22 0700 In: 240 [P.O.:240] Out: 2073 [Urine:2050; Chest Tube:23]   Physical Exam:  Cardiovascular: RRR Pulmonary: Mostly clear Wounds: Dressing is clean and dry Chest Tube: to water seal, no air leak  Lab Results: CBC: Recent Labs  01/29/16 0619  WBC 6.8  HGB 14.6  HCT 44.9  PLT 162   BMET:  Recent Labs  01/29/16 0619 01/30/16 0228  NA 140 135  K 3.2* 3.9  CL 109 104  CO2 23 23  GLUCOSE 133* 85  BUN 13 12  CREATININE 1.20 1.05  CALCIUM 8.8* 8.8*    PT/INR:  Recent Labs  01/30/16 0228  LABPROT 23.6*  INR 2.07   ABG:  INR: Will add last result for INR, ABG once components are confirmed Will add last 4 CBG results once components are confirmed  Assessment/Plan:  1. CV - SR in the 60's.  2.  Pulmonary - Chest tube with minor output. Chest tube is to water seal. There is no air leak. CXR this am not take yet. If CXR stable, remove chest tube. Check CXR in am.   ZIMMERMAN,DONIELLE MPA-C 01/31/2016,7:53 AM   Feels much better today, more talkative  About [facility]. Says he is approved for apartment in early OCT Waiting for chest xray  I have seen and examined Luis AbedMelvin L Vizzini and agree with the above assessment  and plan.  Delight OvensEdward B Shadrach Bartunek MD Beeper [phone] Office [phone] 01/31/2016 8:11 AM

## 2016-01-31 NOTE — Progress Notes (Signed)
   Subjective: Patient was evaluated this morning on rounds. He was laying comfortably in bed and talkative during the interview. He denies any shortness of breath or chest pain.  He continues to have pain at the incision site but states that the pain medication is helping control the pain.   Objective:  Vital signs in last 24 hours: Vitals:   01/30/16 1710 01/30/16 2051 01/31/16 0439 01/31/16 0847  BP: 134/86 120/86 124/84 (!) 126/95  Pulse: 69 62 64 69  Resp:  18 18   Temp:  97.9 F (36.6 C) 97.8 F (36.6 C)   TempSrc:  Oral Oral   SpO2:  98% 99%   Weight:   131 lb 9.8 oz (59.7 kg)   Height:       Physical Exam  Cardiovascular: Normal rate, regular rhythm and normal heart sounds.  Exam reveals no gallop and no friction rub.   No murmur heard. Pulmonary/Chest: Effort normal. No respiratory distress.  Crackles noted on the right side  Abdominal: Soft. He exhibits no distension. There is no tenderness.  Musculoskeletal: He exhibits no edema.  Neurological: He is alert.  Skin: Skin is warm and dry.     Assessment/Plan:  Principal Problem:   Secondary spontaneous pneumothorax Active Problems:   COPD exacerbation (HCC)   History of CVA (cerebrovascular accident)   Pneumothorax on right  Secondary Spontaneous Pneumothorax likely due to COPD Patient has no complaints this morning except for pain at the incision site which is not new or worsening. He continues to get Percocet 5 mg Q4H PRN and states that this helps control the pain. Chest x-ray was completed this morning.  Per cardiology's note if chest x-ray stable chest tube may be removed. On removal of tube patient will be observed for 24 more hours and receive a CT chest to rule out any malignancy that may be provoking  recurrent spontaneous pneumothorax.  - Continue Chest tube - Repeat chest x-ray in morning after tube is taken out  - Prednisone 40mg  daily (end 9/25) - Duonebs Q6H PRN - Doxycycline 100mg  BID (end  9/24)  Chronic Systolic Heart Failure Patient does not have signs of overload.  He is not SOB or have any lower extremity swelling -  Coreg 3.125 mg BID -  Lisinopril 5 mg daily -  Lasix 40 mg daily   Hx CVA Patient does not complain of any weakness.   - Pharmacy following for coumadin dosing   Hypertension BP 126/95 at goal and stable.   - Coreg 3.125 mg BID - Lisinopril 5 mg daily - Lasix 40 mg daily  Dispo: Anticipated discharge in approximately 1-2 day(s).   Camelia PhenesJessica Ratliff Dray Dente, DO 01/31/2016, 11:05 AM Pager: [phone]

## 2016-01-31 NOTE — Clinical Social Work Note (Signed)
CSW met with patient. Patient report he has housing with [facility] and is able to return. He also reported that his [employer] case manager has arranged for him to have an apartment in the beginning of October.  CSW signing off.   [employer], LCSW [phone]

## 2016-01-31 NOTE — Progress Notes (Signed)
[name] put second order in to remove chest tube after fist order cancelled. Belenda CruiseKristin and myself removed tube. Pt. Tolerated procedure well.

## 2016-01-31 NOTE — Progress Notes (Signed)
Medicine attending: I examined this patient today together with resident physician Dr. Geralyn CorwinJessica Hoffman and I concur with her evaluation and management plan which we discussed together. We appreciate ongoing assistance from cardiovascular surgery. Chest x-ray this morning shows complete resolution of pneumothorax. We anticipate removal of the tube later today and then 24-hour observation prior to discharge. We would like to get a CT scan of the patient's chest to rule out additional pathology once the tube is out.

## 2016-01-31 NOTE — Plan of Care (Signed)
Problem: Education: Goal: Knowledge of [facility] Education information/materials will improve Outcome: Progressing Reviewed POC with pt.   

## 2016-01-31 NOTE — Progress Notes (Signed)
ANTICOAGULATION CONSULT NOTE - Follow Up Consult  Pharmacy Consult:  Coumadin Indication: hx stroke  No Known Allergies  Patient Measurements: Height: 6\' 1"  (185.4 cm) Weight: 131 lb 9.8 oz (59.7 kg) IBW/kg (Calculated) : 79.9  Vital Signs: Temp: 97.8 F (36.6 C) (09/22 0439) Temp Source: Oral (09/22 0439) BP: 126/95 (09/22 0847) Pulse Rate: 69 (09/22 0847)  Labs:  Recent Labs  01/29/16 0619 01/29/16 0922 01/30/16 0228 01/31/16 0959  HGB 14.6  --   --   --   HCT 44.9  --   --   --   PLT 162  --   --   --   LABPROT  --  20.5* 23.6* 27.0*  INR  --  1.73 2.07 2.45  CREATININE 1.20  --  1.05  --   TROPONINI <0.03  --   --   --     Estimated Creatinine Clearance: 61.6 mL/min (by C-G formula based on SCr of 1.05 mg/dL).   Assessment: 64yom on coumadin pta for hx CVA, admitted with spontaneous pneumothorax. INR 1.73 on admit and he had chest tube placed. Coumadin resumed. Today's INR is therapeutic but trending up quickly. Could be due to drug interaction with doxycycline (9/20>>). No CBC today.   PTA dose: 5mg  po daily   Goal of Therapy:  INR 2-3  Monitor platelets by anticoagulation protocol: Yes    Plan:  1) Coumadin 2.5mg  x 1 2) Daily INR  Louie CasaJennifer Britian Jentz, PharmD, BCPS 01/31/2016, 11:38 AM

## 2016-02-01 ENCOUNTER — Inpatient Hospital Stay (HOSPITAL_COMMUNITY): Payer: Medicare Other

## 2016-02-01 ENCOUNTER — Other Ambulatory Visit: Payer: Self-pay | Admitting: Internal Medicine

## 2016-02-01 LAB — PROTIME-INR
INR: 2.71
PROTHROMBIN TIME: 29.3 s — AB (ref 11.4–15.2)

## 2016-02-01 MED ORDER — ATORVASTATIN CALCIUM 40 MG PO TABS: 40.0000 mg | ORAL_TABLET | Freq: Every day | ORAL | 1 refills | Status: DC

## 2016-02-01 MED ORDER — TIOTROPIUM BROMIDE MONOHYDRATE 1.25 MCG/ACT IN AERS: 2.0000 | INHALATION_SPRAY | Freq: Every day | RESPIRATORY_TRACT | 1 refills | Status: DC

## 2016-02-01 MED ORDER — CARVEDILOL 3.125 MG PO TABS: 3.1250 mg | ORAL_TABLET | Freq: Two times a day (BID) | ORAL | 1 refills | Status: DC

## 2016-02-01 MED ORDER — ALBUTEROL SULFATE 108 (90 BASE) MCG/ACT IN AEPB: 1.0000 | INHALATION_SPRAY | Freq: Four times a day (QID) | RESPIRATORY_TRACT | 3 refills | Status: DC | PRN

## 2016-02-01 MED ORDER — WARFARIN SODIUM 2.5 MG PO TABS: 2.5000 mg | ORAL_TABLET | Freq: Once | ORAL | Status: DC

## 2016-02-01 MED ORDER — PREDNISONE 20 MG PO TABS: 40.0000 mg | ORAL_TABLET | Freq: Every day | ORAL | 0 refills | Status: DC

## 2016-02-01 MED ORDER — OXYCODONE-ACETAMINOPHEN 5-325 MG PO TABS: 1.0000 | ORAL_TABLET | ORAL | 0 refills | Status: DC | PRN

## 2016-02-01 MED ORDER — ASPIRIN 81 MG PO TABS: 81.0000 mg | ORAL_TABLET | Freq: Every day | ORAL | 1 refills | Status: DC

## 2016-02-01 MED ORDER — DOXYCYCLINE HYCLATE 100 MG PO TABS: 100.0000 mg | ORAL_TABLET | Freq: Two times a day (BID) | ORAL | 0 refills | Status: DC

## 2016-02-01 NOTE — Progress Notes (Signed)
Martin AbedMelvin L Vazquez to be D/C'd Home per MD order. Discussed with the patient and all questions fully answered.    Medication List    TAKE these medications   Albuterol Sulfate 108 (90 Base) MCG/ACT Aepb Inhale 1 puff into the lungs every 6 (six) hours as needed.   aspirin 81 MG tablet Take 1 tablet (81 mg total) by mouth daily.   atorvastatin 40 MG tablet Commonly known as:  LIPITOR Take 1 tablet (40 mg total) by mouth daily at 6 PM.   carvedilol 3.125 MG tablet Commonly known as:  COREG Take 1 tablet (3.125 mg total) by mouth 2 (two) times daily with a meal. What changed:  how much to take   doxycycline 100 MG tablet Commonly known as:  VIBRA-TABS Take 1 tablet (100 mg total) by mouth every 12 (twelve) hours.   furosemide 40 MG tablet Commonly known as:  LASIX Take 40 mg by mouth daily.   lisinopril 5 MG tablet Commonly known as:  PRINIVIL,ZESTRIL Take 5 mg by mouth daily.   oxyCODONE-acetaminophen 5-325 MG tablet Commonly known as:  PERCOCET/ROXICET Take 1 tablet by mouth every 4 (four) hours as needed for severe pain.   predniSONE 20 MG tablet Commonly known as:  DELTASONE Take 2 tablets (40 mg total) by mouth daily with breakfast. Start taking on:  02/02/2016   Tiotropium Bromide Monohydrate 1.25 MCG/ACT Aers Commonly known as:  SPIRIVA RESPIMAT Inhale 2 puffs into the lungs daily.   warfarin 5 MG tablet Commonly known as:  COUMADIN Take 5 mg by mouth daily.       VVS, Skin clean, dry and intact without evidence of skin break down, no evidence of skin tears noted.  IV catheter discontinued intact. Site without signs and symptoms of complications. Dressing and pressure applied.  An After Visit Summary was printed and given to the patient.     [name], [name]  02/01/2016 3:47 PM

## 2016-02-01 NOTE — Discharge Summary (Signed)
Name: Martin AbedMelvin L Vazquez MRN: [id number] DOB: 06/13/1950 65 y.o. PCP: [facility]  Date of Admission: 01/29/2016  4:20 AM Date of Discharge: 02/01/2016 Attending Physician: Dr. Cyndie ChimeGranfortuna  Discharge Diagnosis:  Principal Problem:   Secondary spontaneous pneumothorax Active Problems:   COPD exacerbation (HCC)   History of CVA (cerebrovascular accident)   Pneumothorax on right   Discharge Medications:   Medication List    TAKE these medications   Albuterol Sulfate 108 (90 Base) MCG/ACT Aepb Inhale 1 puff into the lungs every 6 (six) hours as needed.   aspirin 81 MG tablet Take 1 tablet (81 mg total) by mouth daily.   atorvastatin 40 MG tablet Commonly known as:  LIPITOR Take 1 tablet (40 mg total) by mouth daily at 6 PM.   carvedilol 3.125 MG tablet Commonly known as:  COREG Take 1 tablet (3.125 mg total) by mouth 2 (two) times daily with a meal. What changed:  how much to take   doxycycline 100 MG tablet Commonly known as:  VIBRA-TABS Take 1 tablet (100 mg total) by mouth every 12 (twelve) hours.   furosemide 40 MG tablet Commonly known as:  LASIX Take 40 mg by mouth daily.   lisinopril 5 MG tablet Commonly known as:  PRINIVIL,ZESTRIL Take 5 mg by mouth daily.   oxyCODONE-acetaminophen 5-325 MG tablet Commonly known as:  PERCOCET/ROXICET Take 1 tablet by mouth every 4 (four) hours as needed for severe pain.   predniSONE 20 MG tablet Commonly known as:  DELTASONE Take 2 tablets (40 mg total) by mouth daily with breakfast. Start taking on:  02/02/2016   Tiotropium Bromide Monohydrate 1.25 MCG/ACT Aers Commonly known as:  SPIRIVA RESPIMAT Inhale 2 puffs into the lungs daily.   warfarin 5 MG tablet Commonly known as:  COUMADIN Take 5 mg by mouth daily.       Disposition and follow-up:   [name] [facility] Good [facility].  At the hospital follow up visit please address:  1.   Spontaneous Pneumothorax/COPD: Please assess compliance with inhalers and if he followed up with Cardiothoracic Surgery.  Mural Thrombus: Please check INR and assess compliance with Coumadin.   2.  Labs / imaging needed at time of follow-up: INR, Consider CT Chest given underlying COPD and recurrent Pneumothorax  3.  Pending labs/ test needing follow-up: None  Follow-up Appointments: Follow-up Information    Delight OvensEdward B Gerhardt, MD Follow up on 02/06/2016.   Specialty:  Cardiothoracic Surgery Why:  Appointment is at 9:30, please get CX[facility] at 9:00 [facility] located on first floor our office building Contact information: [address] [place] [address] [phone]        [facility]. Schedule an appointment as soon as possible for a visit today.   Why:  for follow up in 1-2 weeks Contact information: [address] [place] [address] [phone]           Hospital Course by problem list: Principal Problem:   Secondary spontaneous pneumothorax Active Problems:   COPD exacerbation (HCC)   History of CVA (cerebrovascular accident)   Pneumothorax on right   Spontaneous Pneumothorax Likely Secondary to COPD Exacerbation: Patient presented with a 2-3 day history of worsening shortness of breath, productive cough, and right-sided chest pain found to have a right-sided pneumothorax. His pneumothorax was most likely due to COPD exacerbation. He also has a history of prior pneumothorax on  that side per patient. Cardiothoracic surgery placed a chest tube which remained in place for 3 days while PTX resolved. After removal on 9/22, patient did well without recurrence. Patient will follow up with Cardiothoracic surgery as outpatient. He was treated for COPD exacerbation with prednisone, Duonebs, and Doxycycline. Given his recurrent PTX, we considered obtaining an inpatient CT Chest after removal of chest tube; however, patient refuse on  day of discharge as he was anxious to get home. Please consider CT Chest on follow up for further evaluation of lung pathology.  Chronic Systolic Heart Failure: Last echo in EPIC from 2017 at [facility] shows EF 20-25% with severe inferior, inferoseptal, distal septal, latera land apical akinesis consistent with large dominant LCx or RCA/LCx territory infarct. However, patient had cardiac cath earlier in that year that showed nonobstructive CAD. Euvolemic on exam. We continued home lasix, lisinopril and coreg.   Hx CVA: Reports he had a stroke in April this year. He was treated at [facility]. Patient was started on Coumadin due to a mural thrombus found on echo. INR 2.7 on the morning of discharge. Patient was discharged on Coumadin 5 mg QD. Please recheck INR on follow up.   HTN: Normotensive on home medications.  Discharge Vitals:   BP 113/80   Pulse 61   Temp 98.3 F (36.8 C) (Oral)   Resp 20   Ht 6\' 1"  (1.854 m)   Wt 131 lb 12.8 oz (59.8 kg)   SpO2 100%   BMI 17.39 kg/m    Discharge Instructions: Discharge Instructions    Diet - low sodium heart healthy    Complete by:  As directed    Increase activity slowly    Complete by:  As directed       Karlene Lineman, DO PGY-3 Internal Medicine Resident Pager # [phone] 02/01/2016 3:05 PM

## 2016-02-01 NOTE — Discharge Instructions (Signed)
Please take all medications as prescribed and please be sure to follow up with your doctors as listed in this packet. You need to follow up with your doctor for recheck of your blood INR since you are on coumadin.   Thank you for allowing us to be involved in your healthcare while you were hospitalized at [facility].   Please note that there have been changes to your home medications.  --> PLEASE LOOK AT YOUR DISCHARGE MEDICATION LIST FOR DETAILS.  Please call your PCP if you have any questions or concerns, or any difficulty getting any of your medications.  Please return to the ER if you have worsening of your symptoms or new severe symptoms arise.

## 2016-02-01 NOTE — Progress Notes (Signed)
      [address]       [place] [id number]             [phone]      Subjective:  No complaints.  Ready to go home.   Objective: Vital signs in last 24 hours: Temp:  [97.6 F (36.4 C)-98.3 F (36.8 C)] 98.3 F (36.8 C) (09/23 0456) Pulse Rate:  [61-77] 61 (09/23 0630) Cardiac Rhythm: Sinus bradycardia;Bundle branch block (09/23 0700) Resp:  [18-20] 20 (09/23 0456) BP: (113-121)/(74-83) 113/80 (09/23 0630) SpO2:  [97 %-100 %] 100 % (09/23 0456) Weight:  [131 lb 12.8 oz (59.8 kg)] 131 lb 12.8 oz (59.8 kg) (09/23 0456)  Intake/Output from previous day: 09/22 0701 - 09/23 0700 In: 240 [P.O.:240] Out: 400 [Urine:400]  General appearance: alert, cooperative and no distress Heart: regular rate and rhythm Lungs: clear to auscultation bilaterally Wound: clean and dry  Lab Results: No results for input(s): WBC, HGB, HCT, PLT in the last 72 hours. BMET:  Recent Labs  01/30/16 0228  NA 135  K 3.9  CL 104  CO2 23  GLUCOSE 85  BUN 12  CREATININE 1.05  CALCIUM 8.8*    PT/INR:  Recent Labs  02/01/16 0152  LABPROT 29.3*  INR 2.71   ABG    Component Value Date/Time   PHART 7.503 (H) 08/10/2011 0028   HCO3 24.2 (H) 08/10/2011 0028   TCO2 25 08/10/2011 0028   ACIDBASEDEF 4.0 (H) 05/03/2011 0401   O2SAT 92.0 08/10/2011 0028   CBG (last 3)  No results for input(s): GLUCAP in the last 72 hours.  Assessment/Plan:  1. S/P Pneumothorax- chest tube removed yesterday, F/U CXR shows no pneumothorax 2. Dispo- patient stable, no pneumothorax, okay to discharge home from our standpoint.... F/U appointment made and placed in chart  LOS: 3 days    Raford Vazquez, [name] 02/01/2016

## 2016-02-01 NOTE — Progress Notes (Addendum)
   Subjective: Patient was seen and examined this morning. He is ready to go home. He denies any chest pain or shortness of breath.   Objective: Vital signs in last 24 hours: Vitals:   01/31/16 1413 01/31/16 2008 02/01/16 0456 02/01/16 0630  BP: 114/83 121/81 114/74 113/80  Pulse: 67 65 77 61  Resp: 19 18 20    Temp: 97.6 F (36.4 C) 97.8 F (36.6 C) 98.3 F (36.8 C)   TempSrc: Oral Oral Oral   SpO2: 97% 100% 100%   Weight:   131 lb 12.8 oz (59.8 kg)   Height:       Physical Exam General: Vital signs reviewed.  Patient is chronically ill appearing, in no acute distress and cooperative with exam.  Cardiovascular: RRR Pulmonary/Chest: Inspiratory crackles in right lower lung field, subcutaneous emphysema, no wheezes or rhonchi.  Abdominal: Soft, non-tender, non-distended Extremities: No lower extremity edema bilaterally, pulses symmetric and intact bilaterally. Skin: Warm, dry and intact.   Assessment/Plan: Principal Problem:   Secondary spontaneous pneumothorax Active Problems:   COPD exacerbation (HCC)   History of CVA (cerebrovascular accident)   Pneumothorax on right  Spontaneous Pneumothorax Likely Secondary to COPD Exacerbation: Chest tube out last night. Patient has done well overnight without evidence of pneumothorax on CXR this morning. Patient will follow up with Cardiothoracic surgery as outpatient.  -Prednisone 40 mg daily for one more dose -Duonebs Q6H PRN -Doxycycline 100 mg BID for 3 more doses -Albuterol inhaler Q6H prn  -Spiriva 2 puffs QD -Follow up with CVTS on 02/06/16 at 9:30 am -Discharge to home today  Chronic Systolic Heart Failure: [facility] shows EF 20-25% with severe inferior, inferoseptal, distal septal, latera land apical akinesis consistent with large dominant LCx or RCA/LCx territory infarct. However, patient had cardiac cath earlier in that year that showed nonobstructive CAD. Euvolemic on exam.  -Continue Coreg 3.125  mg BID -Continue Lisinopril 5 mg daily -Continue Lasix 40 mg daily  -Continue to encourage smoking cessation  Hx CVA: Reports he had a stroke in April this year. [facility]. Patient was started on Coumadin due to a mural thrombus found on echo. INR 2.7 this morning. -Coumadin per pharmacy  HTN: Normotensive on home medications. -Continue Coreg 3.125 mg BID -Continue Lisinopril 5 mg daily -Continue Lasix 40 mg daily  Diet: Heart healthy DVT PPx: Warfarin   Dispo: Anticipated discharge in approximately 0 day(s).   LOS: 3 days   Karlene LinemanAlexa Burns, DO PGY-3 Internal Medicine Resident Pager # [phone] 02/01/2016 11:11 AM

## 2016-02-01 NOTE — Progress Notes (Signed)
ANTICOAGULATION CONSULT NOTE - Follow Up Consult  Pharmacy Consult:  Coumadin Indication: hx stroke  No Known Allergies  Patient Measurements: Height: 6\' 1"  (185.4 cm) Weight: 131 lb 12.8 oz (59.8 kg) IBW/kg (Calculated) : 79.9  Vital Signs: Temp: 98.3 F (36.8 C) (09/23 0456) Temp Source: Oral (09/23 0456) BP: 113/80 (09/23 0630) Pulse Rate: 61 (09/23 0630)  Labs:  Recent Labs  01/30/16 0228 01/31/16 0959 02/01/16 0152  LABPROT 23.6* 27.0* 29.3*  INR 2.07 2.45 2.71  CREATININE 1.05  --   --     Estimated Creatinine Clearance: 61.7 mL/min (by C-G formula based on SCr of 1.05 mg/dL).   Assessment: 64yom on coumadin pta for hx CVA, admitted with spontaneous pneumothorax. INR 1.73 on admit and he had chest tube placed. Coumadin resumed. Today's INR is therapeutic but trending up quickly to 2.71. Could be due to drug interaction with doxycycline (on through 9/24). No CBC today, previously wnl. No bleed documented.  PTA dose: 5mg  po daily   Goal of Therapy:  INR 2-3  Monitor platelets by anticoagulation protocol: Yes   Plan:  Coumadin 2.5mg  x 1 (reduced dose again while on doxy) Daily INR Monitor for s/sx bleeding  Babs BertinHaley Malaiyah Achorn, PharmD, BCPS Clinical Pharmacist Pager [phone] 02/01/2016 11:35 AM

## 2016-02-05 ENCOUNTER — Other Ambulatory Visit: Payer: Self-pay | Admitting: Cardiothoracic Surgery

## 2016-02-05 DIAGNOSIS — J9312 Secondary spontaneous pneumothorax: Secondary | ICD-10-CM

## 2016-02-06 ENCOUNTER — Ambulatory Visit: Payer: Self-pay | Admitting: Cardiothoracic Surgery

## 2016-07-09 ENCOUNTER — Emergency Department (HOSPITAL_COMMUNITY): Payer: Medicare Other

## 2016-07-09 ENCOUNTER — Encounter (HOSPITAL_COMMUNITY): Payer: Self-pay

## 2016-07-09 ENCOUNTER — Inpatient Hospital Stay (HOSPITAL_COMMUNITY)
Admission: EM | Admit: 2016-07-09 | Discharge: 2016-07-13 | DRG: 208 | Disposition: A | Discharge status: Home Health | Payer: Medicare Other | Attending: Internal Medicine | Admitting: Internal Medicine

## 2016-07-09 DIAGNOSIS — I2510 Atherosclerotic heart disease of native coronary artery without angina pectoris: Secondary | ICD-10-CM | POA: Diagnosis present

## 2016-07-09 DIAGNOSIS — I480 Paroxysmal atrial fibrillation: Secondary | ICD-10-CM | POA: Diagnosis not present

## 2016-07-09 DIAGNOSIS — N179 Acute kidney failure, unspecified: Secondary | ICD-10-CM | POA: Diagnosis present

## 2016-07-09 DIAGNOSIS — N182 Chronic kidney disease, stage 2 (mild): Secondary | ICD-10-CM | POA: Diagnosis not present

## 2016-07-09 DIAGNOSIS — Z79899 Other long term (current) drug therapy: Secondary | ICD-10-CM | POA: Diagnosis not present

## 2016-07-09 DIAGNOSIS — F14129 Cocaine abuse with intoxication, unspecified: Secondary | ICD-10-CM | POA: Diagnosis present

## 2016-07-09 DIAGNOSIS — E876 Hypokalemia: Secondary | ICD-10-CM | POA: Diagnosis not present

## 2016-07-09 DIAGNOSIS — Z7982 Long term (current) use of aspirin: Secondary | ICD-10-CM

## 2016-07-09 DIAGNOSIS — I130 Hypertensive heart and chronic kidney disease with heart failure and stage 1 through stage 4 chronic kidney disease, or unspecified chronic kidney disease: Secondary | ICD-10-CM | POA: Diagnosis present

## 2016-07-09 DIAGNOSIS — R0600 Dyspnea, unspecified: Secondary | ICD-10-CM | POA: Diagnosis present

## 2016-07-09 DIAGNOSIS — J9601 Acute respiratory failure with hypoxia: Secondary | ICD-10-CM | POA: Diagnosis present

## 2016-07-09 DIAGNOSIS — I161 Hypertensive emergency: Secondary | ICD-10-CM | POA: Diagnosis present

## 2016-07-09 DIAGNOSIS — J439 Emphysema, unspecified: Secondary | ICD-10-CM | POA: Diagnosis present

## 2016-07-09 DIAGNOSIS — G9340 Encephalopathy, unspecified: Secondary | ICD-10-CM | POA: Diagnosis not present

## 2016-07-09 DIAGNOSIS — Z8673 Personal history of transient ischemic attack (TIA), and cerebral infarction without residual deficits: Secondary | ICD-10-CM | POA: Diagnosis not present

## 2016-07-09 DIAGNOSIS — F17210 Nicotine dependence, cigarettes, uncomplicated: Secondary | ICD-10-CM | POA: Diagnosis present

## 2016-07-09 DIAGNOSIS — Z7901 Long term (current) use of anticoagulants: Secondary | ICD-10-CM | POA: Diagnosis not present

## 2016-07-09 DIAGNOSIS — R0602 Shortness of breath: Secondary | ICD-10-CM

## 2016-07-09 DIAGNOSIS — I5043 Acute on chronic combined systolic (congestive) and diastolic (congestive) heart failure: Secondary | ICD-10-CM | POA: Diagnosis present

## 2016-07-09 DIAGNOSIS — N183 Chronic kidney disease, stage 3 (moderate): Secondary | ICD-10-CM | POA: Diagnosis present

## 2016-07-09 DIAGNOSIS — J9602 Acute respiratory failure with hypercapnia: Secondary | ICD-10-CM | POA: Diagnosis present

## 2016-07-09 DIAGNOSIS — Z96652 Presence of left artificial knee joint: Secondary | ICD-10-CM | POA: Diagnosis present

## 2016-07-09 DIAGNOSIS — I255 Ischemic cardiomyopathy: Secondary | ICD-10-CM | POA: Diagnosis present

## 2016-07-09 DIAGNOSIS — I4891 Unspecified atrial fibrillation: Secondary | ICD-10-CM

## 2016-07-09 DIAGNOSIS — E785 Hyperlipidemia, unspecified: Secondary | ICD-10-CM | POA: Diagnosis present

## 2016-07-09 DIAGNOSIS — J42 Unspecified chronic bronchitis: Secondary | ICD-10-CM

## 2016-07-09 DIAGNOSIS — G9341 Metabolic encephalopathy: Secondary | ICD-10-CM | POA: Diagnosis present

## 2016-07-09 DIAGNOSIS — I5023 Acute on chronic systolic (congestive) heart failure: Secondary | ICD-10-CM

## 2016-07-09 DIAGNOSIS — I739 Peripheral vascular disease, unspecified: Secondary | ICD-10-CM | POA: Diagnosis present

## 2016-07-09 DIAGNOSIS — J438 Other emphysema: Secondary | ICD-10-CM | POA: Diagnosis not present

## 2016-07-09 DIAGNOSIS — J9690 Respiratory failure, unspecified, unspecified whether with hypoxia or hypercapnia: Secondary | ICD-10-CM | POA: Diagnosis present

## 2016-07-09 DIAGNOSIS — J810 Acute pulmonary edema: Secondary | ICD-10-CM | POA: Diagnosis not present

## 2016-07-09 LAB — I-STAT CHEM 8, ED
BUN: 19 mg/dL (ref 6–20)
CHLORIDE: 104 mmol/L (ref 101–111)
Calcium, Ion: 1.12 mmol/L — ABNORMAL LOW (ref 1.15–1.40)
Creatinine, Ser: 1.4 mg/dL — ABNORMAL HIGH (ref 0.61–1.24)
GLUCOSE: 144 mg/dL — AB (ref 65–99)
HEMATOCRIT: 49 % (ref 39.0–52.0)
HEMOGLOBIN: 16.7 g/dL (ref 13.0–17.0)
POTASSIUM: 3.9 mmol/L (ref 3.5–5.1)
Sodium: 140 mmol/L (ref 135–145)
TCO2: 27 mmol/L (ref 0–100)

## 2016-07-09 LAB — PROTIME-INR
INR: 1.44
Prothrombin Time: 17.6 seconds — ABNORMAL HIGH (ref 11.4–15.2)

## 2016-07-09 LAB — CBC WITH DIFFERENTIAL/PLATELET
BASOS ABS: 0 10*3/uL (ref 0.0–0.1)
BASOS PCT: 1 %
EOS PCT: 0 %
Eosinophils Absolute: 0 10*3/uL (ref 0.0–0.7)
HCT: 45.8 % (ref 39.0–52.0)
Hemoglobin: 14.8 g/dL (ref 13.0–17.0)
LYMPHS PCT: 42 %
Lymphs Abs: 2.9 10*3/uL (ref 0.7–4.0)
MCH: 28 pg (ref 26.0–34.0)
MCHC: 32.3 g/dL (ref 30.0–36.0)
MCV: 86.7 fL (ref 78.0–100.0)
Monocytes Absolute: 0.8 10*3/uL (ref 0.1–1.0)
Monocytes Relative: 11 %
NEUTROS ABS: 3.3 10*3/uL (ref 1.7–7.7)
Neutrophils Relative %: 46 %
PLATELETS: 169 10*3/uL (ref 150–400)
RBC: 5.28 MIL/uL (ref 4.22–5.81)
RDW: 13.6 % (ref 11.5–15.5)
WBC: 7.1 10*3/uL (ref 4.0–10.5)

## 2016-07-09 LAB — I-STAT ARTERIAL BLOOD GAS, ED
Acid-base deficit: 2 mmol/L (ref 0.0–2.0)
Bicarbonate: 25.8 mmol/L (ref 20.0–28.0)
O2 Saturation: 100 %
TCO2: 27 mmol/L (ref 0–100)
pCO2 arterial: 53.8 mmHg — ABNORMAL HIGH (ref 32.0–48.0)
pH, Arterial: 7.289 — ABNORMAL LOW (ref 7.350–7.450)
pO2, Arterial: 375 mmHg — ABNORMAL HIGH (ref 83.0–108.0)

## 2016-07-09 LAB — TROPONIN I: TROPONIN I: 0.06 ng/mL — AB (ref <0.03)

## 2016-07-09 LAB — I-STAT TROPONIN, ED: Troponin i, poc: 0.04 ng/mL (ref 0.00–0.08)

## 2016-07-09 LAB — I-STAT VENOUS BLOOD GAS, ED
Acid-base deficit: 2 mmol/L (ref 0.0–2.0)
Bicarbonate: 27.4 mmol/L (ref 20.0–28.0)
O2 Saturation: 48 %
PCO2 VEN: 62.3 mmHg — AB (ref 44.0–60.0)
TCO2: 29 mmol/L (ref 0–100)
pH, Ven: 7.251 (ref 7.250–7.430)
pO2, Ven: 31 mmHg — CL (ref 32.0–45.0)

## 2016-07-09 LAB — CBG MONITORING, ED: GLUCOSE-CAPILLARY: 117 mg/dL — AB (ref 65–99)

## 2016-07-09 LAB — PHOSPHORUS: PHOSPHORUS: 2.4 mg/dL — AB (ref 2.5–4.6)

## 2016-07-09 LAB — TRIGLYCERIDES: Triglycerides: 56 mg/dL (ref <150)

## 2016-07-09 LAB — MAGNESIUM: Magnesium: 1.9 mg/dL (ref 1.7–2.4)

## 2016-07-09 LAB — PROCALCITONIN: Procalcitonin: <0.1 ng/mL

## 2016-07-09 LAB — BRAIN NATRIURETIC PEPTIDE: B Natriuretic Peptide: 1627.3 pg/mL — ABNORMAL HIGH (ref 0.0–100.0)

## 2016-07-09 MED ORDER — FENTANYL CITRATE (PF) 100 MCG/2ML IJ SOLN
100.0000 ug | INTRAMUSCULAR | Status: DC | PRN
  Administered 2016-07-09 – 2016-07-10 (×2): 100 ug via INTRAVENOUS
  Filled 2016-07-09 (×2): qty 2

## 2016-07-09 MED ORDER — ASPIRIN 81 MG PO CHEW: 324.0000 mg | CHEWABLE_TABLET | Freq: Once | ORAL | Status: DC

## 2016-07-09 MED ORDER — SODIUM CHLORIDE 0.9 % IV SOLN
25.0000 ug/h | INTRAVENOUS | Status: DC
  Administered 2016-07-09: 50 ug/h via INTRAVENOUS
  Filled 2016-07-09: qty 50

## 2016-07-09 MED ORDER — IPRATROPIUM-ALBUTEROL 0.5-2.5 (3) MG/3ML IN SOLN
9.0000 mL | Freq: Once | RESPIRATORY_TRACT | Status: DC
  Filled 2016-07-09: qty 3

## 2016-07-09 MED ORDER — METHYLPREDNISOLONE SODIUM SUCC 125 MG IJ SOLR
125.0000 mg | Freq: Once | INTRAMUSCULAR | Status: AC
  Administered 2016-07-09: 125 mg via INTRAVENOUS
  Filled 2016-07-09: qty 2

## 2016-07-09 MED ORDER — ORAL CARE MOUTH RINSE
15.0000 mL | OROMUCOSAL | Status: DC
  Administered 2016-07-10 (×6): 15 mL via OROMUCOSAL

## 2016-07-09 MED ORDER — PROPOFOL 1000 MG/100ML IV EMUL
0.0000 ug/kg/min | INTRAVENOUS | Status: DC
  Administered 2016-07-09: 5 ug/kg/min via INTRAVENOUS
  Administered 2016-07-10: 30 ug/kg/min via INTRAVENOUS
  Filled 2016-07-09 (×2): qty 100

## 2016-07-09 MED ORDER — MIDAZOLAM HCL 2 MG/2ML IJ SOLN
2.0000 mg | INTRAMUSCULAR | Status: DC | PRN
  Administered 2016-07-09 – 2016-07-10 (×2): 2 mg via INTRAVENOUS
  Filled 2016-07-09 (×2): qty 2

## 2016-07-09 MED ORDER — HEPARIN BOLUS VIA INFUSION
3000.0000 [IU] | Freq: Once | INTRAVENOUS | Status: AC
  Administered 2016-07-09: 3000 [IU] via INTRAVENOUS
  Filled 2016-07-09: qty 3000

## 2016-07-09 MED ORDER — SODIUM CHLORIDE 0.9 % IV SOLN
INTRAVENOUS | Status: DC
  Administered 2016-07-10: 01:00:00 via INTRAVENOUS

## 2016-07-09 MED ORDER — PRO-STAT SUGAR FREE PO LIQD
30.0000 mL | Freq: Two times a day (BID) | ORAL | Status: DC
  Administered 2016-07-09 – 2016-07-10 (×2): 30 mL
  Filled 2016-07-09 (×2): qty 30

## 2016-07-09 MED ORDER — HEPARIN (PORCINE) IN NACL 100-0.45 UNIT/ML-% IJ SOLN
900.0000 [IU]/h | INTRAMUSCULAR | Status: DC
  Administered 2016-07-09: 850 [IU]/h via INTRAVENOUS
  Filled 2016-07-09 (×2): qty 250

## 2016-07-09 MED ORDER — ASPIRIN 81 MG PO TABS: 81.0000 mg | ORAL_TABLET | Freq: Every day | ORAL | Status: DC

## 2016-07-09 MED ORDER — CHLORHEXIDINE GLUCONATE 0.12% ORAL RINSE (MEDLINE KIT): 15.0000 mL | Freq: Two times a day (BID) | OROMUCOSAL | Status: DC

## 2016-07-09 MED ORDER — ORAL CARE MOUTH RINSE: 15.0000 mL | Freq: Four times a day (QID) | OROMUCOSAL | Status: DC

## 2016-07-09 MED ORDER — MIDAZOLAM HCL 2 MG/2ML IJ SOLN
2.0000 mg | INTRAMUSCULAR | Status: DC | PRN
  Administered 2016-07-09 (×2): 2 mg via INTRAVENOUS
  Filled 2016-07-09 (×3): qty 2

## 2016-07-09 MED ORDER — IPRATROPIUM-ALBUTEROL 0.5-2.5 (3) MG/3ML IN SOLN
3.0000 mL | Freq: Four times a day (QID) | RESPIRATORY_TRACT | Status: DC
  Administered 2016-07-09 – 2016-07-11 (×6): 3 mL via RESPIRATORY_TRACT
  Filled 2016-07-09 (×6): qty 3

## 2016-07-09 MED ORDER — PANTOPRAZOLE SODIUM 40 MG PO PACK
40.0000 mg | PACK | ORAL | Status: DC
  Administered 2016-07-09: 40 mg
  Filled 2016-07-09 (×2): qty 20

## 2016-07-09 MED ORDER — MIDAZOLAM HCL 2 MG/2ML IJ SOLN
2.0000 mg | INTRAMUSCULAR | Status: DC | PRN
  Administered 2016-07-09: 2 mg via INTRAVENOUS

## 2016-07-09 MED ORDER — VITAL HIGH PROTEIN PO LIQD
1000.0000 mL | ORAL | Status: DC
  Administered 2016-07-09: 1000 mL

## 2016-07-09 MED ORDER — DILTIAZEM HCL 25 MG/5ML IV SOLN: 20.0000 mg | Freq: Once | INTRAVENOUS | Status: DC

## 2016-07-09 MED ORDER — FENTANYL BOLUS VIA INFUSION
50.0000 ug | INTRAVENOUS | Status: DC | PRN
  Filled 2016-07-09: qty 50

## 2016-07-09 MED ORDER — ATORVASTATIN CALCIUM 40 MG PO TABS: 40.0000 mg | ORAL_TABLET | Freq: Every day | ORAL | Status: DC

## 2016-07-09 MED ORDER — ALBUTEROL SULFATE (2.5 MG/3ML) 0.083% IN NEBU: 2.5000 mg | INHALATION_SOLUTION | RESPIRATORY_TRACT | Status: DC | PRN

## 2016-07-09 MED ORDER — BUDESONIDE 0.5 MG/2ML IN SUSP
0.5000 mg | Freq: Two times a day (BID) | RESPIRATORY_TRACT | Status: DC
  Administered 2016-07-10 – 2016-07-11 (×3): 0.5 mg via RESPIRATORY_TRACT
  Filled 2016-07-09 (×3): qty 2

## 2016-07-09 MED ORDER — SUCCINYLCHOLINE CHLORIDE 20 MG/ML IJ SOLN
INTRAMUSCULAR | Status: AC | PRN
  Administered 2016-07-09: 90 mg via INTRAVENOUS

## 2016-07-09 MED ORDER — ETOMIDATE 2 MG/ML IV SOLN
INTRAVENOUS | Status: AC | PRN
  Administered 2016-07-09: 18 mg via INTRAVENOUS

## 2016-07-09 MED ORDER — FENTANYL CITRATE (PF) 100 MCG/2ML IJ SOLN: 50.0000 ug | Freq: Once | INTRAMUSCULAR | Status: DC

## 2016-07-09 MED ORDER — IPRATROPIUM-ALBUTEROL 0.5-2.5 (3) MG/3ML IN SOLN
3.0000 mL | Freq: Once | RESPIRATORY_TRACT | Status: AC
Start: 2016-07-09 — End: 2016-07-09
  Administered 2016-07-09: 3 mL via RESPIRATORY_TRACT

## 2016-07-09 MED ORDER — CHLORHEXIDINE GLUCONATE 0.12% ORAL RINSE (MEDLINE KIT)
15.0000 mL | Freq: Two times a day (BID) | OROMUCOSAL | Status: DC
  Administered 2016-07-09 – 2016-07-10 (×2): 15 mL via OROMUCOSAL

## 2016-07-09 MED ORDER — ASPIRIN 81 MG PO CHEW
81.0000 mg | CHEWABLE_TABLET | Freq: Every day | ORAL | Status: DC
  Administered 2016-07-10 – 2016-07-13 (×4): 81 mg
  Filled 2016-07-09 (×4): qty 1

## 2016-07-09 MED ORDER — DILTIAZEM HCL 100 MG IV SOLR
5.0000 mg/h | Freq: Once | INTRAVENOUS | Status: DC
  Filled 2016-07-09: qty 100

## 2016-07-09 NOTE — H&P (Signed)
PCCM History and Physical Note  Admission date: 07/09/2016 Referring provider: Dr. Rosalia Hammers, ER  CC: Short of breath  HPI: Hx from chart  66 yo male smoker brought to ER with shortness of breath.  He had elevated blood pressure. He required intubation.  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  He has been intubated several times in the past year.  He is reportedly on coumadin for A fib, but INR today 1.44.  He  has a past medical history of CAD (coronary artery disease); Chronic systolic heart failure (HCC); Hypertension; Stroke [facility]); and Substance abuse.  He  has a past surgical history that includes Joint replacement (L knee).  His family history includes Malignant hyperthermia in his mother.  He  reports that he has been smoking Cigarettes.  He has a 7.50 pack-year smoking history. He has never used smokeless tobacco. He reports that he drinks alcohol. He reports that he does not use drugs.  No Known Allergies  No current facility-administered medications on file prior to encounter.    Current Outpatient Prescriptions on File Prior to Encounter  Medication Sig  . Albuterol Sulfate 108 (90 Base) MCG/ACT AEPB Inhale 1 puff into the lungs every 6 (six) hours as needed. (Patient taking differently: Inhale 1 puff into the lungs every 6 (six) hours as needed (for wheezing or shortness of breath). )  . aspirin 81 MG tablet Take 1 tablet (81 mg total) by mouth daily.  Marland Kitchen atorvastatin (LIPITOR) 40 MG tablet Take 1 tablet (40 mg total) by mouth daily at 6 PM.  . furosemide (LASIX) 40 MG tablet Take 40 mg by mouth daily.  Marland Kitchen lisinopril (PRINIVIL,ZESTRIL) 5 MG tablet Take 5 mg by mouth daily.  Marland Kitchen oxyCODONE-acetaminophen (PERCOCET/ROXICET) 5-325 MG tablet Take 1 tablet by mouth every 4 (four) hours as needed for severe pain.  . Tiotropium Bromide Monohydrate (SPIRIVA RESPIMAT) 1.25 MCG/ACT AERS Inhale 2 puffs into the lungs daily.  Marland Kitchen warfarin (COUMADIN) 5 MG tablet  Take 5 mg by mouth daily.    ROS:  Unable to obtain  Vital signs: BP 94/76   Pulse 81   Resp 24   Ht 6\' 1"  (1.854 m)   Wt 131 lb 13.4 oz (59.8 kg)   SpO2 95%   BMI 17.39 kg/m   Intake/output: No intake/output data recorded.  General: sedated Neuro: RASS -3, moves extremities with stimulation Eyes: pupils reactive, arcus seniles ENT: ETT in place Cardiac: irregular, no murmur Chest: b/l crackles Abd: soft, non tender, + bowel sounds Ext: no edema, decrease muscle bulk Skin: scaling of lower legs   CMP Latest Ref Rng & Units 07/09/2016 01/30/2016 01/29/2016  Glucose 65 - 99 mg/dL 161(W) 85 960(A)  BUN 6 - 20 mg/dL 19 12 13   Creatinine 0.61 - 1.24 mg/dL 5.40(J) 8.11 9.14  Sodium 135 - 145 mmol/L 140 135 140  Potassium 3.5 - 5.1 mmol/L 3.9 3.9 3.2(L)  Chloride 101 - 111 mmol/L 104 104 109  CO2 22 - 32 mmol/L - 23 23  Calcium 8.9 - 10.3 mg/dL - 8.8(L) 8.8(L)  Total Protein 6.5 - 8.1 g/dL - - 6.2(L)  Total Bilirubin 0.3 - 1.2 mg/dL - - 0.5  Alkaline Phos 38 - 126 U/L - - 66  AST 15 - 41 U/L - - 28  ALT 17 - 63 U/L - - 21     CBC Latest Ref Rng & Units 07/09/2016 07/09/2016 01/29/2016  WBC 4.0 - 10.5 K/uL -  7.1 6.8  Hemoglobin 13.0 - 17.0 g/dL 40.916.7 81.114.8 91.414.6  Hematocrit 39.0 - 52.0 % 49.0 45.8 44.9  Platelets 150 - 400 K/uL - 169 162     ABG    Component Value Date/Time   PHART 7.289 (L) 07/09/2016 1904   PCO2ART 53.8 (H) 07/09/2016 1904   PO2ART 375.0 (H) 07/09/2016 1904   HCO3 25.8 07/09/2016 1904   TCO2 27 07/09/2016 1904   ACIDBASEDEF 2.0 07/09/2016 1904   O2SAT 100.0 07/09/2016 1904     CBG (last 3)   Recent Labs  07/09/16 1813  GLUCAP 117*     Imaging: Dg Chest Portable 1 View  Result Date: 07/09/2016 CLINICAL DATA:  ET tube placement EXAM: PORTABLE CHEST 1 VIEW COMPARISON:  07/09/2016 FINDINGS: Endotracheal tube with the tip 6.8 cm above the carina. Nasogastric tube coursing below the diaphragm. Bilateral diffuse interstitial thickening. No  pleural effusion or pneumothorax. Stable cardiomegaly. The osseous structures are unremarkable. IMPRESSION: 1. Endotracheal tube with the tip 6.8 cm above the carina. Electronically Signed   By: Elige KoHetal  Patel   On: 07/09/2016 18:40   Dg Chest Port 1 View  Result Date: 07/09/2016 CLINICAL DATA:  Restrained distress EXAM: PORTABLE CHEST 1 VIEW COMPARISON:  02/01/2016 FINDINGS: There is bilateral diffuse interstitial thickening. There is no focal consolidation, pleural effusion or pneumothorax. There is stable cardiomegaly. There is no acute osseous abnormality IMPRESSION: Findings concerning for mild pulmonary edema. Electronically Signed   By: Elige KoHetal  Patel   On: 07/09/2016 18:28     Studies:  Antibiotics:  Cultures: Sputum 3/01 >> Respiratory viral panel 3/01 >> Influenza 'PCR 3/01 >>  Lines/tubes: ETT 3/01 >>  Events: 3/01 Admit  Summary: 66 yo male smoker with acute respiratory failure with acute pulmonary edema in setting acute on chronic diastolic/systolic CHF with HTN and tachycardia.  He also has hx of COPD with emphysema, A fib, substance abuse, CVA.  Assessment/plan:  Acute respiratory failure. Hx of COPD with emphysema. Tobacco abuse. - full vent support - scheduled pulmicort, duoneb - f/u CXR, ABG - f/u cultures, respiratory viral panel, procalcitonin >> hold Abx for now  Acute on chronic combined CHF with HTN emergency. Hx of A fib, CAD, HTN, HLD, PAD. - heparin gtt per pharmacy - f/u cardiac enzymes - continue ASA, lipitor  CKD 3. - monitor renal fx  Substance abuse. - f/u UDS  Acute metabolic encephalopathy. - RASS goal 0 to -1  DVT prophylaxis - heparin gtt SUP - protonix Nutrition - tube feeds Goals of care - full code  CC time 42 minutes  Coralyn HellingVineet Jahzion Brogden, MD [facility] 07/09/2016, 9:07 PM Pager:  [phone] After 3pm call: [phone]

## 2016-07-09 NOTE — Progress Notes (Signed)
ANTICOAGULATION CONSULT NOTE - Initial Consult  Pharmacy Consult for Heparin Indication: atrial fibrillation  No Known Allergies  Patient Measurements: Height: _0  (185.4 cm) IBW/kg (Calculated) : 79.9 Heparin Dosing Weight: 60kg  Vital Signs: BP: 94/76 (03/01 2015) Pulse Rate: 81 (03/01 2015)  Labs:  Recent Labs  07/09/16 1824 07/09/16 1847  HGB 14.8 16.7  HCT 45.8 49.0  PLT 169  --   LABPROT 17.6*  --   INR 1.44  --   CREATININE  --  1.40*    CrCl cannot be calculated (Unknown ideal weight.).   Medical History: Past Medical History:  Diagnosis Date  . CAD (coronary artery disease)    nonobstructive  . Chronic systolic heart failure ([place])   . Hypertension   . Stroke ([place])   . Substance abuse     Medications:   (Not in a hospital admission) Scheduled:  . aspirin  324 mg Oral Once  . aspirin  81 mg Per Tube Daily  . [START ON 07/10/2016] atorvastatin  40 mg Per Tube q1800  . budesonide (PULMICORT) nebulizer solution  0.5 mg Nebulization BID  . chlorhexidine gluconate (MEDLINE KIT)  15 mL Mouth Rinse BID  . feeding supplement (PRO-STAT SUGAR FREE 64)  30 mL Per Tube BID  . feeding supplement (VITAL HIGH PROTEIN)  1,000 mL Per Tube Q24H  . ipratropium-albuterol  3 mL Nebulization Q6H  . [START ON 07/10/2016] mouth rinse  15 mL Mouth Rinse QID  . pantoprazole sodium  40 mg Per Tube Q24H   Infusions:  . sodium chloride    . propofol (DIPRIVAN) infusion      Assessment: Patient is a 66 yo male admitted with shortness of breath. He was intubated on admission. Per PTA medication list, he was on warfarin 35m PO daily for atrial fibrillation/history of stroke. INR was subtherapeutic at 1.44 on admission, last warfarin dose unknown.   Baseline hemoglobin and platelet count within normal limits. No notes of bleeding. Will give conservative bolus dose due to slightly elevated INR.   Goal of Therapy:  Heparin level 0.3-0.7 units/ml Monitor platelets by  anticoagulation protocol: Yes   Plan:  Give 3000 units bolus x 1 Start heparin infusion at 850 units/hr Check anti-Xa level in 6 hours and daily while on heparin Continue to monitor H&H and platelets  Follow up plans for oral anticoagulation when able  MDemetrius Charity PharmD Acute Care Pharmacy Resident  Pager: [phone]/05/2016

## 2016-07-09 NOTE — ED Provider Notes (Signed)
[facility] DEPT Provider Note   CSN: [id number] Arrival date & time: 07/09/16  1752     History   Chief Complaint Chief Complaint  Patient presents with  . Shortness of Breath    HPI [name] is a 66 y.o. male.  HPI Arrives stating he thinks he is going to die States he used cocaine yesterday and then today developed severe sob No cp States no significant cough, fever States hx of copd and maybe feels like this Also has chf Recent PTX, still has stiches Hx limited as pt is peri arrest - diaphoretic and passing out  Past Medical History:  Diagnosis Date  . CAD (coronary artery disease)    nonobstructive  . Chronic systolic heart failure ([place])   . Hypertension   . Stroke ([place])   . Substance abuse     Patient Active Problem List   Diagnosis Date Noted  . Respiratory failure ([place]) 07/09/2016  . Pneumothorax on right   . 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 COPD exacerbation ([place])   . History of CVA (cerebrovascular accident)   . Acute on chronic systolic heart failure ([place]) 07/08/2011  . Hypokalemia 07/07/2011  . Flash pulmonary edema ([employer]) 07/06/2011  . 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 Abnormal EKG 07/06/2011  . Chronic systolic congestive heart failure ([place]) 05/05/2011  . Acute respiratory failure with hypoxia ([place]) 05/03/2011  . SUBSTANCE ABUSE, MULTIPLE 03/25/2010  . Essential hypertension 03/25/2010    Past Surgical History:  Procedure Laterality Date  . JOINT REPLACEMENT  L knee       Home Medications    Prior to Admission medications   Medication Sig Start Date End Date Taking? Authorizing Provider  carvedilol (COREG) 6.25 MG tablet Take 3.125 mg by mouth 2 (two) times daily.   Yes Historical Provider, MD  Albuterol Sulfate 108 (90 Base) MCG/ACT AEPB Inhale 1 puff into the lungs every 6 (six) hours as needed. Patient taking differently: Inhale 1 puff into the lungs every 6 (six) hours as needed (for  wheezing or shortness of breath).  02/01/16   Florinda Marker, MD  aspirin 81 MG tablet Take 1 tablet (81 mg total) by mouth daily. 02/01/16   Florinda Marker, MD  atorvastatin (LIPITOR) 40 MG tablet Take 1 tablet (40 mg total) by mouth daily at 6 PM. 02/01/16   Alexa Angela Burke, MD  furosemide (LASIX) 40 MG tablet Take 40 mg by mouth daily. 12/30/15   Historical Provider, MD  lisinopril (PRINIVIL,ZESTRIL) 5 MG tablet Take 5 mg by mouth daily. 12/30/15   Historical Provider, MD  oxyCODONE-acetaminophen (PERCOCET/ROXICET) 5-325 MG tablet Take 1 tablet by mouth every 4 (four) hours as needed for severe pain. 02/01/16   Florinda Marker, MD  Tiotropium Bromide Monohydrate (SPIRIVA RESPIMAT) 1.25 MCG/ACT AERS Inhale 2 puffs into the lungs daily. 02/01/16   Florinda Marker, MD  warfarin (COUMADIN) 5 MG tablet Take 5 mg by mouth daily. 12/02/15   Historical Provider, MD    Family History Family History  Problem Relation Age of Onset  . Malignant hyperthermia Mother     Social History Social History  Substance Use Topics  . Smoking status: Current Every Day Smoker    Packs/day: 0.25    Years: 30.00    Types: Cigarettes  . Smokeless tobacco: Never Used  . Alcohol use Yes     Allergies   Patient has no known allergies.   Review of Systems Review of Systems  Unable  to perform ROS: Acuity of condition     Physical Exam Updated Vital Signs BP 104/80   Pulse 72   Temp 98.8 F (37.1 C) (Oral)   Resp (!) 23   Ht 6' (1.829 m)   Wt 63.7 kg   SpO2 98%   BMI 19.05 kg/m   Physical Exam  Constitutional: He appears well-developed and well-nourished. He appears distressed.  HENT:  Head: Normocephalic and atraumatic.  Eyes: Conjunctivae are normal. Pupils are equal, round, and reactive to light. Right eye exhibits no discharge. Left eye exhibits no discharge.  Neck: Normal range of motion. Neck supple.  Cardiovascular: Regular rhythm.   No murmur heard. Tachycardic to 130s  Pulmonary/Chest: He is in  respiratory distress. He has wheezes. He has no rales. He exhibits no tenderness.  Abdominal: Soft. Bowel sounds are normal. He exhibits no distension and no mass. There is no tenderness. There is no rebound and no guarding.  Musculoskeletal: He exhibits no edema.  Neurological:  Pt intermittently closing his eyes and eyes are rolling to his back  Skin: Skin is warm. He is not diaphoretic.  Nursing note and vitals reviewed.    ED Treatments / Results  Labs (all labs ordered are listed, but only abnormal results are displayed) Labs Reviewed  PROTIME-INR - Abnormal; Notable for the following:       Result Value   Prothrombin Time 17.6 (*)    All other components within normal limits  BRAIN NATRIURETIC PEPTIDE - Abnormal; Notable for the following:    B Natriuretic Peptide 1,627.3 (*)    All other components within normal limits  PHOSPHORUS - Abnormal; Notable for the following:    Phosphorus 2.4 (*)    All other components within normal limits  TROPONIN I - Abnormal; Notable for the following:    Troponin I 0.06 (*)    All other components within normal limits  I-STAT VENOUS BLOOD GAS, ED - Abnormal; Notable for the following:    pCO2, Ven 62.3 (*)    pO2, Ven 31.0 (*)    All other components within normal limits  I-STAT CHEM 8, ED - Abnormal; Notable for the following:    Creatinine, Ser 1.40 (*)    Glucose, Bld 144 (*)    Calcium, Ion 1.12 (*)    All other components within normal limits  CBG MONITORING, ED - Abnormal; Notable for the following:    Glucose-Capillary 117 (*)    All other components within normal limits  I-STAT ARTERIAL BLOOD GAS, ED - Abnormal; Notable for the following:    pH, Arterial 7.289 (*)    pCO2 arterial 53.8 (*)    pO2, Arterial 375.0 (*)    All other components within normal limits  RESPIRATORY PANEL BY PCR  CULTURE, RESPIRATORY (NON-EXPECTORATED)  MRSA PCR SCREENING  CBC WITH DIFFERENTIAL/PLATELET  TRIGLYCERIDES  MAGNESIUM  PROCALCITONIN    MAGNESIUM  PHOSPHORUS  BLOOD GAS, ARTERIAL  BASIC METABOLIC PANEL  CBC  RAPID URINE DRUG SCREEN, HOSP PERFORMED  INFLUENZA PANEL BY PCR (TYPE A & B)  PROCALCITONIN  HEPARIN LEVEL (UNFRACTIONATED)  TROPONIN I  TROPONIN I  MAGNESIUM  PHOSPHORUS  I-STAT TROPOININ, ED    EKG  EKG Interpretation  Date/Time:  Thursday July 09 2016 20:02:50 EST Ventricular Rate:  96 PR Interval:    QRS Duration: 166 QT Interval:  440 QTC Calculation: 557 R Axis:   115 Text Interpretation:  Sinus rhythm Probable left atrial enlargement RBBB and LPFB Confirmed by RAY MD,  DANIELLE [phone]) on 07/09/2016 8:37:22 PM       Radiology Dg Chest Portable 1 View  Result Date: 07/09/2016 CLINICAL DATA:  ET tube placement EXAM: PORTABLE CHEST 1 VIEW COMPARISON:  07/09/2016 FINDINGS: Endotracheal tube with the tip 6.8 cm above the carina. Nasogastric tube coursing below the diaphragm. Bilateral diffuse interstitial thickening. No pleural effusion or pneumothorax. Stable cardiomegaly. The osseous structures are unremarkable. IMPRESSION: 1. Endotracheal tube with the tip 6.8 cm above the carina. Electronically Signed   By: Kathreen Devoid   On: 07/09/2016 18:40   Dg Chest Port 1 View  Result Date: 07/09/2016 CLINICAL DATA:  Restrained distress EXAM: PORTABLE CHEST 1 VIEW COMPARISON:  02/01/2016 FINDINGS: There is bilateral diffuse interstitial thickening. There is no focal consolidation, pleural effusion or pneumothorax. There is stable cardiomegaly. There is no acute osseous abnormality IMPRESSION: Findings concerning for mild pulmonary edema. Electronically Signed   By: Kathreen Devoid   On: 07/09/2016 18:28    Procedures Procedure Name: Intubation Date/Time: 07/09/2016 6:24 PM Performed by: Karma Greaser Pre-anesthesia Checklist: Patient identified Oxygen Delivery Method: Ambu bag Preoxygenation: Pre-oxygenation with 100% oxygen Intubation Type: Rapid sequence Ventilation: Mask ventilation without  difficulty Laryngoscope Size: 4 Grade View: Grade I Tube type: Subglottic suction tube Tube size: 8.0 mm Number of attempts: 1 Airway Equipment and Method: Video-laryngoscopy Placement Confirmation: ETT inserted through vocal cords under direct vision Secured at: 21 cm Tube secured with: ETT holder       (including critical care time)  Medications Ordered in ED Medications  aspirin chewable tablet 324 mg (324 mg Oral Not Given 07/09/16 1852)  pantoprazole sodium (PROTONIX) 40 mg/20 mL oral suspension 40 mg (40 mg Per Tube Given 07/09/16 2239)  fentaNYL (SUBLIMAZE) injection 100 mcg (100 mcg Intravenous Given 07/09/16 2216)  propofol (DIPRIVAN) 1000 MG/100ML infusion (25 mcg/kg/min  59.8 kg Intravenous Rate/Dose Change 07/09/16 2215)  midazolam (VERSED) injection 2 mg (2 mg Intravenous Given 07/09/16 2124)  feeding supplement (VITAL HIGH PROTEIN) liquid 1,000 mL (1,000 mLs Per Tube Given 07/09/16 2239)  feeding supplement (PRO-STAT SUGAR FREE 64) liquid 30 mL (30 mLs Per Tube Given 07/09/16 2239)  ipratropium-albuterol (DUONEB) 0.5-2.5 (3) MG/3ML nebulizer solution 3 mL (3 mLs Nebulization Given 07/09/16 2112)  albuterol (PROVENTIL) (2.5 MG/3ML) 0.083% nebulizer solution 2.5 mg (not administered)  0.9 %  sodium chloride infusion (not administered)  budesonide (PULMICORT) nebulizer solution 0.5 mg (0.5 mg Nebulization Not Given 07/09/16 2130)  aspirin chewable tablet 81 mg (not administered)  atorvastatin (LIPITOR) tablet 40 mg (not administered)  heparin ADULT infusion 100 units/mL (25000 units/211m sodium chloride 0.45%) (850 Units/hr Intravenous New Bag/Given 07/09/16 2207)  chlorhexidine gluconate (MEDLINE KIT) (PERIDEX) 0.12 % solution 15 mL (15 mLs Mouth Rinse Given 07/09/16 2200)  MEDLINE mouth rinse (not administered)  methylPREDNISolone sodium succinate (SOLU-MEDROL) 125 mg/2 mL injection 125 mg (125 mg Intravenous Given 07/09/16 1902)  etomidate (AMIDATE) injection (18 mg Intravenous Given  07/09/16 1818)  succinylcholine (ANECTINE) injection (90 mg Intravenous Given 07/09/16 1818)  ipratropium-albuterol (DUONEB) 0.5-2.5 (3) MG/3ML nebulizer solution 3 mL (3 mLs Nebulization Given 07/09/16 1851)  heparin bolus via infusion 3,000 Units (3,000 Units Intravenous Bolus from Bag 07/09/16 2206)     Initial Impression / Assessment and Plan / ED Course  I have reviewed the triage vital signs and the nursing notes.  Pertinent labs & imaging results that were available during my care of the patient were reviewed by me and considered in my medical decision making (see chart for  details).    Concern pt may have decompensated CHF from recent cocaine use given agitation, tachycardia, with known EF of 25% EKG concerning given STE but no CP, suspect demand ischemia Pt deteriorated shortly after arrival, peri-arrest during my eval and had to be emergently intubated BNP highly elevated - likely from HTN emergency from cocaine - benzos given and BP improved Initial trop negative Labs otherwise reviewed and pH low,, co2 high Sx not consistent with infection, cxr negative for such as well Doubt PE - atypical hx Will admit pt to MICU  Final Clinical Impressions(s) / ED Diagnoses   Final diagnoses:  SOB (shortness of breath)  Respiratory failure [facility])    New Prescriptions Current Discharge Medication List       Karma Greaser, MD 07/10/16 2438    Pattricia Boss, MD 07/19/16 1936

## 2016-07-09 NOTE — ED Triage Notes (Signed)
Pt brought in by [facility]. Pt c/o SOB starting at 7am. Pt alert and oriented in visible distress. Hx of CHF and afib on coumadin.

## 2016-07-09 NOTE — Progress Notes (Signed)
Patient transported from ED to 4N17 without any complications.

## 2016-07-09 NOTE — ED Notes (Signed)
Venous blood gas result shown to Dr. Rosalia Hammersay

## 2016-07-10 ENCOUNTER — Inpatient Hospital Stay (HOSPITAL_COMMUNITY): Payer: Medicare Other

## 2016-07-10 DIAGNOSIS — J438 Other emphysema: Secondary | ICD-10-CM

## 2016-07-10 DIAGNOSIS — J810 Acute pulmonary edema: Secondary | ICD-10-CM

## 2016-07-10 DIAGNOSIS — G9340 Encephalopathy, unspecified: Secondary | ICD-10-CM

## 2016-07-10 LAB — MAGNESIUM: MAGNESIUM: 2.1 mg/dL (ref 1.7–2.4)

## 2016-07-10 LAB — BLOOD GAS, ARTERIAL
ACID-BASE DEFICIT: 4.2 mmol/L — AB (ref 0.0–2.0)
Bicarbonate: 18.5 mmol/L — ABNORMAL LOW (ref 20.0–28.0)
Drawn by: 311011
FIO2: 40
MECHVT: 640 mL
O2 Saturation: 98.7 %
PEEP/CPAP: 5 cmH2O
Patient temperature: 98.6
RATE: 24 resp/min
pCO2 arterial: 24.1 mmHg — ABNORMAL LOW (ref 32.0–48.0)
pH, Arterial: 7.498 — ABNORMAL HIGH (ref 7.350–7.450)
pO2, Arterial: 126 mmHg — ABNORMAL HIGH (ref 83.0–108.0)

## 2016-07-10 LAB — HEPARIN LEVEL (UNFRACTIONATED)
Heparin Unfractionated: 0.3 IU/mL (ref 0.30–0.70)
Heparin Unfractionated: 0.26 IU/mL — ABNORMAL LOW (ref 0.30–0.70)
Heparin Unfractionated: 0.26 IU/mL — ABNORMAL LOW (ref 0.30–0.70)

## 2016-07-10 LAB — RESPIRATORY PANEL BY PCR
Adenovirus: NOT DETECTED
Bordetella pertussis: NOT DETECTED
CORONAVIRUS OC43-RVPPCR: NOT DETECTED
Chlamydophila pneumoniae: NOT DETECTED
Coronavirus 229E: NOT DETECTED
Coronavirus HKU1: NOT DETECTED
Coronavirus NL63: NOT DETECTED
INFLUENZA A-RVPPCR: NOT DETECTED
INFLUENZA B-RVPPCR: NOT DETECTED
METAPNEUMOVIRUS-RVPPCR: NOT DETECTED
Mycoplasma pneumoniae: NOT DETECTED
PARAINFLUENZA VIRUS 1-RVPPCR: NOT DETECTED
PARAINFLUENZA VIRUS 2-RVPPCR: NOT DETECTED
PARAINFLUENZA VIRUS 4-RVPPCR: NOT DETECTED
Parainfluenza Virus 3: NOT DETECTED
RESPIRATORY SYNCYTIAL VIRUS-RVPPCR: NOT DETECTED
Rhinovirus / Enterovirus: NOT DETECTED

## 2016-07-10 LAB — RAPID URINE DRUG SCREEN, HOSP PERFORMED
Amphetamines: NOT DETECTED
BARBITURATES: NOT DETECTED
BENZODIAZEPINES: POSITIVE — AB
COCAINE: POSITIVE — AB
Opiates: NOT DETECTED
Tetrahydrocannabinol: NOT DETECTED

## 2016-07-10 LAB — CBC
HEMATOCRIT: 44.6 % (ref 39.0–52.0)
Hemoglobin: 14.5 g/dL (ref 13.0–17.0)
MCH: 27.9 pg (ref 26.0–34.0)
MCHC: 32.5 g/dL (ref 30.0–36.0)
MCV: 85.9 fL (ref 78.0–100.0)
PLATELETS: 159 10*3/uL (ref 150–400)
RBC: 5.19 MIL/uL (ref 4.22–5.81)
RDW: 13.6 % (ref 11.5–15.5)
WBC: 5.8 10*3/uL (ref 4.0–10.5)

## 2016-07-10 LAB — INFLUENZA PANEL BY PCR (TYPE A & B)
Influenza A By PCR: NEGATIVE
Influenza B By PCR: NEGATIVE

## 2016-07-10 LAB — BASIC METABOLIC PANEL
Anion gap: 12 (ref 5–15)
BUN: 15 mg/dL (ref 6–20)
CO2: 20 mmol/L — ABNORMAL LOW (ref 22–32)
Calcium: 9.1 mg/dL (ref 8.9–10.3)
Chloride: 106 mmol/L (ref 101–111)
Creatinine, Ser: 1.44 mg/dL — ABNORMAL HIGH (ref 0.61–1.24)
GFR calc Af Amer: 58 mL/min — ABNORMAL LOW (ref >60)
GFR, EST NON AFRICAN AMERICAN: 50 mL/min — AB (ref >60)
Glucose, Bld: 138 mg/dL — ABNORMAL HIGH (ref 65–99)
POTASSIUM: 3.8 mmol/L (ref 3.5–5.1)
Sodium: 138 mmol/L (ref 135–145)

## 2016-07-10 LAB — GLUCOSE, CAPILLARY
GLUCOSE-CAPILLARY: 139 mg/dL — AB (ref 65–99)
GLUCOSE-CAPILLARY: 113 mg/dL — AB (ref 65–99)
Glucose-Capillary: 113 mg/dL — ABNORMAL HIGH (ref 65–99)
Glucose-Capillary: 124 mg/dL — ABNORMAL HIGH (ref 65–99)

## 2016-07-10 LAB — MRSA PCR SCREENING: MRSA by PCR: NEGATIVE

## 2016-07-10 LAB — PHOSPHORUS: Phosphorus: 2.5 mg/dL (ref 2.5–4.6)

## 2016-07-10 LAB — TROPONIN I
TROPONIN I: 0.03 ng/mL — AB (ref <0.03)
Troponin I: 0.04 ng/mL (ref <0.03)

## 2016-07-10 LAB — PROCALCITONIN: Procalcitonin: 0.22 ng/mL

## 2016-07-10 MED ORDER — ORAL CARE MOUTH RINSE: 15.0000 mL | Freq: Two times a day (BID) | OROMUCOSAL | Status: DC

## 2016-07-10 MED ORDER — HEPARIN (PORCINE) IN NACL 100-0.45 UNIT/ML-% IJ SOLN
1150.0000 [IU]/h | INTRAMUSCULAR | Status: DC
  Administered 2016-07-10: 1000 [IU]/h via INTRAVENOUS
  Administered 2016-07-11: 1150 [IU]/h via INTRAVENOUS
  Filled 2016-07-10 (×2): qty 250

## 2016-07-10 MED ORDER — OXYCODONE-ACETAMINOPHEN 5-325 MG PO TABS: 1.0000 | ORAL_TABLET | Freq: Four times a day (QID) | ORAL | Status: DC | PRN

## 2016-07-10 MED ORDER — FUROSEMIDE 10 MG/ML IJ SOLN
40.0000 mg | Freq: Four times a day (QID) | INTRAMUSCULAR | Status: AC
  Administered 2016-07-10 (×3): 40 mg via INTRAVENOUS
  Filled 2016-07-10 (×3): qty 4

## 2016-07-10 NOTE — Progress Notes (Signed)
ANTICOAGULATION CONSULT NOTE - Follow Up Consult  Pharmacy Consult for Heparin Indication: atrial fibrillation  No Known Allergies  Patient Measurements: Height: 6' (182.9 cm) Weight: 140 lb 6.9 oz (63.7 kg) IBW/kg (Calculated) : 77.6  Vital Signs: Temp: 98 F (36.7 C) (03/02 2000) Temp Source: Oral (03/02 2000) BP: 120/80 (03/02 2000) Pulse Rate: 84 (03/02 2000)  Labs:  Recent Labs  07/09/16 1824 07/09/16 1847 07/09/16 2047 07/10/16 0229 07/10/16 0828 07/10/16 1210 07/10/16 2035  HGB 14.8 16.7  --  14.5  --   --   --   HCT 45.8 49.0  --  44.6  --   --   --   PLT 169  --   --  159  --   --   --   LABPROT 17.6*  --   --   --   --   --   --   INR 1.44  --   --   --   --   --   --   HEPARINUNFRC  --   --   --  0.30  --  0.26* 0.26*  CREATININE  --  1.40*  --  1.44*  --   --   --   TROPONINI  --   --  0.06* 0.04* 0.03*  --   --     Estimated Creatinine Clearance: 47.3 mL/min (by C-G formula based on SCr of 1.44 mg/dL (H)).   Assessment: Patient is a 66 yo male admitted with shortness of breath. He was intubated on admission. Per PTA medication list, he was on warfarin 5mg  PO daily for atrial fibrillation/history of stroke. INR was subtherapeutic at 1.44 on admission, last warfarin dose unknown.  Heparin level slightly subtherapeutic despite rate increase earlier today.  No bleeding or complications noted.   Goal of Therapy:  Heparin level 0.3-0.7 units/ml Monitor platelets by anticoagulation protocol: Yes   Plan:  Increase IV heparin to 1150 units/hr F/u AM heparin level  Bayard HuggerMei Kariyah Baugh, PharmD, BCPS  Clinical Pharmacist  Pager: [phone]    07/10/2016 9:20 PM

## 2016-07-10 NOTE — Progress Notes (Signed)
ANTICOAGULATION CONSULT NOTE - Follow Up Consult  Pharmacy Consult for Heparin Indication: atrial fibrillation  No Known Allergies  Patient Measurements: Height: 6' (182.9 cm) Weight: 140 lb 6.9 oz (63.7 kg) IBW/kg (Calculated) : 77.6  Vital Signs: Temp: 97.8 F (36.6 C) (03/02 0400) Temp Source: Oral (03/02 0400) BP: 117/89 (03/02 0600) Pulse Rate: 86 (03/02 0400)  Labs:  Recent Labs  07/09/16 1824 07/09/16 1847 07/09/16 2047 07/10/16 0229  HGB 14.8 16.7  --  14.5  HCT 45.8 49.0  --  44.6  PLT 169  --   --  159  LABPROT 17.6*  --   --   --   INR 1.44  --   --   --   HEPARINUNFRC  --   --   --  0.30  CREATININE  --  1.40*  --  1.44*  TROPONINI  --   --  0.06* 0.04*    Estimated Creatinine Clearance: 47.3 mL/min (by C-G formula based on SCr of 1.44 mg/dL (H)).   Assessment: Patient is a 66 yo male admitted with shortness of breath. He was intubated on admission. Per PTA medication list, he was on warfarin 5mg  PO daily for atrial fibrillation/history of stroke. INR was subtherapeutic at 1.44 on admission, last warfarin dose unknown.  3/2 AM: Heparin level on low end of therapeutic range  Goal of Therapy:  Heparin level 0.3-0.7 units/ml Monitor platelets by anticoagulation protocol: Yes   Plan:  -Inc heparin to 900 units/hr -1300 confirmatory HL   Abran DukeLedford, Roque Schill 07/10/2016,6:15 AM

## 2016-07-10 NOTE — Progress Notes (Signed)
ANTICOAGULATION CONSULT NOTE - Follow Up Consult  Pharmacy Consult for Heparin Indication: atrial fibrillation  No Known Allergies  Patient Measurements: Height: 6' (182.9 cm) Weight: 140 lb 6.9 oz (63.7 kg) IBW/kg (Calculated) : 77.6  Vital Signs: Temp: 98.3 F (36.8 C) (03/02 1226) Temp Source: Oral (03/02 1226) BP: 96/68 (03/02 1400) Pulse Rate: 81 (03/02 1300)  Labs:  Recent Labs  07/09/16 1824 07/09/16 1847 07/09/16 2047 07/10/16 0229 07/10/16 0828 07/10/16 1210  HGB 14.8 16.7  --  14.5  --   --   HCT 45.8 49.0  --  44.6  --   --   PLT 169  --   --  159  --   --   LABPROT 17.6*  --   --   --   --   --   INR 1.44  --   --   --   --   --   HEPARINUNFRC  --   --   --  0.30  --  0.26*  CREATININE  --  1.40*  --  1.44*  --   --   TROPONINI  --   --  0.06* 0.04* 0.03*  --     Estimated Creatinine Clearance: 47.3 mL/min (by C-G formula based on SCr of 1.44 mg/dL (H)).   Assessment: Patient is a 66 yo male admitted with shortness of breath. He was intubated on admission. Per PTA medication list, he was on warfarin 5mg  PO daily for atrial fibrillation/history of stroke. INR was subtherapeutic at 1.44 on admission, last warfarin dose unknown.  Heparin level slightly subtherapeutic despite rate reduction earlier today.  No bleeding or complications noted.  Goal of Therapy:  Heparin level 0.3-0.7 units/ml Monitor platelets by anticoagulation protocol: Yes   Plan:  Increase IV heparin to 1000 units/hr Recheck heparin level in 6 hrs. Daily heparin level and CBC. Resume Coumadin soon?   Tad MooreJessica Siri Buege, Pharm D, BCPS  Clinical Pharmacist Pager [phone]  07/10/2016 2:46 PM

## 2016-07-10 NOTE — Progress Notes (Signed)
PCCM History and Physical Note  Admission date: 07/09/2016 Referring provider: Dr. Rosalia Hammersay, ER  CC: Short of breath  HPI: Hx from chart  66 yo male smoker brought to ER with shortness of breath.  He had elevated blood pressure. He required intubation.  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  He has been intubated several times in the past year.  He is reportedly on coumadin for A fib, but INR today 1.44.  Subjective:   Vital signs: BP 116/78   Pulse 64   Temp 98.2 F (36.8 C) (Oral)   Resp (!) 24   Ht 6' (1.829 m)   Wt 63.7 kg (140 lb 6.9 oz)   SpO2 97%   BMI 19.05 kg/m   Intake/output: I/O last 3 completed shifts: In: 568.5 [I.V.:274.5; NG/GT:294] Out: 400 [Urine:400]  General: sedated Neuro: RASS -3, moves extremities with stimulation Eyes: pupils reactive, arcus seniles ENT: ETT in place Cardiac: irregular, no murmur Chest: b/l crackles Abd: soft, non tender, + bowel sounds Ext: no edema, decrease muscle bulk Skin: scaling of lower legs   CMP Latest Ref Rng & Units 07/10/2016 07/09/2016 01/30/2016  Glucose 65 - 99 mg/dL 725(D138(H) 664(Q144(H) 85  BUN 6 - 20 mg/dL 15 19 12   Creatinine 0.61 - 1.24 mg/dL 0.34(V1.44(H) 4.25(Z1.40(H) 5.631.05  Sodium 135 - 145 mmol/L 138 140 135  Potassium 3.5 - 5.1 mmol/L 3.8 3.9 3.9  Chloride 101 - 111 mmol/L 106 104 104  CO2 22 - 32 mmol/L 20(L) - 23  Calcium 8.9 - 10.3 mg/dL 9.1 - 8.8(L)  Total Protein 6.5 - 8.1 g/dL - - -  Total Bilirubin 0.3 - 1.2 mg/dL - - -  Alkaline Phos 38 - 126 U/L - - -  AST 15 - 41 U/L - - -  ALT 17 - 63 U/L - - -   CBC Latest Ref Rng & Units 07/10/2016 07/09/2016 07/09/2016  WBC 4.0 - 10.5 K/uL 5.8 - 7.1  Hemoglobin 13.0 - 17.0 g/dL 87.514.5 64.316.7 32.914.8  Hematocrit 39.0 - 52.0 % 44.6 49.0 45.8  Platelets 150 - 400 K/uL 159 - 169   ABG    Component Value Date/Time   PHART 7.498 (H) 07/10/2016 0237   PCO2ART 24.1 (L) 07/10/2016 0237   PO2ART 126 (H) 07/10/2016 0237   HCO3 18.5 (L) 07/10/2016 0237    TCO2 27 07/09/2016 1904   ACIDBASEDEF 4.2 (H) 07/10/2016 0237   O2SAT 98.7 07/10/2016 0237   CBG (last 3)   Recent Labs  07/10/16 0101 07/10/16 0429 07/10/16 0754  GLUCAP 139* 113* 113*   Imaging: Dg Chest Port 1 View  Result Date: 07/10/2016 CLINICAL DATA:  Respiratory failure. EXAM: PORTABLE CHEST 1 VIEW COMPARISON:  07/09/2016. FINDINGS: Endotracheal tube and NG tube in stable position. Cardiomegaly. Bilateral pulmonary interstitial prominence with small bilateral pleural effusions again noted. Slight improvement from prior exam. COPD. No acute bony abnormality . IMPRESSION: 1. Lines and tubes in stable position. 2. Cardiomegaly with bilateral from interstitial prominence and small pleural effusions consistent with mild CHF again noted. Slight improvement from prior exam . Mild pneumonitis cannot be excluded. 3. COPD. Electronically Signed   By: Maisie Fushomas  Register   On: 07/10/2016 07:34   Dg Chest Portable 1 View  Result Date: 07/09/2016 CLINICAL DATA:  ET tube placement EXAM: PORTABLE CHEST 1 VIEW COMPARISON:  07/09/2016 FINDINGS: Endotracheal tube with the tip 6.8 cm above the carina. Nasogastric tube coursing below the diaphragm. Bilateral diffuse interstitial  thickening. No pleural effusion or pneumothorax. Stable cardiomegaly. The osseous structures are unremarkable. IMPRESSION: 1. Endotracheal tube with the tip 6.8 cm above the carina. Electronically Signed   By: Elige Ko   On: 07/09/2016 18:40   Dg Chest Port 1 View  Result Date: 07/09/2016 CLINICAL DATA:  Restrained distress EXAM: PORTABLE CHEST 1 VIEW COMPARISON:  02/01/2016 FINDINGS: There is bilateral diffuse interstitial thickening. There is no focal consolidation, pleural effusion or pneumothorax. There is stable cardiomegaly. There is no acute osseous abnormality IMPRESSION: Findings concerning for mild pulmonary edema. Electronically Signed   By: Elige Ko   On: 07/09/2016 18:28    Studies:   Antibiotics: None  Cultures: Sputum 3/01 >> Respiratory viral panel 3/01 >> Influenza 'PCR 3/01 >>  Lines/tubes: ETT 3/01 >>  Events: 3/01 Admit  Summary: 66 yo male smoker with acute respiratory failure with acute pulmonary edema in setting acute on chronic diastolic/systolic CHF with HTN and tachycardia.  He also has hx of COPD with emphysema, A fib, substance abuse, CVA.  Assessment/plan:  Acute respiratory failure. Hx of COPD with emphysema. Tobacco abuse. - Hold sedation for potential extubation today - Active diureses today - Scheduled pulmicort, duoneb - F/u CXR, ABG while intubated - F/u cultures, respiratory viral panel, PCT low, no abx for now  Acute on chronic combined CHF with HTN emergency. Hx of A fib, CAD, HTN, HLD, PAD. - Heparin gtt per pharmacy for a-fib, unsure if compliant with coumadin - F/u cardiac enzymes - Continue ASA, lipitor  CKD 3. - Monitor renal fx - Lasix 40 mg IV q6 x3 doses - BMET in AM - Replace electrolytes as indicated  Substance abuse. - Cocaine positive - Benzo positive but after being given benzos for intubation  Acute metabolic encephalopathy. - RASS goal 0 to -1 - D/C propofol for extubation - Monitor for withdrawal.  DVT prophylaxis - heparin gtt SUP - protonix Nutrition - tube feeds Goals of care - full code  The patient is critically ill with multiple organ systems failure and requires high complexity decision making for assessment and support, frequent evaluation and titration of therapies, application of advanced monitoring technologies and extensive interpretation of multiple databases.   Critical Care Time devoted to patient care services described in this note is  35  Minutes. This time reflects time of care of this signee Dr Koren Bound. This critical care time does not reflect procedure time, or teaching time or supervisory time of PA/NP/Med student/Med Resident etc but could involve care  discussion time.  Alyson Reedy, M.D. [facility] Pulmonary/Critical Care Medicine. Pager: [phone]. After hours pager: [phone].

## 2016-07-10 NOTE — Progress Notes (Signed)
250mL of Fentanyl wasted down the sink with Isaiah SergeSondra, Charity fundraiserN.

## 2016-07-10 NOTE — Progress Notes (Signed)
Pt extubated to 4L [place] per MD order. Pt tol well. Posotve cuff leak prior to extubation. No stridor or distress noted post extubation. VS stwable. Will cont to monitor

## 2016-07-11 LAB — PHOSPHORUS: Phosphorus: 2.8 mg/dL (ref 2.5–4.6)

## 2016-07-11 LAB — CBC
HCT: 41 % (ref 39.0–52.0)
Hemoglobin: 13.2 g/dL (ref 13.0–17.0)
MCH: 27.4 pg (ref 26.0–34.0)
MCHC: 32.2 g/dL (ref 30.0–36.0)
MCV: 85.1 fL (ref 78.0–100.0)
PLATELETS: 166 10*3/uL (ref 150–400)
RBC: 4.82 MIL/uL (ref 4.22–5.81)
RDW: 13.7 % (ref 11.5–15.5)
WBC: 8.5 10*3/uL (ref 4.0–10.5)

## 2016-07-11 LAB — BASIC METABOLIC PANEL
Anion gap: 11 (ref 5–15)
BUN: 21 mg/dL — AB (ref 6–20)
CHLORIDE: 100 mmol/L — AB (ref 101–111)
CO2: 28 mmol/L (ref 22–32)
CREATININE: 1.19 mg/dL (ref 0.61–1.24)
Calcium: 8.7 mg/dL — ABNORMAL LOW (ref 8.9–10.3)
GFR calc Af Amer: >60 mL/min (ref >60)
Glucose, Bld: 90 mg/dL (ref 65–99)
Potassium: 3.1 mmol/L — ABNORMAL LOW (ref 3.5–5.1)
Sodium: 139 mmol/L (ref 135–145)

## 2016-07-11 LAB — HEPARIN LEVEL (UNFRACTIONATED): Heparin Unfractionated: 0.34 IU/mL (ref 0.30–0.70)

## 2016-07-11 LAB — PROCALCITONIN: Procalcitonin: 0.25 ng/mL

## 2016-07-11 LAB — MAGNESIUM: Magnesium: 1.9 mg/dL (ref 1.7–2.4)

## 2016-07-11 MED ORDER — POTASSIUM CHLORIDE CRYS ER 20 MEQ PO TBCR
40.0000 meq | EXTENDED_RELEASE_TABLET | Freq: Every day | ORAL | Status: DC
  Administered 2016-07-11 – 2016-07-12 (×2): 40 meq via ORAL
  Filled 2016-07-11 (×2): qty 2

## 2016-07-11 MED ORDER — POTASSIUM CHLORIDE CRYS ER 20 MEQ PO TBCR
30.0000 meq | EXTENDED_RELEASE_TABLET | ORAL | Status: DC
  Administered 2016-07-11: 30 meq via ORAL
  Filled 2016-07-11: qty 1

## 2016-07-11 MED ORDER — FUROSEMIDE 40 MG PO TABS
40.0000 mg | ORAL_TABLET | Freq: Every day | ORAL | Status: DC
  Administered 2016-07-11 – 2016-07-12 (×2): 40 mg via ORAL
  Filled 2016-07-11 (×2): qty 1

## 2016-07-11 MED ORDER — WARFARIN SODIUM 7.5 MG PO TABS
7.5000 mg | ORAL_TABLET | Freq: Once | ORAL | Status: AC
  Administered 2016-07-11: 7.5 mg via ORAL
  Filled 2016-07-11: qty 1

## 2016-07-11 MED ORDER — TIOTROPIUM BROMIDE MONOHYDRATE 18 MCG IN CAPS
18.0000 ug | ORAL_CAPSULE | Freq: Every day | RESPIRATORY_TRACT | Status: DC
  Administered 2016-07-12 – 2016-07-13 (×2): 18 ug via RESPIRATORY_TRACT
  Filled 2016-07-11 (×2): qty 5

## 2016-07-11 MED ORDER — ATORVASTATIN CALCIUM 40 MG PO TABS
40.0000 mg | ORAL_TABLET | Freq: Every day | ORAL | Status: DC
  Administered 2016-07-11 – 2016-07-12 (×2): 40 mg via ORAL
  Filled 2016-07-11 (×2): qty 1

## 2016-07-11 MED ORDER — WARFARIN - PHARMACIST DOSING INPATIENT: Freq: Every day | Status: DC

## 2016-07-11 MED ORDER — CARVEDILOL 3.125 MG PO TABS
3.1250 mg | ORAL_TABLET | Freq: Two times a day (BID) | ORAL | Status: DC
  Administered 2016-07-11 – 2016-07-13 (×4): 3.125 mg via ORAL
  Filled 2016-07-11 (×4): qty 1

## 2016-07-11 NOTE — Evaluation (Signed)
Physical Therapy Evaluation Patient Details Name: [name] MRN: [id number] DOB: 02/20/1951 Today's Date: 07/11/2016   History of Present Illness  Pt is a 66 y/o male admitted secondary to acute respiratory failure. Pt was intubated from 3/1 to 3/2. PMH including but not limited to COPD, CHF, CAD, hx of CVA and substance abuse.  Clinical Impression  Pt presented supine in bed with HOB elevated, awake and willing to participate in therapy session. Prior to admission, pt reported that he was mod I with functional mobility, using a SPC to ambulate within his community and independent with ADLs. Pt performing all functional mobility with supervision during evaluation.  He ambulated 150' with supervision, no AD, on RA with SPO2 maintaining >90% throughout. No further acute PT needs identified at this time. PT signing off.    Follow Up Recommendations No PT follow up    Equipment Recommendations  None recommended by PT    Recommendations for Other Services       Precautions / Restrictions Precautions Precautions: Fall Restrictions Weight Bearing Restrictions: No      Mobility  Bed Mobility Overal bed mobility: Needs Assistance Bed Mobility: Supine to Sit;Sit to Supine     Supine to sit: Supervision Sit to supine: Supervision   General bed mobility comments: increased time, supervision for safety and line management  Transfers Overall transfer level: Needs assistance Equipment used: None Transfers: Sit to/from Stand Sit to Stand: Supervision         General transfer comment: increased time, steady rise from bed, supervision for safety  Ambulation/Gait Ambulation/Gait assistance: Supervision Ambulation Distance (Feet): 150 Feet Assistive device: None Gait Pattern/deviations: Step-through pattern;Decreased step length - right;Decreased step length - left;Decreased stride length Gait velocity: decreased Gait velocity interpretation: Below normal speed for  age/gender General Gait Details: steady gait with no LOB, supervision for safety  Stairs            Wheelchair Mobility    Modified Rankin (Stroke Patients Only)       Balance Overall balance assessment: Needs assistance Sitting-balance support: Feet supported Sitting balance-Leahy Scale: Good     Standing balance support: During functional activity;No upper extremity supported Standing balance-Leahy Scale: Fair                               Pertinent Vitals/Pain Pain Assessment: No/denies pain    Home Living Family/patient expects to be discharged to:: Private residence Living Arrangements: Alone Available Help at Discharge: Family;Available PRN/intermittently Type of Home: House Home Access: Stairs to enter Entrance Stairs-Rails: None Entrance Stairs-Number of Steps: 3 Home Layout: One level Home Equipment: Cane - single point      Prior Function Level of Independence: Independent with assistive device(s)         Comments: pt reported that he ambulates with use of SPC when out in his community. Does not use AD within his home. Independent with ADLs.     Hand Dominance        Extremity/Trunk Assessment   Upper Extremity Assessment Upper Extremity Assessment: Overall WFL for tasks assessed    Lower Extremity Assessment Lower Extremity Assessment: Generalized weakness       Communication   Communication: No difficulties  Cognition Arousal/Alertness: Awake/alert Behavior During Therapy: WFL for tasks assessed/performed Overall Cognitive Status: Within Functional Limits for tasks assessed  General Comments      Exercises     Assessment/Plan    PT Assessment Patent does not need any further PT services  PT Problem List         PT Treatment Interventions      PT Goals (Current goals can be found in the Care Plan section)  Acute Rehab PT Goals Patient Stated Goal: return home on Monday     Frequency     Barriers to discharge        Co-evaluation               End of Session Equipment Utilized During Treatment: Gait belt Activity Tolerance: Patient tolerated treatment well Patient left: in bed;with call bell/phone within reach Nurse Communication: Mobility status PT Visit Diagnosis: Other abnormalities of gait and mobility (R26.89)         Time: 0912-0929 PT Time Calculation (min) (ACUTE ONLY): 17 min   Charges:   PT Evaluation $PT Eval Moderate Complexity: 1 Procedure     PT G CodesAlessandra Vazquez [name] 07/11/2016, 11:37 AM [name], PT, DPT [phone]

## 2016-07-11 NOTE — Progress Notes (Addendum)
ANTICOAGULATION CONSULT NOTE - Follow Up Consult  Pharmacy Consult for Heparin/resume Coumadin Indication: atrial fibrillation  No Known Allergies  Patient Measurements: Height: 6' (182.9 cm) Weight: 140 lb 6.9 oz (63.7 kg) IBW/kg (Calculated) : 77.6  Vital Signs: Temp: 98.2 F (36.8 C) (03/03 0800) Temp Source: Oral (03/03 0800) BP: 100/75 (03/03 0800) Pulse Rate: 65 (03/03 0800)  Labs:  Recent Labs  07/09/16 1824 07/09/16 1847 07/09/16 2047  07/10/16 0229 07/10/16 0828 07/10/16 1210 07/10/16 2035 07/11/16 0214  HGB 14.8 16.7  --   --  14.5  --   --   --  13.2  HCT 45.8 49.0  --   --  44.6  --   --   --  41.0  PLT 169  --   --   --  159  --   --   --  166  LABPROT 17.6*  --   --   --   --   --   --   --   --   INR 1.44  --   --   --   --   --   --   --   --   HEPARINUNFRC  --   --   --   < > 0.30  --  0.26* 0.26* 0.34  CREATININE  --  1.40*  --   --  1.44*  --   --   --  1.19  TROPONINI  --   --  0.06*  --  0.04* 0.03*  --   --   --   < > = values in this interval not displayed.  Estimated Creatinine Clearance: 57.2 mL/min (by C-G formula based on SCr of 1.19 mg/dL).  . sodium chloride 20 mL/hr at 07/10/16 1900  . heparin 1,150 Units/hr (07/10/16 2125)     Assessment: Patient is a 66 yo male admitted with shortness of breath. He was intubated on admission. Per PTA medication list, he was on warfarin 5mg  PO daily for atrial fibrillation/history of stroke. INR was subtherapeutic at 1.44 on admission, last warfarin dose unknown.  Heparin level now at goal.  No bleeding or complications noted.  Pharmacy asked to resume Coumadin today.  Per patient report taking Coumadin 5 mg daily PTA.  Goal of Therapy:  Heparin level 0.3-0.7 units/ml Monitor platelets by anticoagulation protocol: Yes   Plan:  Continue IV heparin at current rate. Daily heparin level and CBC. Coumadin 7.5 mg x 1 tonight. Daily PT/INR.  Tad MooreJessica Renai Lopata, Pharm D, BCPS  Clinical  Pharmacist Pager [phone]  07/11/2016 8:34 AM

## 2016-07-11 NOTE — Progress Notes (Signed)
[facility] Nursing ICU Electrolyte Replacement Protocol  Patient Name: Martin AbedMelvin L Lawrence DOB: 08/17/1950 MRN: [id number]  Date of Service  07/11/2016   HPI/Events of Note    Recent Labs Lab 07/09/16 1847 07/09/16 2047 07/10/16 0229 07/11/16 0214  NA 140  --  138 139  K 3.9  --  3.8 3.1*  CL 104  --  106 100*  CO2  --   --  20* 28  GLUCOSE 144*  --  138* 90  BUN 19  --  15 21*  CREATININE 1.40*  --  1.44* 1.19  CALCIUM  --   --  9.1 8.7*  MG  --  1.9 2.1 1.9  PHOS  --  2.4* 2.5 2.8    Estimated Creatinine Clearance: 57.2 mL/min (by C-G formula based on SCr of 1.19 mg/dL).  Intake/Output      03/02 0701 - 03/03 0700   P.O. 444   I.V. (mL/kg) 684.8 (10.8)   NG/GT 80   Total Intake(mL/kg) 1208.8 (19)   Urine (mL/kg/hr) 5470 (3.6)   Total Output 5470   Net -4261.2        - I/O DETAILED x24h    Total I/O In: 786.9 [P.O.:444; I.V.:342.9] Out: 3870 [Urine:3870] - I/O THIS SHIFT    ASSESSMENT   eICURN Interventions  K+ replaced using protocol   ASSESSMENT: MAJOR ELECTROLYTE    [name], [name] 07/11/2016, 6:46 AM

## 2016-07-11 NOTE — Progress Notes (Signed)
On initial skin assessment, patient noted to have old chest tube suture in place on the right.  Will request order from MD to remove in am.

## 2016-07-11 NOTE — Progress Notes (Signed)
Admission date: 07/09/2016 Referring provider: Dr. Rosalia Hammersay, ER  66 yo male smoker brought to ER with shortness of breath.  He had elevated blood pressure. He required intubation.  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  He has been intubated several times in the past year.  He is reportedly on coumadin for A fib, but INR today 1.44.  Subjective:  No distress. Happy to be alive.  Vital signs: BP 100/75 (BP Location: Right Arm)   Pulse 65   Temp 98.2 F (36.8 C) (Oral)   Resp 16   Ht 6' (1.829 m)   Wt 140 lb 6.9 oz (63.7 kg)   SpO2 99%   BMI 19.05 kg/m   Intake/output:  Intake/Output Summary (Last 24 hours) at 07/11/16 0924 Last data filed at 07/11/16 0800  Gross per 24 hour  Intake          1334.38 ml  Output             5470 ml  Net         -4135.62 ml    General: frail 66 year old male, appears older than stated age. No distress.  Neuro: awake, oriented and w/out focal def  Eyes: round equal and reactive  ENT neck is supple w/ no JVD Cardiac:irreg irreg w/ AF Chest:clear and w/out accessory use  Abd: soft, not tender told diet  Ext: no edema warm and dry   CMP Latest Ref Rng & Units 07/11/2016 07/10/2016 07/09/2016  Glucose 65 - 99 mg/dL 90 161(W138(H) 960(A144(H)  BUN 6 - 20 mg/dL 54(U21(H) 15 19  Creatinine 0.61 - 1.24 mg/dL 9.811.19 1.91(Y1.44(H) 7.82(N1.40(H)  Sodium 135 - 145 mmol/L 139 138 140  Potassium 3.5 - 5.1 mmol/L 3.1(L) 3.8 3.9  Chloride 101 - 111 mmol/L 100(L) 106 104  CO2 22 - 32 mmol/L 28 20(L) -  Calcium 8.9 - 10.3 mg/dL 5.6(O8.7(L) 9.1 -  Total Protein 6.5 - 8.1 g/dL - - -  Total Bilirubin 0.3 - 1.2 mg/dL - - -  Alkaline Phos 38 - 126 U/L - - -  AST 15 - 41 U/L - - -  ALT 17 - 63 U/L - - -   CBC Latest Ref Rng & Units 07/11/2016 07/10/2016 07/09/2016  WBC 4.0 - 10.5 K/uL 8.5 5.8 -  Hemoglobin 13.0 - 17.0 g/dL 13.013.2 86.514.5 78.416.7  Hematocrit 39.0 - 52.0 % 41.0 44.6 49.0  Platelets 150 - 400 K/uL 166 159 -   ABG    Component Value Date/Time   PHART 7.498  (H) 07/10/2016 0237   PCO2ART 24.1 (L) 07/10/2016 0237   PO2ART 126 (H) 07/10/2016 0237   HCO3 18.5 (L) 07/10/2016 0237   TCO2 27 07/09/2016 1904   ACIDBASEDEF 4.2 (H) 07/10/2016 0237   O2SAT 98.7 07/10/2016 0237   CBG (last 3)   Recent Labs  07/10/16 0429 07/10/16 0754 07/10/16 1225  GLUCAP 113* 113* 124*   Imaging: Dg Chest Port 1 View  Result Date: 07/10/2016 CLINICAL DATA:  Respiratory failure. EXAM: PORTABLE CHEST 1 VIEW COMPARISON:  07/09/2016. FINDINGS: Endotracheal tube and NG tube in stable position. Cardiomegaly. Bilateral pulmonary interstitial prominence with small bilateral pleural effusions again noted. Slight improvement from prior exam. COPD. No acute bony abnormality . IMPRESSION: 1. Lines and tubes in stable position. 2. Cardiomegaly with bilateral from interstitial prominence and small pleural effusions consistent with mild CHF again noted. Slight improvement from prior exam . Mild pneumonitis cannot be excluded. 3.  COPD. Electronically Signed   By: Maisie Fus  Register   On: 07/10/2016 07:34   Dg Chest Portable 1 View  Result Date: 07/09/2016 CLINICAL DATA:  ET tube placement EXAM: PORTABLE CHEST 1 VIEW COMPARISON:  07/09/2016 FINDINGS: Endotracheal tube with the tip 6.8 cm above the carina. Nasogastric tube coursing below the diaphragm. Bilateral diffuse interstitial thickening. No pleural effusion or pneumothorax. Stable cardiomegaly. The osseous structures are unremarkable. IMPRESSION: 1. Endotracheal tube with the tip 6.8 cm above the carina. Electronically Signed   By: Elige Ko   On: 07/09/2016 18:40   Dg Chest Port 1 View  Result Date: 07/09/2016 CLINICAL DATA:  Restrained distress EXAM: PORTABLE CHEST 1 VIEW COMPARISON:  02/01/2016 FINDINGS: There is bilateral diffuse interstitial thickening. There is no focal consolidation, pleural effusion or pneumothorax. There is stable cardiomegaly. There is no acute osseous abnormality IMPRESSION: Findings concerning for mild  pulmonary edema. Electronically Signed   By: Elige Ko   On: 07/09/2016 18:28   Studies:   Antibiotics: None  Cultures: Sputum 3/01 >> Respiratory viral panel 3/01 >>neg Influenza 'PCR 3/01 >>  Lines/tubes: ETT 3/01 >>3/2  Events: 3/01 Admit 3/2 extubated  Impression/plan Resolved issues Acute encephalopathy  Current issues.   Acute respiratory failure in the setting of Pulmonary edema (likely a mix of hypertensive emergency AND cocaine). Extubated 3/2 Plan Wean oxygen Mobilize  H/o COPD/emphysema Plan Resume spiriva PRN SABA O2 as needed  Acute on chronic combined heart failure w/ hypertensive emergency H/o afib, CAD, HTN, HLD and PAD.  Plan Cont heparin gtt and resume coumadin Cont asa and lipitor Resume coreg and daily lasix   Stage III CKD Scr improved.  Plan Resume home rx F/u am chem  Hypokalemia Plan Replace and recheck  Cocaine abuse Plan Instructed to stop  Can move to tele, [facility] to assume care  Simonne Martinet ACNP-BC [facility] Pulmonary/Critical Care Pager # [phone] OR # [phone] if no answer

## 2016-07-11 NOTE — Progress Notes (Signed)
Deterding MD called and notified regarding K of 3.1. No new orders given at this time.

## 2016-07-12 DIAGNOSIS — N179 Acute kidney failure, unspecified: Secondary | ICD-10-CM

## 2016-07-12 DIAGNOSIS — N182 Chronic kidney disease, stage 2 (mild): Secondary | ICD-10-CM

## 2016-07-12 DIAGNOSIS — I480 Paroxysmal atrial fibrillation: Secondary | ICD-10-CM

## 2016-07-12 DIAGNOSIS — I5023 Acute on chronic systolic (congestive) heart failure: Secondary | ICD-10-CM

## 2016-07-12 LAB — CBC
HEMATOCRIT: 39 % (ref 39.0–52.0)
Hemoglobin: 12.5 g/dL — ABNORMAL LOW (ref 13.0–17.0)
MCH: 27.6 pg (ref 26.0–34.0)
MCHC: 32.1 g/dL (ref 30.0–36.0)
MCV: 86.1 fL (ref 78.0–100.0)
Platelets: 166 10*3/uL (ref 150–400)
RBC: 4.53 MIL/uL (ref 4.22–5.81)
RDW: 13.9 % (ref 11.5–15.5)
WBC: 5.9 10*3/uL (ref 4.0–10.5)

## 2016-07-12 LAB — BASIC METABOLIC PANEL
Anion gap: 8 (ref 5–15)
BUN: 17 mg/dL (ref 6–20)
CHLORIDE: 104 mmol/L (ref 101–111)
CO2: 27 mmol/L (ref 22–32)
CREATININE: 1.28 mg/dL — AB (ref 0.61–1.24)
Calcium: 9 mg/dL (ref 8.9–10.3)
GFR calc Af Amer: >60 mL/min (ref >60)
GFR calc non Af Amer: 58 mL/min — ABNORMAL LOW (ref >60)
GLUCOSE: 100 mg/dL — AB (ref 65–99)
Potassium: 3.7 mmol/L (ref 3.5–5.1)
Sodium: 139 mmol/L (ref 135–145)

## 2016-07-12 LAB — PROTIME-INR
INR: 1.19
PROTHROMBIN TIME: 15.2 s (ref 11.4–15.2)

## 2016-07-12 LAB — HEPARIN LEVEL (UNFRACTIONATED): Heparin Unfractionated: 0.32 IU/mL (ref 0.30–0.70)

## 2016-07-12 MED ORDER — WARFARIN SODIUM 7.5 MG PO TABS
7.5000 mg | ORAL_TABLET | Freq: Once | ORAL | Status: AC
  Administered 2016-07-12: 7.5 mg via ORAL
  Filled 2016-07-12: qty 1

## 2016-07-12 NOTE — Progress Notes (Addendum)
PROGRESS NOTE    JILLIAN PIANKA  ZOX:[id number] DOB: 1950-10-30 DOA: 07/09/2016 PCP: [facility]     Brief Narrative: This is a 66 yo male with history of copd and systolic heart failure, presents with the chief complain of dyspnea. Progressive and worsening symptoms, initial evaluation was hypertensive and respiratory distress, required intubation. Lung examination with bibasilar rales. Admitted to the ICU with acute pulmonary edema associated with cocaine intoxication. Patient had aggressive diuresis, successfully liberated from mechanical ventilation on 03/02. Patient on nasal cannula. Chest pain free.    Assessment & Plan:   Active Problems:   Respiratory failure (HCC)   1. Acute on chronic decompensated systolic heart failure (ischemic cardiomyopathy). Will hold diuresis for now due to hypotension, keep negative fluid balance, urine output 950 over last 24 hours, with a total negative fluid balance since admission of 4,631 ml. Will continue asa, carvedilol, holding on ace inh due to risk of hypotension. Old records personally reviewed, echo from 2012 with left ventricle ejection fraction 20 to 25% with inferior, inferior septal, distal septal, lateral, and apical akinesis.  Check echocardiogram.   2. Cocaine intoxication. Patient tolerating well coreg, counseling patient to avoid cocaine.   3. Cardiogenic pulmonary edema. Chest film from 03/02, personally reviewed noted improving pulmonary edema. Will continue oxymetry monitoring and supplemental 02 per [place] to target 02 saturation above 92%  4. AKI on CKD stage 2 (base creatinine 1.0 with calculated GFR 92). Worsening renal function with cr at 1,28 from 1,19. Will hold on furosemide, avoid hypotension and nephrotoxic medications. K at 3,7 and serum bicarbonate at 27.   5. Paroxysmal atrial fibrillation on anticoagulation. Rate controlled, ekg personally reviewed noted right axis deviation with right bundle branch  block on a sinus rhythm. INR 1,19, will continue on warfarin and will hold on heparin drip for now, no need for bridging at this time.    DVT prophylaxis: enoxaparin  Code Status: full  Family Communication: I No family at the bedside  Disposition Plan: home    Consultants:   Pulmonary   Procedures:    Antimicrobials:    Subjective: Patient with improved dyspnea, no chest pain, no nausea or vomiting. Suture on the right since September, apparently post chest tube.   Objective: Vitals:   07/11/16 1958 07/12/16 0522 07/12/16 0821 07/12/16 1357  BP: 105/62 109/76  (!) 97/59  Pulse: 77 72  76  Resp: 17 17    Temp: 98.2 F (36.8 C) 97.8 F (36.6 C)  98.2 F (36.8 C)  TempSrc: Oral Oral  Oral  SpO2: 97% 100% 98% 99%  Weight: 58.7 kg (129 lb 6.4 oz) 58.7 kg (129 lb 6.4 oz)    Height:       No intake or output data in the 24 hours ending 07/12/16 1449 Filed Weights   07/10/16 0600 07/11/16 1958 07/12/16 0522  Weight: 63.7 kg (140 lb 6.9 oz) 58.7 kg (129 lb 6.4 oz) 58.7 kg (129 lb 6.4 oz)    Examination:  General exam: deconditioned E ENT: mild pallor, oral mucosa moist, no icterus.  Respiratory system: Mild rales at bases, no wheezing or rhonchi. Respiratory effort normal. Cardiovascular system: S1 & S2 heard, RRR. No JVD, murmurs, rubs, gallops or clicks. No pedal edema. Gastrointestinal system: Abdomen is nondistended, soft and nontender. No organomegaly or masses felt. Normal bowel sounds heard. Central nervous system: Alert and oriented. No focal neurological deficits. Extremities: Symmetric 5 x 5 power. Skin: No rashes, lesions or  ulcers. Suture x1 at the right costal region.     Data Reviewed: I have personally reviewed following labs and imaging studies  CBC:  Recent Labs Lab 07/09/16 1824 07/09/16 1847 07/10/16 0229 07/11/16 0214 07/12/16 0202  WBC 7.1  --  5.8 8.5 5.9  NEUTROABS 3.3  --   --   --   --   HGB 14.8 16.7 14.5 13.2 12.5*  HCT 45.8  49.0 44.6 41.0 39.0  MCV 86.7  --  85.9 85.1 86.1  PLT 169  --  159 166 166   Basic Metabolic Panel:  Recent Labs Lab 07/09/16 1847 07/09/16 2047 07/10/16 0229 07/11/16 0214 07/12/16 0202  NA 140  --  138 139 139  K 3.9  --  3.8 3.1* 3.7  CL 104  --  106 100* 104  CO2  --   --  20* 28 27  GLUCOSE 144*  --  138* 90 100*  BUN 19  --  15 21* 17  CREATININE 1.40*  --  1.44* 1.19 1.28*  CALCIUM  --   --  9.1 8.7* 9.0  MG  --  1.9 2.1 1.9  --   PHOS  --  2.4* 2.5 2.8  --    GFR: Estimated Creatinine Clearance: 49 mL/min (by C-G formula based on SCr of 1.28 mg/dL (H)). Liver Function Tests: No results for input(s): AST, ALT, ALKPHOS, BILITOT, PROT, ALBUMIN in the last 168 hours. No results for input(s): LIPASE, AMYLASE in the last 168 hours. No results for input(s): AMMONIA in the last 168 hours. Coagulation Profile:  Recent Labs Lab 07/09/16 1824 07/12/16 0202  INR 1.44 1.19   Cardiac Enzymes:  Recent Labs Lab 07/09/16 2047 07/10/16 0229 07/10/16 0828  TROPONINI 0.06* 0.04* 0.03*   BNP (last 3 results) No results for input(s): PROBNP in the last 8760 hours. HbA1C: No results for input(s): HGBA1C in the last 72 hours. CBG:  Recent Labs Lab 07/09/16 1813 07/10/16 0101 07/10/16 0429 07/10/16 0754 07/10/16 1225  GLUCAP 117* 139* 113* 113* 124*   Lipid Profile:  Recent Labs  07/09/16 2047  TRIG 56   Thyroid Function Tests: No results for input(s): TSH, T4TOTAL, FREET4, T3FREE, THYROIDAB in the last 72 hours. Anemia Panel: No results for input(s): VITAMINB12, FOLATE, FERRITIN, TIBC, IRON, RETICCTPCT in the last 72 hours. Sepsis Labs:  Recent Labs Lab 07/09/16 2047 07/10/16 0229 07/11/16 0214  PROCALCITON <0.10 0.22 0.25    Recent Results (from the past 240 hour(s))  Respiratory Panel by PCR     Status: None   Collection Time: 07/09/16 10:48 PM  Result Value Ref Range Status   Adenovirus NOT DETECTED NOT DETECTED Final   Coronavirus 229E NOT  DETECTED NOT DETECTED Final   Coronavirus HKU1 NOT DETECTED NOT DETECTED Final   Coronavirus NL63 NOT DETECTED NOT DETECTED Final   Coronavirus OC43 NOT DETECTED NOT DETECTED Final   Metapneumovirus NOT DETECTED NOT DETECTED Final   Rhinovirus / Enterovirus NOT DETECTED NOT DETECTED Final   Influenza A NOT DETECTED NOT DETECTED Final   Influenza B NOT DETECTED NOT DETECTED Final   Parainfluenza Virus 1 NOT DETECTED NOT DETECTED Final   Parainfluenza Virus 2 NOT DETECTED NOT DETECTED Final   Parainfluenza Virus 3 NOT DETECTED NOT DETECTED Final   Parainfluenza Virus 4 NOT DETECTED NOT DETECTED Final   Respiratory Syncytial Virus NOT DETECTED NOT DETECTED Final   Bordetella pertussis NOT DETECTED NOT DETECTED Final   Chlamydophila pneumoniae NOT DETECTED NOT DETECTED  Final   Mycoplasma pneumoniae NOT DETECTED NOT DETECTED Final  MRSA PCR Screening     Status: None   Collection Time: 07/09/16 10:48 PM  Result Value Ref Range Status   MRSA by PCR NEGATIVE NEGATIVE Final    Comment:        The GeneXpert MRSA Assay (FDA approved for NASAL specimens only), is one component of a comprehensive MRSA colonization surveillance program. It is not intended to diagnose MRSA infection nor to guide or monitor treatment for MRSA infections.          Radiology Studies: No results found.      Scheduled Meds: . aspirin  324 mg Oral Once  . aspirin  81 mg Per Tube Daily  . atorvastatin  40 mg Oral q1800  . carvedilol  3.125 mg Oral BID WC  . furosemide  40 mg Oral Daily  . mouth rinse  15 mL Mouth Rinse BID  . potassium chloride  40 mEq Oral Daily  . tiotropium  18 mcg Inhalation Daily  . warfarin  7.5 mg Oral ONCE-1800  . Warfarin - Pharmacist Dosing Inpatient   Does not apply q1800   Continuous Infusions: . sodium chloride 20 mL/hr at 07/10/16 1900  . heparin 1,150 Units/hr (07/11/16 2033)     LOS: 3 days      Nidia Grogan Annett Gula, MD [facility] Pager  [phone]  If 7PM-7AM, please contact night-coverage [email or web address] Password [email or web address] 07/12/2016, 2:49 PM

## 2016-07-12 NOTE — Progress Notes (Signed)
ANTICOAGULATION CONSULT NOTE - Follow Up Consult  Pharmacy Consult for Heparin/ Coumadin Indication: atrial fibrillation  No Known Allergies  Patient Measurements: Height: 6' (182.9 cm) Weight: 129 lb 6.4 oz (58.7 kg) IBW/kg (Calculated) : 77.6  Vital Signs: Temp: 97.8 F (36.6 C) (03/04 0522) Temp Source: Oral (03/04 0522) BP: 109/76 (03/04 0522) Pulse Rate: 72 (03/04 0522)  Labs:  Recent Labs  07/09/16 1824  07/09/16 2047 07/10/16 0229 07/10/16 0828  07/10/16 2035 07/11/16 0214 07/12/16 0202  HGB 14.8  < >  --  14.5  --   --   --  13.2 12.5*  HCT 45.8  < >  --  44.6  --   --   --  41.0 39.0  PLT 169  --   --  159  --   --   --  166 166  LABPROT 17.6*  --   --   --   --   --   --   --  15.2  INR 1.44  --   --   --   --   --   --   --  1.19  HEPARINUNFRC  --   --   --  0.30  --   < > 0.26* 0.34 0.32  CREATININE  --   < >  --  1.44*  --   --   --  1.19 1.28*  TROPONINI  --   --  0.06* 0.04* 0.03*  --   --   --   --   < > = values in this interval not displayed.  Estimated Creatinine Clearance: 49 mL/min (by C-G formula based on SCr of 1.28 mg/dL (H)).  . sodium chloride 20 mL/hr at 07/10/16 1900  . heparin 1,150 Units/hr (07/11/16 2033)     Assessment: Patient is a 66 yo male admitted with shortness of breath. He was intubated on admission. Per PTA medication list, he was on warfarin 5mg  PO daily for atrial fibrillation/history of stroke. INR was subtherapeutic at 1.44 on admission, last warfarin dose unknown.  Anticoag: PTA warfarin for h/o of afib.  INR 1.44 at admit > started on heparin. HL 0.32 remains in goal range. INR 1.19 --Spoke with pt > he thinks Coumadin dose 5 mg daily PTA, last had dose 2/28. Admit INR 1.44  Goal of Therapy:  Heparin level 0.3-0.7 units/ml Monitor platelets by anticoagulation protocol: Yes   Plan:  Heparin 1150 units/hr to continue Daily HL Coumadin 7.5 mg x 1 tonight    Carmelita Amparo S. Merilynn Finlandobertson, PharmD, [facility] CaguasBCPS Clinical Staff  Pharmacist Pager [phone]   07/12/2016 11:08 AM

## 2016-07-12 NOTE — Progress Notes (Signed)
Old chest tube suture on right chest wall removed without difficulty.

## 2016-07-13 ENCOUNTER — Inpatient Hospital Stay (HOSPITAL_COMMUNITY): Payer: Medicare Other

## 2016-07-13 ENCOUNTER — Other Ambulatory Visit (HOSPITAL_COMMUNITY): Payer: Medicare Other

## 2016-07-13 DIAGNOSIS — J42 Unspecified chronic bronchitis: Secondary | ICD-10-CM

## 2016-07-13 DIAGNOSIS — R0600 Dyspnea, unspecified: Secondary | ICD-10-CM

## 2016-07-13 DIAGNOSIS — I4891 Unspecified atrial fibrillation: Secondary | ICD-10-CM

## 2016-07-13 LAB — BASIC METABOLIC PANEL
ANION GAP: 4 — AB (ref 5–15)
BUN: 16 mg/dL (ref 6–20)
CALCIUM: 9 mg/dL (ref 8.9–10.3)
CHLORIDE: 108 mmol/L (ref 101–111)
CO2: 26 mmol/L (ref 22–32)
CREATININE: 1.12 mg/dL (ref 0.61–1.24)
GFR calc non Af Amer: >60 mL/min (ref >60)
Glucose, Bld: 87 mg/dL (ref 65–99)
Potassium: 3.9 mmol/L (ref 3.5–5.1)
Sodium: 138 mmol/L (ref 135–145)

## 2016-07-13 LAB — CBC
HEMATOCRIT: 39.2 % (ref 39.0–52.0)
Hemoglobin: 12.6 g/dL — ABNORMAL LOW (ref 13.0–17.0)
MCH: 27.6 pg (ref 26.0–34.0)
MCHC: 32.1 g/dL (ref 30.0–36.0)
MCV: 85.8 fL (ref 78.0–100.0)
Platelets: 175 10*3/uL (ref 150–400)
RBC: 4.57 MIL/uL (ref 4.22–5.81)
RDW: 13.8 % (ref 11.5–15.5)
WBC: 5.6 10*3/uL (ref 4.0–10.5)

## 2016-07-13 LAB — HEPARIN LEVEL (UNFRACTIONATED): Heparin Unfractionated: <0.1 IU/mL — ABNORMAL LOW (ref 0.30–0.70)

## 2016-07-13 LAB — ECHOCARDIOGRAM COMPLETE
Height: 72 in
Weight: 2141.11 oz

## 2016-07-13 LAB — PROTIME-INR
INR: 1.19
PROTHROMBIN TIME: 15.2 s (ref 11.4–15.2)

## 2016-07-13 MED ORDER — LISINOPRIL 2.5 MG PO TABS: 2.5000 mg | ORAL_TABLET | Freq: Every day | ORAL | 0 refills | Status: DC

## 2016-07-13 MED ORDER — WARFARIN SODIUM 10 MG PO TABS: 10.0000 mg | ORAL_TABLET | Freq: Once | ORAL | Status: DC

## 2016-07-13 MED ORDER — FUROSEMIDE 40 MG PO TABS: 40.0000 mg | ORAL_TABLET | Freq: Every day | ORAL | 0 refills | Status: DC

## 2016-07-13 MED ORDER — IPRATROPIUM-ALBUTEROL 0.5-2.5 (3) MG/3ML IN SOLN: 3.0000 mL | Freq: Four times a day (QID) | RESPIRATORY_TRACT | 0 refills | Status: DC | PRN

## 2016-07-13 MED ORDER — IPRATROPIUM-ALBUTEROL 0.5-2.5 (3) MG/3ML IN SOLN: 3.0000 mL | Freq: Four times a day (QID) | RESPIRATORY_TRACT | Status: DC | PRN

## 2016-07-13 MED ORDER — ATORVASTATIN CALCIUM 40 MG PO TABS: 40.0000 mg | ORAL_TABLET | Freq: Every day | ORAL | 0 refills | Status: DC

## 2016-07-13 MED ORDER — POTASSIUM CHLORIDE ER 10 MEQ PO TBCR: 10.0000 meq | EXTENDED_RELEASE_TABLET | Freq: Every day | ORAL | 0 refills | Status: DC

## 2016-07-13 MED ORDER — LISINOPRIL 2.5 MG PO TABS
2.5000 mg | ORAL_TABLET | Freq: Every day | ORAL | Status: DC
  Administered 2016-07-13: 2.5 mg via ORAL
  Filled 2016-07-13: qty 1

## 2016-07-13 NOTE — Discharge Summary (Addendum)
Physician Discharge Summary  [name]:[id number] DOB: 1951/02/21 DOA: 07/09/2016  PCP: [facility]  Admit date: 07/09/2016 Discharge date: 07/13/2016  Admitted From:  Home Disposition:  Home   Recommendations for Outpatient Follow-up:  1. Follow up with PCP in 1-week 2. Please follow INR in 48 hours.   Home Health: Heart failure home services Equipment/Devices: No   Discharge Condition: stable CODE STATUS: Full  Diet recommendation: Heart Healthy    Brief/Interim Summary: This is a 66 yo male with ischemic cardiomyopathy and systolic heart failure who presented to the hospital with the chief complain of dyspnea. Apparently he was on severe respiratory distress and required intubation and mechanical ventilation in the emergency department. Blood pressure was 165/144, heart rate 131, respiratory rate 30. Patient was placed on sedation, he had bibasilar rales, no wheezing, heart S1-S2 present irregularly irregular, abdomen soft, lower extremity with no significant edema. Sodium 140, potassium 3.9, chloride 104, glucose 144, BUN 19, creatinine 1.40, white count 7.1, hemoglobin 14.8, hematocrit 45.8, platelets 169, troponin 0.04. pH7.28, PCO2 53, PO2 375, bicarbonate 27, on invasive mechanical ventilation. INR 1,4. urine drug screen positive for benzodiazepines and cocaine. Chest x-ray showed hyperinflation with pulmonary edema. EKG was sinus tachycardia, right bundle branch block.  The patient was admitted to the hospital working diagnosis of acute hypoxemic and hypercapnic respiratory failure due to decompensated systolic heart failure and consequent cardiogenic pulmonary edema complicated by acute kidney injury, hypertensive emergency and cocaine intoxication.  1. Acute respiratory failure, hypoxic/hypercapnic. Patient was placed on invasive mechanical ventilation, admitted to the intensive care unit. He was aggressively diuresed, blood pressure was controlled, he  was successfully liberated from mechanical ventilation March 2, then he was transitioned to oxygen supplementation per nasal cannula, by the time of discharge his oxygenation was 95% on room air. Follow chest films show improvement of infiltrates.  2. Decompensated acute on chronic systolic heart failure, ischemic cardiomyopathy. Patient was continued on Coreg, aspirin and atorvastatin. Patient will be discharged on low-dose ACE inhibitor. Medical compliance was reinforced. Patient was advised to avoid cocaine. Follow-up echocardiography still pending. Home heart failure services has been ordered.   3. Paroxysmal atrial fibrillation. He remained rate controlled, initially placed on heparin IV infusion, warfarin was resumed. Patient's INR at discharge is 1.19, patient will need a follow-up INR in 48 hours. Target INR between 2 and 3.   4. Acute kidney injury on chronic kidney disease stage II. Calculated GFR 92 with a baseline creatinine 1.0. Patient's kidney function is improving, discharge creatinine 1.12, he will continue furosemide 40 mg daily along with potassium supplements 10 meq daily. Patient will need a close follow-up on kidney function and electrolytes as outpatient.   5. COPD. Patient will continue duoneb for bronchodilator therapy. Patient cannot afford tiotropium.   6. Tobacco abuse. Smoking cessation.  Late entry: echocardiogram with left ventricle 25% with diffuse hypokinesis.   Discharge Diagnoses:  Active Problems:   Respiratory failure [facility])    Discharge Instructions   Allergies as of 07/13/2016   No Known Allergies     Medication List    STOP taking these medications   ALKA-SELTZER PLUS COLD PO   oxyCODONE-acetaminophen 5-325 MG tablet Commonly known as:  PERCOCET/ROXICET   Tiotropium Bromide Monohydrate 1.25 MCG/ACT Aers Commonly known as:  SPIRIVA RESPIMAT     TAKE these medications   Albuterol Sulfate 108 (90 Base) MCG/ACT Aepb Inhale 1 puff into the  lungs every 6 (six) hours as needed. What changed:  reasons to take this   aspirin 81 MG tablet Take 1 tablet (81 mg total) by mouth daily. What changed:  when to take this   atorvastatin 40 MG tablet Commonly known as:  LIPITOR Take 1 tablet (40 mg total) by mouth daily at 6 PM.   carvedilol 6.25 MG tablet Commonly known as:  COREG Take 3.125 mg by mouth 2 (two) times daily.   furosemide 40 MG tablet Commonly known as:  LASIX Take 1 tablet (40 mg total) by mouth daily.   ipratropium-albuterol 0.5-2.5 (3) MG/3ML Soln Commonly known as:  DUONEB Take 3 mLs by nebulization every 6 (six) hours as needed.   lisinopril 2.5 MG tablet Commonly known as:  PRINIVIL,ZESTRIL Take 1 tablet (2.5 mg total) by mouth daily.   potassium chloride 10 MEQ tablet Commonly known as:  K-DUR Take 1 tablet (10 mEq total) by mouth daily.   warfarin 5 MG tablet Commonly known as:  COUMADIN Take 5 mg by mouth daily at 6 PM.            Durable Medical Equipment        Start     Ordered   07/13/16 0000  Heart failure home health orders  (Heart failure home health orders / Face to face)    Comments:  Heart Failure Follow-up Care:  Verify follow-up appointments per Patient Discharge Instructions. Confirm transportation arranged. Reconcile home medications with discharge medication list. Remove discontinued medications from use. Assist patient/caregiver to manage medications using pill box. Reinforce low sodium food selection Assessments: Vital signs and oxygen saturation at each visit. Assess home environment for safety concerns, caregiver support and availability of low-sodium foods. Consult Child psychotherapist, PT/OT, Dietitian, and CNA based on assessments. Perform comprehensive cardiopulmonary assessment. Notify MD for any change in condition or weight gain of 3 pounds in one day or 5 pounds in one week with symptoms. Daily Weights and Symptom Monitoring: Ensure patient has access to  scales. Teach patient/caregiver to weigh daily before breakfast and after voiding using same scale and record.    Teach patient/caregiver to track weight and symptoms and when to notify Provider. Activity: Develop individualized activity plan with patient/caregiver.   Question Answer Comment  Heart Failure Follow-up Care [facility] at [phone]   Obtain the following labs Basic Metabolic Panel   Lab frequency Weekly   Fax lab results to [facility] at [phone]   Diet Low Sodium Heart Healthy   Fluid restrictions: 1200 mL Fluid      07/13/16 1149   07/13/16 0000  DME Nebulizer machine    Question:  Patient needs a nebulizer to treat with the following condition  Answer:  Bronchitis   07/13/16 1149      No Known Allergies  Consultations:   Procedures/Studies: Dg Chest Port 1 View  Result Date: 07/10/2016 CLINICAL DATA:  Respiratory failure. EXAM: PORTABLE CHEST 1 VIEW COMPARISON:  07/09/2016. FINDINGS: Endotracheal tube and NG tube in stable position. Cardiomegaly. Bilateral pulmonary interstitial prominence with small bilateral pleural effusions again noted. Slight improvement from prior exam. COPD. No acute bony abnormality . IMPRESSION: 1. Lines and tubes in stable position. 2. Cardiomegaly with bilateral from interstitial prominence and small pleural effusions consistent with mild CHF again noted. Slight improvement from prior exam . Mild pneumonitis cannot be excluded. 3. COPD. Electronically Signed   By: Maisie Fus  Register   On: 07/10/2016 07:34   Dg Chest Portable 1 View  Result Date: 07/09/2016 CLINICAL DATA:  ET  tube placement EXAM: PORTABLE CHEST 1 VIEW COMPARISON:  07/09/2016 FINDINGS: Endotracheal tube with the tip 6.8 cm above the carina. Nasogastric tube coursing below the diaphragm. Bilateral diffuse interstitial thickening. No pleural effusion or pneumothorax. Stable cardiomegaly. The osseous structures are unremarkable. IMPRESSION: 1.  Endotracheal tube with the tip 6.8 cm above the carina. Electronically Signed   By: Elige Ko   On: 07/09/2016 18:40   Dg Chest Port 1 View  Result Date: 07/09/2016 CLINICAL DATA:  Restrained distress EXAM: PORTABLE CHEST 1 VIEW COMPARISON:  02/01/2016 FINDINGS: There is bilateral diffuse interstitial thickening. There is no focal consolidation, pleural effusion or pneumothorax. There is stable cardiomegaly. There is no acute osseous abnormality IMPRESSION: Findings concerning for mild pulmonary edema. Electronically Signed   By: Elige Ko   On: 07/09/2016 18:28    (Echo, Carotid, EGD, Colonoscopy, ERCP)    Subjective: Patient feeling better, no nausea or vomiting. Dyspnea has improved.   Discharge Exam: Vitals:   07/13/16 0500 07/13/16 1052  BP: 104/73 110/74  Pulse: 80 82  Resp: 17   Temp: 98.3 F (36.8 C)    Vitals:   07/12/16 2103 07/13/16 0500 07/13/16 0923 07/13/16 1052  BP: 120/86 104/73  110/74  Pulse: 86 80  82  Resp: 17 17    Temp: 98.2 F (36.8 C) 98.3 F (36.8 C)    TempSrc: Oral Oral    SpO2: 100% 96% 95%   Weight:  60.7 kg (133 lb 13.1 oz)    Height:        General: Pt is alert, awake, not in acute distress Cardiovascular: RRR, S1/S2 +, no rubs, no gallops Respiratory: CTA bilaterally, no wheezing, no rhonchi Abdominal: Soft, NT, ND, bowel sounds + Extremities: no edema, no cyanosis    The results of significant diagnostics from this hospitalization (including imaging, microbiology, ancillary and laboratory) are listed below for reference.     Microbiology: Recent Results (from the past 240 hour(s))  Respiratory Panel by PCR     Status: None   Collection Time: 07/09/16 10:48 PM  Result Value Ref Range Status   Adenovirus NOT DETECTED NOT DETECTED Final   Coronavirus 229E NOT DETECTED NOT DETECTED Final   Coronavirus HKU1 NOT DETECTED NOT DETECTED Final   Coronavirus NL63 NOT DETECTED NOT DETECTED Final   Coronavirus OC43 NOT DETECTED NOT  DETECTED Final   Metapneumovirus NOT DETECTED NOT DETECTED Final   Rhinovirus / Enterovirus NOT DETECTED NOT DETECTED Final   Influenza A NOT DETECTED NOT DETECTED Final   Influenza B NOT DETECTED NOT DETECTED Final   Parainfluenza Virus 1 NOT DETECTED NOT DETECTED Final   Parainfluenza Virus 2 NOT DETECTED NOT DETECTED Final   Parainfluenza Virus 3 NOT DETECTED NOT DETECTED Final   Parainfluenza Virus 4 NOT DETECTED NOT DETECTED Final   Respiratory Syncytial Virus NOT DETECTED NOT DETECTED Final   Bordetella pertussis NOT DETECTED NOT DETECTED Final   Chlamydophila pneumoniae NOT DETECTED NOT DETECTED Final   Mycoplasma pneumoniae NOT DETECTED NOT DETECTED Final  MRSA PCR Screening     Status: None   Collection Time: 07/09/16 10:48 PM  Result Value Ref Range Status   MRSA by PCR NEGATIVE NEGATIVE Final    Comment:        The GeneXpert MRSA Assay (FDA approved for NASAL specimens only), is one component of a comprehensive MRSA colonization surveillance program. It is not intended to diagnose MRSA infection nor to guide or monitor treatment for MRSA infections.  Labs: BNP (last 3 results)  Recent Labs  01/04/16 2134 01/29/16 0619 07/09/16 1828  BNP 311.2* 316.5* 1,627.3*   Basic Metabolic Panel:  Recent Labs Lab 07/09/16 1847 07/09/16 2047 07/10/16 0229 07/11/16 0214 07/12/16 0202 07/13/16 0121  NA 140  --  138 139 139 138  K 3.9  --  3.8 3.1* 3.7 3.9  CL 104  --  106 100* 104 108  CO2  --   --  20* 28 27 26   GLUCOSE 144*  --  138* 90 100* 87  BUN 19  --  15 21* 17 16  CREATININE 1.40*  --  1.44* 1.19 1.28* 1.12  CALCIUM  --   --  9.1 8.7* 9.0 9.0  MG  --  1.9 2.1 1.9  --   --   PHOS  --  2.4* 2.5 2.8  --   --    Liver Function Tests: No results for input(s): AST, ALT, ALKPHOS, BILITOT, PROT, ALBUMIN in the last 168 hours. No results for input(s): LIPASE, AMYLASE in the last 168 hours. No results for input(s): AMMONIA in the last 168  hours. CBC:  Recent Labs Lab 07/09/16 1824 07/09/16 1847 07/10/16 0229 07/11/16 0214 07/12/16 0202 07/13/16 0121  WBC 7.1  --  5.8 8.5 5.9 5.6  NEUTROABS 3.3  --   --   --   --   --   HGB 14.8 16.7 14.5 13.2 12.5* 12.6*  HCT 45.8 49.0 44.6 41.0 39.0 39.2  MCV 86.7  --  85.9 85.1 86.1 85.8  PLT 169  --  159 166 166 175   Cardiac Enzymes:  Recent Labs Lab 07/09/16 2047 07/10/16 0229 07/10/16 0828  TROPONINI 0.06* 0.04* 0.03*   BNP: Invalid input(s): POCBNP CBG:  Recent Labs Lab 07/09/16 1813 07/10/16 0101 07/10/16 0429 07/10/16 0754 07/10/16 1225  GLUCAP 117* 139* 113* 113* 124*   D-Dimer No results for input(s): DDIMER in the last 72 hours. Hgb A1c No results for input(s): HGBA1C in the last 72 hours. Lipid Profile No results for input(s): CHOL, HDL, LDLCALC, TRIG, CHOLHDL, LDLDIRECT in the last 72 hours. Thyroid function studies No results for input(s): TSH, T4TOTAL, T3FREE, THYROIDAB in the last 72 hours.  Invalid input(s): FREET3 Anemia work up No results for input(s): VITAMINB12, FOLATE, FERRITIN, TIBC, IRON, RETICCTPCT in the last 72 hours. Urinalysis    Component Value Date/Time   COLORURINE YELLOW 07/06/2011 0221   APPEARANCEUR CLEAR 07/06/2011 0221   LABSPEC 1.012 07/06/2011 0221   PHURINE 6.0 07/06/2011 0221   GLUCOSEU NEGATIVE 07/06/2011 0221   HGBUR NEGATIVE 07/06/2011 0221   BILIRUBINUR NEGATIVE 07/06/2011 0221   KETONESUR NEGATIVE 07/06/2011 0221   PROTEINUR 30 (A) 07/06/2011 0221   UROBILINOGEN 1.0 07/06/2011 0221   NITRITE NEGATIVE 07/06/2011 0221   LEUKOCYTESUR NEGATIVE 07/06/2011 0221   Sepsis Labs Invalid input(s): PROCALCITONIN,  WBC,  LACTICIDVEN Microbiology Recent Results (from the past 240 hour(s))  Respiratory Panel by PCR     Status: None   Collection Time: 07/09/16 10:48 PM  Result Value Ref Range Status   Adenovirus NOT DETECTED NOT DETECTED Final   Coronavirus 229E NOT DETECTED NOT DETECTED Final   Coronavirus  HKU1 NOT DETECTED NOT DETECTED Final   Coronavirus NL63 NOT DETECTED NOT DETECTED Final   Coronavirus OC43 NOT DETECTED NOT DETECTED Final   Metapneumovirus NOT DETECTED NOT DETECTED Final   Rhinovirus / Enterovirus NOT DETECTED NOT DETECTED Final   Influenza A NOT DETECTED NOT DETECTED Final  Influenza B NOT DETECTED NOT DETECTED Final   Parainfluenza Virus 1 NOT DETECTED NOT DETECTED Final   Parainfluenza Virus 2 NOT DETECTED NOT DETECTED Final   Parainfluenza Virus 3 NOT DETECTED NOT DETECTED Final   Parainfluenza Virus 4 NOT DETECTED NOT DETECTED Final   Respiratory Syncytial Virus NOT DETECTED NOT DETECTED Final   Bordetella pertussis NOT DETECTED NOT DETECTED Final   Chlamydophila pneumoniae NOT DETECTED NOT DETECTED Final   Mycoplasma pneumoniae NOT DETECTED NOT DETECTED Final  MRSA PCR Screening     Status: None   Collection Time: 07/09/16 10:48 PM  Result Value Ref Range Status   MRSA by PCR NEGATIVE NEGATIVE Final    Comment:        The GeneXpert MRSA Assay (FDA approved for NASAL specimens only), is one component of a comprehensive MRSA colonization surveillance program. It is not intended to diagnose MRSA infection nor to guide or monitor treatment for MRSA infections.      Time coordinating discharge: 45 minutes  SIGNED:   Coralie KeensMauricio Daniel Damier Disano, MD  [facility] 07/13/2016, 11:12 AM Pager   If 7PM-7AM, please contact night-coverage [email or web address] Password [email or web address]

## 2016-07-13 NOTE — Discharge Instructions (Signed)

## 2016-07-13 NOTE — Progress Notes (Signed)
ANTICOAGULATION CONSULT NOTE - Follow Up Consult  Pharmacy Consult for Coumadin Indication: atrial fibrillation and prior stroke  No Known Allergies  Patient Measurements: Height: 6' (182.9 cm) Weight: 133 lb 13.1 oz (60.7 kg) IBW/kg (Calculated) : 77.6  Vital Signs: Temp: 98.3 F (36.8 C) (03/05 0500) Temp Source: Oral (03/05 0500) BP: 104/73 (03/05 0500) Pulse Rate: 80 (03/05 0500)  Labs:  Recent Labs  07/11/16 0214 07/12/16 0202 07/13/16 0121  HGB 13.2 12.5* 12.6*  HCT 41.0 39.0 39.2  PLT 166 166 175  LABPROT  --  15.2 15.2  INR  --  1.19 1.19  HEPARINUNFRC 0.34 0.32 <0.10*  CREATININE 1.19 1.28* 1.12    Estimated Creatinine Clearance: 58 mL/min (by C-G formula based on SCr of 1.12 mg/dL).  Assessment:   66 yr old male on Coumadin for afib and prior stroke.  Heparin bridge stopped 3/4.   INR only 1.19 after Coumadin 7.5 mg daily x 2 days.   Home Coumadin regimen: 5 mg daily.  INR only 1.44 on admission.  Patient reports same regimen for some time, and that he had not missed any doses prior to admission.  He also reports that he had not had outpatient INR done in "a qwhile".  Goal of Therapy:  INR 2-3 Monitor platelets by anticoagulation protocol: Yes   Plan:   Coumadin 10 mg x 1 today.  Daily PT/INR while in the hospital.  If discharged today, would resume Coumadin 5 mg daily on 07/14/16 and check PT/INR by the end of the week.  Dennie FettersEgan, Million Maharaj Donovan, RPh Pager: [phone] 07/13/2016,10:51 AM

## 2016-07-13 NOTE — Progress Notes (Signed)
  Echocardiogram 2D Echocardiogram has been performed.  Tye SavoyCasey N Kavya Haag 07/13/2016, 1:29 PM

## 2016-07-13 NOTE — Care Management Important Message (Signed)
Important Message  Patient Details  Name: [name] MRN: [id number] Date of Birth: 08/10/1950   Medicare Important Message Given:  Yes    Kyla BalzarineShealy, Sarajean Dessert Abena 07/13/2016, 4:32 PM

## 2016-08-06 ENCOUNTER — Other Ambulatory Visit: Payer: Self-pay

## 2016-08-06 ENCOUNTER — Encounter (HOSPITAL_COMMUNITY): Payer: Self-pay

## 2016-08-06 ENCOUNTER — Emergency Department (HOSPITAL_COMMUNITY): Payer: Medicare Other

## 2016-08-06 ENCOUNTER — Inpatient Hospital Stay (HOSPITAL_COMMUNITY)
Admission: EM | Admit: 2016-08-06 | Discharge: 2016-08-09 | DRG: 208 | Disposition: A | Discharge status: Home / Self Care | Payer: Medicare Other | Attending: Family Medicine | Admitting: Family Medicine

## 2016-08-06 DIAGNOSIS — J9602 Acute respiratory failure with hypercapnia: Secondary | ICD-10-CM | POA: Diagnosis present

## 2016-08-06 DIAGNOSIS — I959 Hypotension, unspecified: Secondary | ICD-10-CM | POA: Diagnosis present

## 2016-08-06 DIAGNOSIS — I480 Paroxysmal atrial fibrillation: Secondary | ICD-10-CM | POA: Diagnosis present

## 2016-08-06 DIAGNOSIS — E872 Acidosis: Secondary | ICD-10-CM | POA: Diagnosis present

## 2016-08-06 DIAGNOSIS — J9690 Respiratory failure, unspecified, unspecified whether with hypoxia or hypercapnia: Secondary | ICD-10-CM

## 2016-08-06 DIAGNOSIS — E876 Hypokalemia: Secondary | ICD-10-CM | POA: Diagnosis present

## 2016-08-06 DIAGNOSIS — I255 Ischemic cardiomyopathy: Secondary | ICD-10-CM | POA: Diagnosis present

## 2016-08-06 DIAGNOSIS — G9341 Metabolic encephalopathy: Secondary | ICD-10-CM | POA: Diagnosis present

## 2016-08-06 DIAGNOSIS — Z978 Presence of other specified devices: Secondary | ICD-10-CM

## 2016-08-06 DIAGNOSIS — J811 Chronic pulmonary edema: Secondary | ICD-10-CM

## 2016-08-06 DIAGNOSIS — J810 Acute pulmonary edema: Secondary | ICD-10-CM | POA: Diagnosis present

## 2016-08-06 DIAGNOSIS — Z7901 Long term (current) use of anticoagulants: Secondary | ICD-10-CM | POA: Diagnosis not present

## 2016-08-06 DIAGNOSIS — J9601 Acute respiratory failure with hypoxia: Secondary | ICD-10-CM | POA: Diagnosis present

## 2016-08-06 DIAGNOSIS — Z681 Body mass index (BMI) 19 or less, adult: Secondary | ICD-10-CM | POA: Diagnosis not present

## 2016-08-06 DIAGNOSIS — R64 Cachexia: Secondary | ICD-10-CM | POA: Diagnosis present

## 2016-08-06 DIAGNOSIS — F1490 Cocaine use, unspecified, uncomplicated: Secondary | ICD-10-CM | POA: Diagnosis present

## 2016-08-06 DIAGNOSIS — N179 Acute kidney failure, unspecified: Secondary | ICD-10-CM | POA: Diagnosis present

## 2016-08-06 HISTORY — DX: Chronic obstructive pulmonary disease, unspecified: J44.9

## 2016-08-06 HISTORY — DX: Unspecified atrial fibrillation: I48.91

## 2016-08-06 HISTORY — DX: Other psychoactive substance abuse, uncomplicated: F19.10

## 2016-08-06 LAB — I-STAT ARTERIAL BLOOD GAS, ED
Acid-base deficit: 9 mmol/L — ABNORMAL HIGH (ref 0.0–2.0)
Bicarbonate: 23.3 mmol/L (ref 20.0–28.0)
O2 Saturation: 99 %
PO2 ART: 203 mmHg — AB (ref 83.0–108.0)
Patient temperature: 98.61
TCO2: 26 mmol/L (ref 0–100)
pCO2 arterial: 78.7 mmHg (ref 32.0–48.0)
pH, Arterial: 7.08 — CL (ref 7.350–7.450)

## 2016-08-06 LAB — CBC WITH DIFFERENTIAL/PLATELET
BASOS PCT: 1 %
Basophils Absolute: 0 10*3/uL (ref 0.0–0.1)
Eosinophils Absolute: 0.1 10*3/uL (ref 0.0–0.7)
Eosinophils Relative: 1 %
HEMATOCRIT: 46.4 % (ref 39.0–52.0)
HEMOGLOBIN: 14.5 g/dL (ref 13.0–17.0)
LYMPHS ABS: 4.3 10*3/uL — AB (ref 0.7–4.0)
LYMPHS PCT: 49 %
MCH: 27.6 pg (ref 26.0–34.0)
MCHC: 31.3 g/dL (ref 30.0–36.0)
MCV: 88.2 fL (ref 78.0–100.0)
MONO ABS: 0.5 10*3/uL (ref 0.1–1.0)
MONOS PCT: 6 %
NEUTROS ABS: 3.7 10*3/uL (ref 1.7–7.7)
NEUTROS PCT: 43 %
Platelets: 159 10*3/uL (ref 150–400)
RBC: 5.26 MIL/uL (ref 4.22–5.81)
RDW: 14 % (ref 11.5–15.5)
WBC: 8.7 10*3/uL (ref 4.0–10.5)

## 2016-08-06 LAB — BLOOD GAS, ARTERIAL
Acid-Base Excess: 2.1 mmol/L — ABNORMAL HIGH (ref 0.0–2.0)
BICARBONATE: 25.3 mmol/L (ref 20.0–28.0)
DRAWN BY: 313061
FIO2: 40
LHR: 26 {breaths}/min
MECHVT: 450 mL
O2 Saturation: 97.2 %
PEEP: 5 cmH2O
PO2 ART: 87.7 mmHg (ref 83.0–108.0)
Patient temperature: 98.6
pCO2 arterial: 33.8 mmHg (ref 32.0–48.0)
pH, Arterial: 7.487 — ABNORMAL HIGH (ref 7.350–7.450)

## 2016-08-06 LAB — BASIC METABOLIC PANEL
ANION GAP: 11 (ref 5–15)
BUN: 14 mg/dL (ref 6–20)
CO2: 24 mmol/L (ref 22–32)
Calcium: 8.6 mg/dL — ABNORMAL LOW (ref 8.9–10.3)
Chloride: 104 mmol/L (ref 101–111)
Creatinine, Ser: 1.47 mg/dL — ABNORMAL HIGH (ref 0.61–1.24)
GFR calc Af Amer: 57 mL/min — ABNORMAL LOW (ref >60)
GFR calc non Af Amer: 49 mL/min — ABNORMAL LOW (ref >60)
GLUCOSE: 201 mg/dL — AB (ref 65–99)
POTASSIUM: 4.7 mmol/L (ref 3.5–5.1)
Sodium: 139 mmol/L (ref 135–145)

## 2016-08-06 LAB — RAPID URINE DRUG SCREEN, HOSP PERFORMED
Amphetamines: NOT DETECTED
BARBITURATES: NOT DETECTED
Benzodiazepines: NOT DETECTED
COCAINE: POSITIVE — AB
Opiates: NOT DETECTED
Tetrahydrocannabinol: NOT DETECTED

## 2016-08-06 LAB — TROPONIN I
Troponin I: <0.03 ng/mL (ref <0.03)
Troponin I: 0.03 ng/mL (ref <0.03)
Troponin I: 0.04 ng/mL (ref <0.03)

## 2016-08-06 LAB — MRSA PCR SCREENING: MRSA by PCR: NEGATIVE

## 2016-08-06 LAB — GLUCOSE, CAPILLARY
GLUCOSE-CAPILLARY: 70 mg/dL (ref 65–99)
GLUCOSE-CAPILLARY: 99 mg/dL (ref 65–99)
Glucose-Capillary: 86 mg/dL (ref 65–99)
Glucose-Capillary: 85 mg/dL (ref 65–99)

## 2016-08-06 LAB — HIV ANTIBODY (ROUTINE TESTING W REFLEX): HIV Screen 4th Generation wRfx: NONREACTIVE

## 2016-08-06 LAB — PROTIME-INR
INR: 2.43
PROTHROMBIN TIME: 26.9 s — AB (ref 11.4–15.2)

## 2016-08-06 LAB — BRAIN NATRIURETIC PEPTIDE: B Natriuretic Peptide: 1087.9 pg/mL — ABNORMAL HIGH (ref 0.0–100.0)

## 2016-08-06 LAB — TRIGLYCERIDES: TRIGLYCERIDES: 52 mg/dL (ref <150)

## 2016-08-06 MED ORDER — FUROSEMIDE 10 MG/ML IJ SOLN
40.0000 mg | Freq: Once | INTRAMUSCULAR | Status: AC
  Administered 2016-08-06: 40 mg via INTRAVENOUS
  Filled 2016-08-06: qty 4

## 2016-08-06 MED ORDER — ETOMIDATE 2 MG/ML IV SOLN
INTRAVENOUS | Status: AC | PRN
  Administered 2016-08-06: 20 mg via INTRAVENOUS

## 2016-08-06 MED ORDER — MIDAZOLAM HCL 2 MG/2ML IJ SOLN: 2.0000 mg | INTRAMUSCULAR | Status: DC | PRN

## 2016-08-06 MED ORDER — ASPIRIN 81 MG PO CHEW: 324.0000 mg | CHEWABLE_TABLET | ORAL | Status: AC

## 2016-08-06 MED ORDER — ASPIRIN 300 MG RE SUPP
300.0000 mg | RECTAL | Status: AC
  Administered 2016-08-06: 300 mg via RECTAL
  Filled 2016-08-06: qty 1

## 2016-08-06 MED ORDER — SUCCINYLCHOLINE CHLORIDE 20 MG/ML IJ SOLN
INTRAMUSCULAR | Status: AC | PRN
  Administered 2016-08-06: 100 mg via INTRAVENOUS

## 2016-08-06 MED ORDER — PROPOFOL 1000 MG/100ML IV EMUL
0.0000 ug/kg/min | INTRAVENOUS | Status: DC
  Administered 2016-08-06: 40 ug/kg/min via INTRAVENOUS
  Administered 2016-08-06 – 2016-08-07 (×2): 45 ug/kg/min via INTRAVENOUS
  Filled 2016-08-06 (×2): qty 100
  Filled 2016-08-06: qty 200
  Filled 2016-08-06: qty 100

## 2016-08-06 MED ORDER — PROPOFOL 1000 MG/100ML IV EMUL
5.0000 ug/kg/min | Freq: Once | INTRAVENOUS | Status: AC
  Administered 2016-08-06: 15 ug/kg/min via INTRAVENOUS

## 2016-08-06 MED ORDER — MIDAZOLAM HCL 2 MG/2ML IJ SOLN
2.0000 mg | INTRAMUSCULAR | Status: DC | PRN
  Administered 2016-08-06: 2 mg via INTRAVENOUS
  Filled 2016-08-06: qty 2

## 2016-08-06 MED ORDER — PANTOPRAZOLE SODIUM 40 MG IV SOLR
40.0000 mg | Freq: Every day | INTRAVENOUS | Status: DC
  Administered 2016-08-06: 40 mg via INTRAVENOUS
  Filled 2016-08-06: qty 40

## 2016-08-06 MED ORDER — FUROSEMIDE 10 MG/ML IJ SOLN
40.0000 mg | Freq: Two times a day (BID) | INTRAMUSCULAR | Status: AC
Start: 2016-08-06 — End: 2016-08-08
  Administered 2016-08-06 – 2016-08-08 (×6): 40 mg via INTRAVENOUS
  Filled 2016-08-06 (×6): qty 4

## 2016-08-06 MED ORDER — CHLORHEXIDINE GLUCONATE 0.12% ORAL RINSE (MEDLINE KIT)
15.0000 mL | Freq: Two times a day (BID) | OROMUCOSAL | Status: DC
  Administered 2016-08-06 – 2016-08-09 (×6): 15 mL via OROMUCOSAL

## 2016-08-06 MED ORDER — INSULIN ASPART 100 UNIT/ML ~~LOC~~ SOLN
2.0000 [IU] | SUBCUTANEOUS | Status: DC
  Administered 2016-08-07 (×2): 2 [IU] via SUBCUTANEOUS

## 2016-08-06 MED ORDER — HEPARIN SODIUM (PORCINE) 5000 UNIT/ML IJ SOLN: 5000.0000 [IU] | Freq: Three times a day (TID) | INTRAMUSCULAR | Status: DC

## 2016-08-06 MED ORDER — ORAL CARE MOUTH RINSE
15.0000 mL | OROMUCOSAL | Status: DC
  Administered 2016-08-06 – 2016-08-07 (×12): 15 mL via OROMUCOSAL

## 2016-08-06 NOTE — ED Notes (Signed)
Daughter called and wanted update on Pt. Contact info is below:  Aniceto BossAngela Jones [phone]

## 2016-08-06 NOTE — Progress Notes (Signed)
Pt transported from ED to ICU on vent with no complications.

## 2016-08-06 NOTE — Progress Notes (Signed)
ANTICOAGULATION CONSULT NOTE - Initial Consult  Pharmacy Consult for heparin Indication: atrial fibrillation  Patient Measurements: Height: 6' (182.9 cm) Weight: 154 lb 5.2 oz (70 kg) (estimate by ED) IBW/kg (Calculated) : 77.6 Wt on last admission (07/13/16) ~61kg  Vital Signs: BP: 95/81 (03/29 0630) Pulse Rate: 89 (03/29 0630)  Labs:  Recent Labs  08/06/16 0500  HGB 14.5  HCT 46.4  PLT 159  LABPROT 26.9*  INR 2.43  CREATININE 1.47*  TROPONINI <0.03    Estimated Creatinine Clearance: 50.9 mL/min (A) (by C-G formula based on SCr of 1.47 mg/dL (H)).    Assessment: 66yo male discharged <1184mo ago now presents w/ acute respiratory failure requiring intubation, to transition from Coumadin to heparin for PAF; current INR 2.43, no current plans to reverse.  Goal of Therapy:  Heparin level 0.3-0.7 units/ml Monitor platelets by anticoagulation protocol: Yes   Plan:  Will monitor INR and start heparin when <2.  Vernard GamblesVeronda Billy Rocco, PharmD, BCPS  08/06/2016,6:43 AM

## 2016-08-06 NOTE — ED Triage Notes (Signed)
Patient presents to ed via [facility] , called 911 with sob, ems states patient was sitting on front porch and was able to talk with ems upon their arrival ,  Per ems patient stated he needed to be intubated. Upon arrival to ed obv. resp. Distress using all accessory muscles. Unable to speak.

## 2016-08-06 NOTE — Sedation Documentation (Signed)
Pt intubated by Bebe ShaggyWickline, MD. X-ray called for portable chest x-ray.

## 2016-08-06 NOTE — H&P (Signed)
PULMONARY / CRITICAL CARE MEDICINE   Name: [name] MRN: [id number] DOB: 1951/04/15    ADMISSION DATE:  08/06/2016 CONSULTATION DATE:  08/06/2016  REFERRING MD:  Dr. Bebe Shaggy EDP  CHIEF COMPLAINT:  Shortness of breath  HISTORY OF PRESENT ILLNESS:   66 year old male with past medical history significant for COPD, systolic congestive heart failure with LV EF 20%, and substance abuse including cocaine. He was recently admitted 3/1 to [facility] for acute hypoxemic respiratory failure in the setting of pulmonary edema. He was admitted and treated for acute on chronic systolic heart failure and hypertensive urgency. These were both complicated by the fact that he tested positive for cocaine on admission. He was treated with invasive mechanical ventilation, diuresis, and blood pressure control. He was able to be extubated and eventually discharged.  3/29 in the early a.m. hours she again presents to [facility] emergency department with complaints of dyspnea. Upon EMS arrival to his place of residence he was alert and speaking with the EMS team. He was asking them to intubate him. Upon arrival to the emergency department he was significantly less responsive and was immediately intubated for airway protection. ABG demonstrated a respiratory acidosis and chest x-ray concerning for pulmonary edema. PCCM asked to admit.  PAST MEDICAL HISTORY :  He  has no past medical history on file.  PAST SURGICAL HISTORY: He  has no past surgical history on file.  Allergies not on file  No current facility-administered medications on file prior to encounter.    No current outpatient prescriptions on file prior to encounter.    FAMILY HISTORY:  His has no family status information on file.    SOCIAL HISTORY: He    REVIEW OF SYSTEMS:   Unable as patient is encephalopathic and intubated6  SUBJECTIVE:    VITAL SIGNS: BP 102/80   Pulse 94   Resp 17   Wt 70 kg (154 lb 5.2 oz)   SpO2 100%    HEMODYNAMICS:    VENTILATOR SETTINGS: Vent Mode: PRVC FiO2 (%):  [80 %-100 %] 80 % Set Rate:  [16 bmp-18 bmp] 18 bmp Vt Set:  [450 mL] 450 mL PEEP:  [5 cmH20] 5 cmH20 Plateau Pressure:  [21 cmH20] 21 cmH20  INTAKE / OUTPUT: No intake/output data recorded.  PHYSICAL EXAMINATION: General:  Cachectic appearing male in no acute distress on ventilator Neuro:  Sedated HEENT:  Normocephalic/atraumatic. PERRL, mild to moderate JVD Cardiovascular:  RRR, no MRG. No peripheral edema noted Lungs:  Coarse expiratory wheeze Abdomen:  Soft, nondistended, normoactive bowel sounds Musculoskeletal:  No acute deformity Skin:  Grossly intact  LABS:  BMET  Recent Labs Lab 08/06/16 0500  NA 139  K 4.7  CL 104  CO2 24  BUN 14  CREATININE 1.47*  GLUCOSE 201*    Electrolytes  Recent Labs Lab 08/06/16 0500  CALCIUM 8.6*    CBC  Recent Labs Lab 08/06/16 0500  WBC 8.7  HGB 14.5  HCT 46.4  PLT 159    Coag's  Recent Labs Lab 08/06/16 0500  INR 2.43    Sepsis Markers No results for input(s): LATICACIDVEN, PROCALCITON, O2SATVEN in the last 168 hours.  ABG  Recent Labs Lab 08/06/16 0501  PHART 7.080*  PCO2ART 78.7*  PO2ART 203.0*    Liver Enzymes No results for input(s): AST, ALT, ALKPHOS, BILITOT, ALBUMIN in the last 168 hours.  Cardiac Enzymes  Recent Labs Lab 08/06/16 0500  TROPONINI <0.03    Glucose No results for input(s): GLUCAP in  the last 168 hours.  Imaging Dg Chest Port 1 View  Result Date: 08/06/2016 CLINICAL DATA:  Intubation. EXAM: PORTABLE CHEST 1 VIEW COMPARISON:  Most recent radiograph 07/10/2016 (patient has 2 separate MRN at time of radiograph interpretation) FINDINGS: Endotracheal tube 7.4 cm from the carina. Enteric tube in place, tip below the diaphragm not included in the field of view. Lungs are hyperinflated with emphysema. Cardiomegaly has progressed. Pulmonary edema has progressed. Blunting of the costophrenic angles may be  due to hyperinflation or pleural fluid common pleural fluid is favored on the left. No pneumothorax. No confluent airspace disease. IMPRESSION: 1. Endotracheal tube 7.4 cm from the carina.  Enteric tube in place. 2. CHF with cardiomegaly and pulmonary edema. Probable left pleural effusion. 3. Emphysema. Electronically Signed   By: Rubye OaksMelanie  Ehinger M.D.   On: 08/06/2016 05:19     STUDIES:    CULTURES:   ANTIBIOTICS:   SIGNIFICANT EVENTS: 3/1: Admit for respiratory failure secondary to pulmonary edema 3/29: Admit for respiratory failure secondary to pulmonary edema  LINES/TUBES: Endotracheal tube 3/29>  DISCUSSION:   ASSESSMENT / PLAN:  PULMONARY A: Acute hypoxemic/hypercarbic respiratory failure Pulmonary edema  P:   Full ventilator support Chest x-ray Follow ABG Ventilator associated pneumonia prevention per protocol  CARDIOVASCULAR A:  Acute on chronic systolic congestive heart failure Ischemic cardiomyopathy Paroxysmal atrial fibrillation: Currently normal sinus rhythm P:  Telemetry monitoring Diuresis as tolerated Blood pressure control Trend troponin hold lisinopril, and Coreg as hypotensive on sedation Holding by mouth Lasix in favor of IV  RENAL A:   Acute kidney injury P:   Follow BMP  GASTROINTESTINAL A:   No acute issues P:   Nothing by mouth Protonix IV for stress ulcer prophylaxis  HEMATOLOGIC A:   Chronically anticoagulated with warfarin (Afib) P:  Follow CBC Heparin infusion per pharmacy  INFECTIOUS A:   No acute issues P:   Trend WBC and fever curve  ENDOCRINE A:   Hyperglycemia without history of diabetes   P:   CBG monitoring and sliding scale insulin  NEUROLOGIC A:   Acute metabolic encephalopathy secondary to hypercarbia, acidosis Substance abuse P:   RASS goal: -1 to -2 Propofol infusion, when necessary Versed UDS   FAMILY  - Updates:   - Inter-disciplinary family meet or Palliative Care meeting due by:   4/5    Joneen RoachPaul Allecia Bells, AGACNP-BC [facility] Pager [phone] or [phone]) [phone]  08/06/2016 6:31 AM       08/06/2016, 6:19 AM

## 2016-08-06 NOTE — ED Provider Notes (Signed)
[facility]-EMERGENCY DEPT Provider Note   CSN: [id number] Arrival date & time: 08/06/16  0435     History   Chief Complaint Chief Complaint  Patient presents with  . Shortness of Breath   LEVEL 5 CAVEAT DUE TO ACUITY OF CONDITION  HPI [name] is a 66 y.o. male.  The history is provided by the EMS personnel. The history is limited by the condition of the patient.  Shortness of Breath  This is a new problem. Duration: UNKNOWN. Episode onset: UNKNOWN. The problem has been rapidly worsening.  Patient presents via EMS for shortness of breath Per EMS, pt was asking to be intubated but was awake/alert and speaking En route he became much worse and is now minimally responsive He was hypertensive and hypoxic en route No other details are known    PMH - unknown Soc hx - unknown Home Medications    Prior to Admission medications   Not on File    Family History No family history on file.  Social History Social History  Substance Use Topics  . Smoking status: Not on file  . Smokeless tobacco: Not on file  . Alcohol use Not on file     Allergies   Patient has no allergy information on record.   Review of Systems Review of Systems  Unable to perform ROS: Acuity of condition  Respiratory: Positive for shortness of breath.      Physical Exam Updated Vital Signs BP (!) 120/95   Pulse (!) 112   Resp 20   Wt 70 kg   SpO2 99%  Initial pulse ox - 88%on non-rebreather Physical Exam CONSTITUTIONAL: ill appearing, cachectic, distress HEAD: Normocephalic/atraumatic EYES: PERRL ENMT: Mucous membranes moist NECK - +JVD CV: tachycardic, no loud murmurs LUNGS: crackles noted bilaterally, tachypneic, respiratory distress noted ABDOMEN: soft, nontender NEURO: Pt is distressed.  He is minimally responsive to voice.  GCS = 4 EXTREMITIES: pulses normal/equal, full ROM SKIN: warm, color normal PSYCH: unable to assess  ED Treatments / Results  Labs (all labs ordered  are listed, but only abnormal results are displayed) Labs Reviewed  BASIC METABOLIC PANEL - Abnormal; Notable for the following:       Result Value   Glucose, Bld 201 (*)    Creatinine, Ser 1.47 (*)    Calcium 8.6 (*)    GFR calc non Af Amer 49 (*)    GFR calc Af Amer 57 (*)    All other components within normal limits  CBC WITH DIFFERENTIAL/PLATELET - Abnormal; Notable for the following:    Lymphs Abs 4.3 (*)    All other components within normal limits  BRAIN NATRIURETIC PEPTIDE - Abnormal; Notable for the following:    B Natriuretic Peptide 1,087.9 (*)    All other components within normal limits  PROTIME-INR - Abnormal; Notable for the following:    Prothrombin Time 26.9 (*)    All other components within normal limits  I-STAT ARTERIAL BLOOD GAS, ED - Abnormal; Notable for the following:    pH, Arterial 7.080 (*)    pCO2 arterial 78.7 (*)    pO2, Arterial 203.0 (*)    Acid-base deficit 9.0 (*)    All other components within normal limits  TROPONIN I  RAPID URINE DRUG SCREEN, HOSP PERFORMED  HIV ANTIBODY (ROUTINE TESTING)  BLOOD GAS, ARTERIAL  TROPONIN I  TROPONIN I  RAPID URINE DRUG SCREEN, HOSP PERFORMED  TRIGLYCERIDES    EKG  EKG Interpretation  Date/Time:  Thursday August 06 2016  04:45:52 EDT Ventricular Rate:  140 PR Interval:    QRS Duration: 185 QT Interval:  398 QTC Calculation: 608 R Axis:   118 Text Interpretation:  Sinus tachycardia RBBB and LPFB Confirmed by Bebe Shaggy  MD, Maryama Kuriakose ([id number]) on 08/06/2016 5:05:28 AM       Radiology Dg Chest Port 1 View  Result Date: 08/06/2016 CLINICAL DATA:  Intubation. EXAM: PORTABLE CHEST 1 VIEW COMPARISON:  Most recent radiograph 07/10/2016 (patient has 2 separate MRN at time of radiograph interpretation) FINDINGS: Endotracheal tube 7.4 cm from the carina. Enteric tube in place, tip below the diaphragm not included in the field of view. Lungs are hyperinflated with emphysema. Cardiomegaly has progressed. Pulmonary  edema has progressed. Blunting of the costophrenic angles may be due to hyperinflation or pleural fluid common pleural fluid is favored on the left. No pneumothorax. No confluent airspace disease. IMPRESSION: 1. Endotracheal tube 7.4 cm from the carina.  Enteric tube in place. 2. CHF with cardiomegaly and pulmonary edema. Probable left pleural effusion. 3. Emphysema. Electronically Signed   By: Rubye Oaks M.D.   On: 08/06/2016 05:19    Procedures Procedure Name: Intubation Date/Time: 08/06/2016 5:55 AM Performed by: Zadie Rhine Pre-anesthesia Checklist: Patient identified, Emergency Drugs available, Suction available and Patient being monitored Oxygen Delivery Method: Non-rebreather mask Preoxygenation: Pre-oxygenation with 100% oxygen Intubation Type: Rapid sequence Ventilation: Mask ventilation without difficulty Laryngoscope Size: Glidescope Grade View: Grade I Tube size: 7.5 mm Number of attempts: 1 Airway Equipment and Method: Stylet and Video-laryngoscopy Placement Confirmation: ETT inserted through vocal cords under direct vision,  CO2 detector and Breath sounds checked- equal and bilateral       CRITICAL CARE Performed by: Joya Gaskins Total critical care time: 40 minutes Critical care time was exclusive of separately billable procedures and treating other patients. Critical care was necessary to treat or prevent imminent or life-threatening deterioration. Critical care was time spent personally by me on the following activities: development of treatment plan with patient and/or surrogate as well as nursing, discussions with consultants, evaluation of patient's response to treatment, examination of patient, obtaining history from patient or surrogate, ordering and performing treatments and interventions, ordering and review of laboratory studies, ordering and review of radiographic studies, pulse oximetry and re-evaluation of patient's condition.   Medications  Ordered in ED Medications  pantoprazole (PROTONIX) injection 40 mg (not administered)  aspirin chewable tablet 324 mg (not administered)    Or  aspirin suppository 300 mg (not administered)  furosemide (LASIX) injection 40 mg (not administered)  insulin aspart (novoLOG) injection 2-6 Units (not administered)  propofol (DIPRIVAN) 1000 MG/100ML infusion (not administered)  midazolam (VERSED) injection 2 mg (not administered)  midazolam (VERSED) injection 2 mg (not administered)  etomidate (AMIDATE) injection (20 mg Intravenous Given 08/06/16 0442)  succinylcholine (ANECTINE) injection (100 mg Intravenous Given 08/06/16 0442)  propofol (DIPRIVAN) 1000 MG/100ML infusion (35 mcg/kg/min  70 kg Intravenous Rate/Dose Change 08/06/16 0631)  furosemide (LASIX) injection 40 mg (40 mg Intravenous Given 08/06/16 0528)     Initial Impression / Assessment and Plan / ED Course  I have reviewed the triage vital signs and the nursing notes.  Pertinent labs & imaging results that were available during my care of the patient were reviewed by me and considered in my medical decision making (see chart for details).     Pt seen on arrival for respiratory distress He was altered, and hypoxic Pt intubate without dificulty He is now stabilized I spoke to critical care for admission  6:49 AM Pt stabilized in the ED He is responsive to voice He will be admitted to ICU BP 98/76 (BP Location: Left Arm)   Pulse 89   Resp 19   Ht 6' (1.829 m)   Wt 70 kg   SpO2 93%   BMI 20.93 kg/m   Final Clinical Impressions(s) / ED Diagnoses   Final diagnoses:  Acute respiratory failure with hypercapnia (HCC)  Acute pulmonary edema (HCC)    New Prescriptions New Prescriptions   No medications on file     Zadie Rhine, MD 08/06/16 [phone]

## 2016-08-06 NOTE — Progress Notes (Signed)
ET tube advanced to 25cm at the lip per MD order.

## 2016-08-06 NOTE — Care Management Note (Signed)
Case Management Note  Patient Details  Name: Micheline RoughMelvin Tseng MRN: [id number] Date of Birth: 08/25/1950  Subjective/Objective: Pt admitted on 08/06/16 with acute respiratory failure due to pulmonary edema.  PTA, pt resided at home with daughter.                     Action/Plan: Pt currently remains intubated; will follow for discharge planning as pt progresses.    Expected Discharge Date:                  Expected Discharge Plan:     In-House Referral:     Discharge planning Services  CM Consult  Post Acute Care Choice:    Choice offered to:     DME Arranged:    DME Agency:     HH Arranged:    HH Agency:     Status of Service:  In process, will continue to follow  If discussed at Long Length of Stay Meetings, dates discussed:    Additional Comments:  Glennon Macmerson, Kristianna Saperstein M, RN 08/06/2016, 4:41 PM

## 2016-08-07 ENCOUNTER — Inpatient Hospital Stay (HOSPITAL_COMMUNITY): Payer: Medicare Other

## 2016-08-07 DIAGNOSIS — J811 Chronic pulmonary edema: Secondary | ICD-10-CM

## 2016-08-07 DIAGNOSIS — J9690 Respiratory failure, unspecified, unspecified whether with hypoxia or hypercapnia: Secondary | ICD-10-CM

## 2016-08-07 DIAGNOSIS — J9601 Acute respiratory failure with hypoxia: Secondary | ICD-10-CM

## 2016-08-07 DIAGNOSIS — J810 Acute pulmonary edema: Secondary | ICD-10-CM

## 2016-08-07 LAB — BLOOD GAS, ARTERIAL
ACID-BASE EXCESS: 2 mmol/L (ref 0.0–2.0)
BICARBONATE: 24.9 mmol/L (ref 20.0–28.0)
Drawn by: 270271
FIO2: 40
LHR: 26 {breaths}/min
MECHVT: 450 mL
O2 Saturation: 98.1 %
PATIENT TEMPERATURE: 98.6
PCO2 ART: 31.3 mmHg — AB (ref 32.0–48.0)
PEEP/CPAP: 5 cmH2O
PO2 ART: 104 mmHg (ref 83.0–108.0)
pH, Arterial: 7.513 — ABNORMAL HIGH (ref 7.350–7.450)

## 2016-08-07 LAB — CBC
HEMATOCRIT: 40.9 % (ref 39.0–52.0)
Hemoglobin: 13.3 g/dL (ref 13.0–17.0)
MCH: 27.4 pg (ref 26.0–34.0)
MCHC: 32.5 g/dL (ref 30.0–36.0)
MCV: 84.3 fL (ref 78.0–100.0)
Platelets: 146 10*3/uL — ABNORMAL LOW (ref 150–400)
RBC: 4.85 MIL/uL (ref 4.22–5.81)
RDW: 13.8 % (ref 11.5–15.5)
WBC: 8 10*3/uL (ref 4.0–10.5)

## 2016-08-07 LAB — PROTIME-INR
INR: 2.15
Prothrombin Time: 24.3 seconds — ABNORMAL HIGH (ref 11.4–15.2)

## 2016-08-07 LAB — BASIC METABOLIC PANEL
Anion gap: 11 (ref 5–15)
BUN: 15 mg/dL (ref 6–20)
CALCIUM: 8.6 mg/dL — AB (ref 8.9–10.3)
CO2: 25 mmol/L (ref 22–32)
CREATININE: 1.41 mg/dL — AB (ref 0.61–1.24)
Chloride: 103 mmol/L (ref 101–111)
GFR calc non Af Amer: 52 mL/min — ABNORMAL LOW (ref >60)
GFR, EST AFRICAN AMERICAN: 60 mL/min — AB (ref >60)
Glucose, Bld: 107 mg/dL — ABNORMAL HIGH (ref 65–99)
Potassium: 3.6 mmol/L (ref 3.5–5.1)
Sodium: 139 mmol/L (ref 135–145)

## 2016-08-07 LAB — GLUCOSE, CAPILLARY
GLUCOSE-CAPILLARY: 88 mg/dL (ref 65–99)
GLUCOSE-CAPILLARY: 121 mg/dL — AB (ref 65–99)
GLUCOSE-CAPILLARY: 64 mg/dL — AB (ref 65–99)
Glucose-Capillary: 115 mg/dL — ABNORMAL HIGH (ref 65–99)
Glucose-Capillary: 126 mg/dL — ABNORMAL HIGH (ref 65–99)
Glucose-Capillary: 94 mg/dL (ref 65–99)

## 2016-08-07 LAB — PHOSPHORUS: PHOSPHORUS: 3.4 mg/dL (ref 2.5–4.6)

## 2016-08-07 LAB — LACTIC ACID, PLASMA: LACTIC ACID, VENOUS: 1.1 mmol/L (ref 0.5–1.9)

## 2016-08-07 LAB — TROPONIN I: Troponin I: 0.04 ng/mL (ref <0.03)

## 2016-08-07 LAB — MAGNESIUM: Magnesium: 1.8 mg/dL (ref 1.7–2.4)

## 2016-08-07 MED ORDER — BUDESONIDE 0.25 MG/2ML IN SUSP
0.2500 mg | Freq: Four times a day (QID) | RESPIRATORY_TRACT | Status: DC
  Administered 2016-08-07 (×2): 0.25 mg via RESPIRATORY_TRACT
  Filled 2016-08-07 (×2): qty 2

## 2016-08-07 MED ORDER — FAMOTIDINE 20 MG PO TABS
20.0000 mg | ORAL_TABLET | Freq: Every day | ORAL | Status: DC
  Administered 2016-08-07 – 2016-08-08 (×2): 20 mg via ORAL
  Filled 2016-08-07 (×2): qty 1

## 2016-08-07 MED ORDER — MAGNESIUM SULFATE 2 GM/50ML IV SOLN
2.0000 g | Freq: Once | INTRAVENOUS | Status: AC
  Administered 2016-08-07: 2 g via INTRAVENOUS
  Filled 2016-08-07: qty 50

## 2016-08-07 MED ORDER — IPRATROPIUM-ALBUTEROL 0.5-2.5 (3) MG/3ML IN SOLN
3.0000 mL | Freq: Four times a day (QID) | RESPIRATORY_TRACT | Status: DC
  Administered 2016-08-07 – 2016-08-09 (×8): 3 mL via RESPIRATORY_TRACT
  Filled 2016-08-07 (×7): qty 3
  Filled 2016-08-07: qty 39

## 2016-08-07 MED ORDER — BUDESONIDE 0.25 MG/2ML IN SUSP
0.2500 mg | Freq: Four times a day (QID) | RESPIRATORY_TRACT | Status: DC
  Administered 2016-08-08 – 2016-08-09 (×6): 0.25 mg via RESPIRATORY_TRACT
  Filled 2016-08-07 (×6): qty 2

## 2016-08-07 NOTE — Progress Notes (Signed)
ANTICOAGULATION CONSULT NOTE  Pharmacy Consult for heparin Indication: atrial fibrillation  Patient Measurements: Height: 6' (182.9 cm) Weight: 154 lb 5.2 oz (70 kg) (estimate by ED) IBW/kg (Calculated) : 77.6 Wt on last admission (07/13/16) ~61kg  Vital Signs: Temp: 98.3 F (36.8 C) (03/30 0400) Temp Source: Axillary (03/30 0400) BP: 121/97 (03/30 0700) Pulse Rate: 91 (03/30 0700)  Labs:  Recent Labs  08/06/16 0500 08/06/16 1241 08/06/16 1902 08/07/16 0024  HGB 14.5  --   --  13.3  HCT 46.4  --   --  40.9  PLT 159  --   --  146*  LABPROT 26.9*  --   --  24.3*  INR 2.43  --   --  2.15  CREATININE 1.47*  --   --  1.41*  TROPONINI <0.03 0.03* 0.04* 0.04*    Estimated Creatinine Clearance: 45.1 mL/min (A) (by C-G formula based on SCr of 1.41 mg/dL (H)).    Assessment: 66yo male discharged <68mo ago now presents w/ acute respiratory failure requiring intubation, to transition from Coumadin to heparin for PAF when INR<2; current INR down to 2.15, no current plans to reverse.  Goal of Therapy:  Heparin level 0.3-0.7 units/ml Monitor platelets by anticoagulation protocol: Yes   Plan:  Will monitor INR and start heparin when <2, likely tomorrow  Babs Bertin, PharmD, BCPS Clinical Pharmacist 08/07/2016 7:30 AM

## 2016-08-07 NOTE — Progress Notes (Signed)
PULMONARY / CRITICAL CARE MEDICINE   Name: [name] MRN: [id number] DOB: [birth year]    ADMISSION DATE:  08/06/2016 CONSULTATION DATE:  08/06/2016  REFERRING MD:  Dr. Bebe Shaggy EDP  CHIEF COMPLAINT:  Shortness of breath  HISTORY OF PRESENT ILLNESS:   66 year old male with past medical history significant for COPD, systolic congestive heart failure with LV EF 20%, and substance abuse including cocaine. He was recently admitted 3/1 to [facility] for acute hypoxemic respiratory failure in the setting of pulmonary edema. He was admitted and treated for acute on chronic systolic heart failure and hypertensive urgency. These were both complicated by the fact that he tested positive for cocaine on admission. He was treated with invasive mechanical ventilation, diuresis, and blood pressure control. He was able to be extubated and eventually discharged.  3/29 in the early a.m. hours she again presents to [facility] emergency department with complaints of dyspnea. Upon EMS arrival to his place of residence he was alert and speaking with the EMS team. He was asking them to intubate him. Upon arrival to the emergency department he was significantly less responsive and was immediately intubated for airway protection. ABG demonstrated a respiratory acidosis and chest x-ray concerning for pulmonary edema. PCCM asked to admit.    SUBJECTIVE:  Heavily sedated  VITAL SIGNS: BP (!) 121/97   Pulse 91   Temp 98.3 F (36.8 C) (Axillary)   Resp 20   Ht  (1.778 m)   Wt 59.4 kg (130 lb 15.3 oz)   SpO2 99%   BMI 18.79 kg/m   HEMODYNAMICS:    VENTILATOR SETTINGS: Vent Mode: PRVC FiO2 (%):  [40 %] 40 % Set Rate:  [26 bmp] 26 bmp Vt Set:  [45 mL-450 mL] 450 mL PEEP:  [5 cmH20] 5 cmH20 Plateau Pressure:  [13 cmH20-17 cmH20] 17 cmH20  INTAKE / OUTPUT: I/O last 3 completed shifts: In: 433.4 [I.V.:433.4] Out: 3150 [Urine:3150]  PHYSICAL EXAMINATION: General:  Cachectic appearing male in no  acute distress on ventilator Neuro:  Sedated, heavily HEENT:  Normocephalic/atraumatic. PERRL,no jvd Cardiovascular:  RRR, no MRG. HSR Lungs:  Mild rhonchi, decreased in bases Abdomen:  Soft, nondistended, +bs Musculoskeletal: no edema Skin:  Warm and dry  LABS:  BMET  Recent Labs Lab 08/06/16 0500 08/07/16 0024  NA 139 139  K 4.7 3.6  CL 104 103  CO2 24 25  BUN 14 15  CREATININE 1.47* 1.41*  GLUCOSE 201* 107*    Electrolytes  Recent Labs Lab 08/06/16 0500 08/07/16 0024  CALCIUM 8.6* 8.6*  MG  --  1.8  PHOS  --  3.4    CBC  Recent Labs Lab 08/06/16 0500 08/07/16 0024  WBC 8.7 8.0  HGB 14.5 13.3  HCT 46.4 40.9  PLT 159 146*    Coag's  Recent Labs Lab 08/06/16 0500 08/07/16 0024  INR 2.43 2.15    Sepsis Markers  Recent Labs Lab 08/07/16 0024  LATICACIDVEN 1.1    ABG  Recent Labs Lab 08/06/16 0501 08/06/16 1550 08/07/16 0325  PHART 7.080* 7.487* 7.513*  PCO2ART 78.7* 33.8 31.3*  PO2ART 203.0* 87.7 104    Liver Enzymes No results for input(s): AST, ALT, ALKPHOS, BILITOT, ALBUMIN in the last 168 hours.  Cardiac Enzymes  Recent Labs Lab 08/06/16 1241 08/06/16 1902 08/07/16 0024  TROPONINI 0.03* 0.04* 0.04*    Glucose  Recent Labs Lab 08/06/16 1126 08/06/16 1559 08/06/16 1942 08/07/16 0000 08/07/16 0334 08/07/16 0724  GLUCAP 85 70 99 115* 126*  88    Imaging Dg Chest Port 1 View  Result Date: 08/07/2016 CLINICAL DATA:  Respiratory difficulty and pulmonary edema EXAM: PORTABLE CHEST 1 VIEW COMPARISON:  08/06/2016 FINDINGS: Cardiac shadow is stable. Endotracheal tube and nasogastric catheter are again noted in satisfactory position. The lungs are well aerated bilaterally. There has been near complete resolution of the previously seen vascular congestion and interstitial edema. Minimal right basilar atelectasis is noted. No pneumothorax is seen. IMPRESSION: Minimal right basilar atelectasis. Electronically Signed   By:  Alcide Clever M.D.   On: 08/07/2016 07:41     STUDIES:    CULTURES:   ANTIBIOTICS:   SIGNIFICANT EVENTS: 3/1: Admit for respiratory failure secondary to pulmonary edema 3/29: Admit for respiratory failure secondary to pulmonary edema  LINES/TUBES: Endotracheal tube 3/29>  DISCUSSION: 3/30 cxr improved with negative I/o. Pul edema probably form cocaine.  ASSESSMENT / PLAN:  PULMONARY A: Acute hypoxemic/hypercarbic respiratory failure Pulmonary edema  P:   Full ventilator support Chest x-ray improved 3/30 Ventilator associated pneumonia prevention per protocol, doubt pna with overnight cxr clearing Decrease sedation and do SBT  CARDIOVASCULAR  Intake/Output Summary (Last 24 hours) at 08/07/16 0800 Last data filed at 08/07/16 0600  Gross per 24 hour  Intake           433.41 ml  Output             2650 ml  Net         -2216.59 ml    A:  Acute on chronic systolic congestive heart failure Ischemic cardiomyopathy Paroxysmal atrial fibrillation: Currently normal sinus rhythm P:  Telemetry monitoring Diuresis as tolerated, note negative I/o 3/30 with K+ adequate, see I/o and cxr above Blood pressure control Trend troponin hold lisinopril, and Coreg as hypotensive on sedation Holding by mouth Lasix in favor of IV  RENAL Lab Results  Component Value Date   CREATININE 1.41 (H) 08/07/2016   CREATININE 1.47 (H) 08/06/2016    Recent Labs Lab 08/06/16 0500 08/07/16 0024  K 4.7 3.6     A:   Acute kidney injury P:   Follow BMP, treat as needed  GASTROINTESTINAL A:   No acute issues P:   Nothing by mouth Protonix IV for stress ulcer prophylaxis 3/30 if not extubated will start TF's  HEMATOLOGIC Lab Results  Component Value Date   INR 2.15 08/07/2016   INR 2.43 08/06/2016   A:   Chronically anticoagulated with warfarin (Afib) P:  Follow CBC Heparin infusion per pharmacy when inr <2.0  INFECTIOUS A:   No acute issues P:   Trend WBC and  fever curve  ENDOCRINE CBG (last 3)   Recent Labs  08/07/16 0000 08/07/16 0334 08/07/16 0724  GLUCAP 115* 126* 88     A:   Hyperglycemia without history of diabetes   P:   CBG monitoring and sliding scale insulin 3/30 well controlled  NEUROLOGIC A:   Acute metabolic encephalopathy secondary to hypercarbia, acidosis Substance abuse P:   RASS goal: -1 to -2 Propofol infusion, when necessary Versed UDS + cocaine  Wean sedation 3/30 for neuro eval and vent weaning   FAMILY  - Updates: 3/30 no family at bedside  - Inter-disciplinary family meet or Palliative Care meeting due by:  4/5  App cct 30 min  Brett Canales Chaske Paskett ACNP [facility] PCCM Pager [phone] till 3 pm If no answer page [phone] 08/07/2016, 7:55 AM

## 2016-08-08 ENCOUNTER — Inpatient Hospital Stay (HOSPITAL_COMMUNITY): Payer: Medicare Other

## 2016-08-08 LAB — CBC
HCT: 42 % (ref 39.0–52.0)
HEMOGLOBIN: 13.6 g/dL (ref 13.0–17.0)
MCH: 27.4 pg (ref 26.0–34.0)
MCHC: 32.4 g/dL (ref 30.0–36.0)
MCV: 84.7 fL (ref 78.0–100.0)
Platelets: 137 10*3/uL — ABNORMAL LOW (ref 150–400)
RBC: 4.96 MIL/uL (ref 4.22–5.81)
RDW: 13.9 % (ref 11.5–15.5)
WBC: 6.8 10*3/uL (ref 4.0–10.5)

## 2016-08-08 LAB — BASIC METABOLIC PANEL
ANION GAP: 11 (ref 5–15)
BUN: 20 mg/dL (ref 6–20)
CHLORIDE: 99 mmol/L — AB (ref 101–111)
CO2: 27 mmol/L (ref 22–32)
Calcium: 8.7 mg/dL — ABNORMAL LOW (ref 8.9–10.3)
Creatinine, Ser: 1.32 mg/dL — ABNORMAL HIGH (ref 0.61–1.24)
GFR calc Af Amer: >60 mL/min (ref >60)
GFR, EST NON AFRICAN AMERICAN: 56 mL/min — AB (ref >60)
GLUCOSE: 154 mg/dL — AB (ref 65–99)
POTASSIUM: 2.7 mmol/L — AB (ref 3.5–5.1)
Sodium: 137 mmol/L (ref 135–145)

## 2016-08-08 LAB — PHOSPHORUS: PHOSPHORUS: 3.2 mg/dL (ref 2.5–4.6)

## 2016-08-08 LAB — MAGNESIUM: MAGNESIUM: 2.1 mg/dL (ref 1.7–2.4)

## 2016-08-08 LAB — PROTIME-INR
INR: 1.57
PROTHROMBIN TIME: 18.9 s — AB (ref 11.4–15.2)

## 2016-08-08 LAB — TROPONIN I: Troponin I: 0.03 ng/mL (ref <0.03)

## 2016-08-08 LAB — HEPARIN LEVEL (UNFRACTIONATED)

## 2016-08-08 LAB — APTT: aPTT: 46 seconds — ABNORMAL HIGH (ref 24–36)

## 2016-08-08 MED ORDER — POTASSIUM CHLORIDE CRYS ER 20 MEQ PO TBCR
30.0000 meq | EXTENDED_RELEASE_TABLET | ORAL | Status: AC
  Administered 2016-08-08 (×2): 30 meq via ORAL
  Filled 2016-08-08 (×2): qty 1

## 2016-08-08 MED ORDER — WARFARIN - PHARMACIST DOSING INPATIENT: Freq: Every day | Status: DC

## 2016-08-08 MED ORDER — POTASSIUM CHLORIDE CRYS ER 20 MEQ PO TBCR
20.0000 meq | EXTENDED_RELEASE_TABLET | Freq: Once | ORAL | Status: AC
  Administered 2016-08-08: 20 meq via ORAL
  Filled 2016-08-08: qty 1

## 2016-08-08 MED ORDER — WARFARIN SODIUM 5 MG PO TABS
5.0000 mg | ORAL_TABLET | Freq: Once | ORAL | Status: AC
  Administered 2016-08-08: 5 mg via ORAL
  Filled 2016-08-08: qty 1

## 2016-08-08 MED ORDER — HEPARIN (PORCINE) IN NACL 100-0.45 UNIT/ML-% IJ SOLN
1050.0000 [IU]/h | INTRAMUSCULAR | Status: DC
  Administered 2016-08-08: 750 [IU]/h via INTRAVENOUS
  Administered 2016-08-09: 1050 [IU]/h via INTRAVENOUS
  Filled 2016-08-08 (×3): qty 250

## 2016-08-08 MED ORDER — CARVEDILOL 3.125 MG PO TABS
3.1250 mg | ORAL_TABLET | Freq: Two times a day (BID) | ORAL | Status: DC
  Administered 2016-08-08 – 2016-08-09 (×3): 3.125 mg via ORAL
  Filled 2016-08-08 (×3): qty 1

## 2016-08-08 NOTE — Progress Notes (Signed)
PROGRESS NOTE    [name]  GDJ:[id number] DOB: [birth year] DOA: 08/06/2016 PCP: [facility]    Brief Narrative:  Patient who presented to the hospital after developing respiratory failure and required intubation with full vent support and Lasix likely secondary to flash pulmonary edema secondary to cocaine use.  Assessment & Plan:   Active Problems:   Acute respiratory failure with hypercapnia (HCC) - resolved  Hypokalemia - replace and reassess - suspect secondary to lasix administration    Acute pulmonary edema ([place]) - resolved. Pt is on lasix 40 mg IV bid. Not on lasix at home - will discontinue lasix starting next am.    Respiratory failure ([place]) - resolved pt is s/p extubation  Afib - will restart coumadin and continue heparin bridging - BP soft, will however add home B blocker regimen with holding parameters.  DVT prophylaxis: Heparin Code Status: Full Family Communication: none Disposition Plan: d/c next am with improvement in condition   Consultants:   None   Procedures: intubation   Antimicrobials: None   Subjective: Pt has no new complaints, no acute issues overnight.  Objective: Vitals:   08/08/16 0555 08/08/16 0901 08/08/16 1300 08/08/16 1453  BP: (!) 87/52  106/75   Pulse: 83  72   Resp:   16   Temp: 98.7 F (37.1 C)  97.6 F (36.4 C)   TempSrc: Oral  Oral   SpO2: 94% 92% 99% 98%  Weight:      Height:        Intake/Output Summary (Last 24 hours) at 08/08/16 1737 Last data filed at 08/08/16 1144  Gross per 24 hour  Intake              360 ml  Output             2150 ml  Net            -1790 ml   Filed Weights   08/06/16 0450 08/06/16 0755 08/07/16 0400  Weight: 70 kg (154 lb 5.2 oz) 63.6 kg (140 lb 3.4 oz) 59.4 kg (130 lb 15.3 oz)    Examination:  General exam: Appears calm and comfortable, in nad. Respiratory system: Clear to auscultation. Respiratory effort normal. Cardiovascular system: S1  & S2 heard, RRR. No murmurs Gastrointestinal system: Abdomen is nondistended, soft and nontender. No organomegaly or masses felt. Normal bowel sounds heard. Central nervous system: Alert and oriented. No focal neurological deficits. Extremities: Symmetric 5 x 5 power. Skin: No rashes, lesions or ulcers, on limited exam Psychiatry: Mood & affect appropriate.   Data Reviewed: I have personally reviewed following labs and imaging studies  CBC:  Recent Labs Lab 08/06/16 0500 08/07/16 0024 08/08/16 0450  WBC 8.7 8.0 6.8  NEUTROABS 3.7  --   --   HGB 14.5 13.3 13.6  HCT 46.4 40.9 42.0  MCV 88.2 84.3 84.7  PLT 159 146* 622*   Basic Metabolic Panel:  Recent Labs Lab 08/06/16 0500 08/07/16 0024 08/08/16 0450  NA 139 139 137  K 4.7 3.6 2.7*  CL 104 103 99*  CO2 _0 GLUCOSE 201* 107* 154*  BUN _1 CREATININE 1.47* 1.41* 1.32*  CALCIUM 8.6* 8.6* 8.7*  MG  --  1.8 2.1  PHOS  --  3.4 3.2   GFR: Estimated Creatinine Clearance: 48.1 mL/min (A) (by C-G formula based on SCr of 1.32 mg/dL (H)). Liver Function Tests: No results for input(s): AST, ALT, ALKPHOS, BILITOT,  PROT, ALBUMIN in the last 168 hours. No results for input(s): LIPASE, AMYLASE in the last 168 hours. No results for input(s): AMMONIA in the last 168 hours. Coagulation Profile:  Recent Labs Lab 08/06/16 0500 08/07/16 0024 08/08/16 0450  INR 2.43 2.15 1.57   Cardiac Enzymes:  Recent Labs Lab 08/06/16 0500 08/06/16 1241 08/06/16 1902 08/07/16 0024 08/08/16 0450  TROPONINI <0.03 0.03* 0.04* 0.04* 0.03*   BNP (last 3 results) No results for input(s): PROBNP in the last 8760 hours. HbA1C: No results for input(s): HGBA1C in the last 72 hours. CBG:  Recent Labs Lab 08/07/16 0334 08/07/16 0724 08/07/16 1115 08/07/16 1559 08/07/16 1650  GLUCAP 126* 88 121* 64* 94   Lipid Profile:  Recent Labs  08/06/16 0717  TRIG 52   Thyroid Function Tests: No results for input(s): TSH,  T4TOTAL, FREET4, T3FREE, THYROIDAB in the last 72 hours. Anemia Panel: No results for input(s): VITAMINB12, FOLATE, FERRITIN, TIBC, IRON, RETICCTPCT in the last 72 hours. Sepsis Labs:  Recent Labs Lab 08/07/16 0024  LATICACIDVEN 1.1    Recent Results (from the past 240 hour(s))  MRSA PCR Screening     Status: None   Collection Time: 08/06/16  8:31 AM  Result Value Ref Range Status   MRSA by PCR NEGATIVE NEGATIVE Final    Comment:        The GeneXpert MRSA Assay (FDA approved for NASAL specimens only), is one component of a comprehensive MRSA colonization surveillance program. It is not intended to diagnose MRSA infection nor to guide or monitor treatment for MRSA infections.      Radiology Studies: Dg Chest Port 1 View  Result Date: 08/08/2016 CLINICAL DATA:  Respiratory failure EXAM: PORTABLE CHEST 1 VIEW COMPARISON:  August 07, 2016 FINDINGS: The cardiomediastinal silhouette is stable. Interstitial markings are greater on the right than the left. No focal infiltrate. Prominent skin fold seen on the left. The patient is rotated. No other interval changes. IMPRESSION: 1. Increased interstitial markings on the right versus the left may be due to patient rotation. Mild asymmetric edema or vascular congestion not completely excluded. Recommend attention on follow-up. No other changes. Electronically Signed   By: Dorise Bullion III M.D   On: 08/08/2016 07:42   Dg Chest Port 1 View  Result Date: 08/07/2016 CLINICAL DATA:  Respiratory difficulty and pulmonary edema EXAM: PORTABLE CHEST 1 VIEW COMPARISON:  08/06/2016 FINDINGS: Cardiac shadow is stable. Endotracheal tube and nasogastric catheter are again noted in satisfactory position. The lungs are well aerated bilaterally. There has been near complete resolution of the previously seen vascular congestion and interstitial edema. Minimal right basilar atelectasis is noted. No pneumothorax is seen. IMPRESSION: Minimal right basilar  atelectasis. Electronically Signed   By: Inez Catalina M.D.   On: 08/07/2016 07:41   Scheduled Meds: . budesonide (PULMICORT) nebulizer solution  0.25 mg Nebulization Q6H  . chlorhexidine gluconate (MEDLINE KIT)  15 mL Mouth Rinse BID  . famotidine  20 mg Oral QHS  . furosemide  40 mg Intravenous BID  . ipratropium-albuterol  3 mL Nebulization Q6H   Continuous Infusions: . heparin 950 Units/hr (08/08/16 1624)     LOS: 2 days    Time spent: > 35 minutes  Velvet Bathe, MD [facility] Pager [phone]  If 7PM-7AM, please contact night-coverage [email or web address] Password [facility] 08/08/2016, 5:37 PM

## 2016-08-08 NOTE — Progress Notes (Signed)
CRITICAL VALUE ALERT  Critical value received:  Potassium 2.7  Date of notification:  08/08/16  Time of notification:  0604  Critical value read back:Yes.    Nurse who received alert:  Dayna Ramus, RN  MD notified (1st page):  M. Lynch   Time of first page:  0605  MD notified (2nd page):  Time of second page:  Responding MD:  M. Burnadette Peter  Time MD responded:  [phone]

## 2016-08-08 NOTE — Progress Notes (Signed)
ANTICOAGULATION CONSULT NOTE  Pharmacy Consult for heparin Indication: atrial fibrillation  Patient Measurements: Height: 5'10" Weight: 60 kg IBW/kg (Calculated) : 73 kg  Vital Signs: Temp: 98.7 F (37.1 C) (03/31 0555) Temp Source: Oral (03/31 0555) BP: 87/52 (03/31 0555) Pulse Rate: 83 (03/31 0555)  Assessment: 65yo male on Coumadin  PO daily PTA for PAF. Holding Coumadin on admit and transitioning to heparin once INR < 2. INR this morning dropped to 1.57. Hgb stable, plts low at 137. No s/s of bleed.  Goal of Therapy:  Heparin level 0.3-0.7 units/ml Monitor platelets by anticoagulation protocol: Yes   Plan:  Start heparin gtt at 750 units/hr Monitor daily heparin level, CBC, s/s of bleed  Enzo Bi, PharmD, [facility] Clinical Pharmacist Pager [phone] 08/08/2016 7:10 AM

## 2016-08-08 NOTE — Progress Notes (Addendum)
ANTICOAGULATION CONSULT NOTE  Pharmacy Consult for heparin Indication: atrial fibrillation  Patient Measurements: Height: 5'10" Weight: 60 kg IBW/kg (Calculated) : 73 kg  Vital Signs: Temp: 97.6 F (36.4 C) (03/31 1300) Temp Source: Oral (03/31 1300) BP: 106/75 (03/31 1300) Pulse Rate: 72 (03/31 1300)  Assessment: 66yo male on Coumadin  PO daily PTA for PAF. Holding Coumadin on admit and transitioning to heparin once INR < 2. INR this morning dropped to 1.57. Hgb stable, plts low at 137. No s/s of bleed.  Initial heparin level is subtherapeutic. No issues with infusion or sxs of bleeding.   Goal of Therapy:  Heparin level 0.3-0.7 units/ml Monitor platelets by anticoagulation protocol: Yes   Plan:  Increase heparin gtt to 950 units/hr Heparin level in 8 hours  Monitor daily heparin level, CBC, s/s of bleed  Pollyann Samples, PharmD, BCPS 08/08/2016, 4:26 PM     Addendum -Starting warfarin -Warfarin 5 mg po x1 -Daily INR   Baldemar Friday  08/08/2016 6:17 PM

## 2016-08-09 LAB — MAGNESIUM: MAGNESIUM: 2 mg/dL (ref 1.7–2.4)

## 2016-08-09 LAB — PROTIME-INR
INR: 1.22
PROTHROMBIN TIME: 15.5 s — AB (ref 11.4–15.2)

## 2016-08-09 LAB — CBC
HEMATOCRIT: 40.5 % (ref 39.0–52.0)
Hemoglobin: 13.2 g/dL (ref 13.0–17.0)
MCH: 27.4 pg (ref 26.0–34.0)
MCHC: 32.6 g/dL (ref 30.0–36.0)
MCV: 84.2 fL (ref 78.0–100.0)
PLATELETS: 139 10*3/uL — AB (ref 150–400)
RBC: 4.81 MIL/uL (ref 4.22–5.81)
RDW: 14 % (ref 11.5–15.5)
WBC: 5.9 10*3/uL (ref 4.0–10.5)

## 2016-08-09 LAB — HEPARIN LEVEL (UNFRACTIONATED)
HEPARIN UNFRACTIONATED: 0.22 [IU]/mL — AB (ref 0.30–0.70)
HEPARIN UNFRACTIONATED: 0.33 [IU]/mL (ref 0.30–0.70)

## 2016-08-09 LAB — POTASSIUM: POTASSIUM: 4.2 mmol/L (ref 3.5–5.1)

## 2016-08-09 LAB — PHOSPHORUS: PHOSPHORUS: 3.3 mg/dL (ref 2.5–4.6)

## 2016-08-09 MED ORDER — WARFARIN SODIUM 5 MG PO TABS
7.5000 mg | ORAL_TABLET | Freq: Once | ORAL | Status: AC
  Administered 2016-08-09: 7.5 mg via ORAL
  Filled 2016-08-09: qty 2

## 2016-08-09 NOTE — Progress Notes (Signed)
Pt discharged to home.  Pt given copy of DC instructions and explained.  Pt given 1800 meds for today and instructed to follow up with doctor at [facility] for coumadin dosing and for follow up.  Pt understands the importance of quitting smoking and cocaine use.  No questions about home self care.

## 2016-08-09 NOTE — Discharge Summary (Signed)
Physician Discharge Summary  [name] WJX:[id number] DOB: 1951/03/04 DOA: 08/06/2016  PCP: [facility]  Admit date: 08/06/2016 Discharge date: 08/09/2016  Time spent: > 35 minutes  Recommendations for Outpatient Follow-up:  1. Pt has been counseled on discontinuing cocaine use. Will continue B blocker at this juncture  2. Monitor INR levels. Patient did not wish to stay in the hospital until his INR was therapeutic   Discharge Diagnoses:  Active Problems:   Acute respiratory failure with hypercapnia (HCC)   Acute pulmonary edema (HCC)   Respiratory failure (HCC)   Discharge Condition: stable  Diet recommendation: heart healthy  Filed Weights   08/06/16 0755 08/07/16 0400 08/09/16 0432  Weight: 63.6 kg (140 lb 3.4 oz) 59.4 kg (130 lb 15.3 oz) 62.5 kg (137 lb 12.6 oz)    History of present illness:  Patient who presented to the hospital after developing respiratory failure and required intubation with full vent support and Lasix likely secondary to flash pulmonary edema secondary to cocaine use.  Hospital Course:  Active Problems:   Acute respiratory failure with hypercapnia (HCC) - resolved  Hypokalemia - resolved after replacement.    Acute pulmonary edema (HCC) - resolved. Pt is on lasix 40 mg IV bid. Not on lasix at home - will discontinue lasix starting next am.    Respiratory failure (HCC) - resolved pt is s/p extubation  Afib - Continue coumadin, Pt requested discharge despite his INR not being at target, he refused to stay until INR was within target. - continue B blocker   Procedures:  None  Consultations:  Critical care team  Discharge Exam: Vitals:   08/09/16 0432 08/09/16 1433  BP: (!) 100/58 98/65  Pulse: 62 70  Resp: 16 16  Temp: 98.3 F (36.8 C) 97.7 F (36.5 C)    General: Pt in nad, alert and awake Cardiovascular: no cyanosis Respiratory: no increased wob  Discharge Instructions   Discharge  Instructions    Diet - low sodium heart healthy    Complete by:  As directed    Discharge instructions    Complete by:  As directed    Please continue to abstain from cocaine. Will continue your B blocker. Follow up with your primary care physician in 1-2 weeks or sooner should any new concerns arise.   Increase activity slowly    Complete by:  As directed      Current Discharge Medication List    CONTINUE these medications which have NOT CHANGED   Details  albuterol (PROVENTIL HFA;VENTOLIN HFA) 108 (90 Base) MCG/ACT inhaler Inhale 1 puff into the lungs every 6 (six) hours as needed for wheezing or shortness of breath.    carvedilol (COREG) 3.125 MG tablet Take 3.125 mg by mouth 2 (two) times daily with a meal.    lisinopril (PRINIVIL,ZESTRIL) 2.5 MG tablet Take 2.5 mg by mouth daily.    potassium chloride (K-DUR,KLOR-CON) 10 MEQ tablet Take 10 mEq by mouth daily.    warfarin (COUMADIN) 5 MG tablet Take 5 mg by mouth daily.       Not on File    The results of significant diagnostics from this hospitalization (including imaging, microbiology, ancillary and laboratory) are listed below for reference.    Significant Diagnostic Studies: Dg Chest Port 1 View  Result Date: 08/08/2016 CLINICAL DATA:  Respiratory failure EXAM: PORTABLE CHEST 1 VIEW COMPARISON:  August 07, 2016 FINDINGS: The cardiomediastinal silhouette is stable. Interstitial markings are greater on the right than the  left. No focal infiltrate. Prominent skin fold seen on the left. The patient is rotated. No other interval changes. IMPRESSION: 1. Increased interstitial markings on the right versus the left may be due to patient rotation. Mild asymmetric edema or vascular congestion not completely excluded. Recommend attention on follow-up. No other changes. Electronically Signed   By: Gerome Sam III M.D   On: 08/08/2016 07:42   Dg Chest Port 1 View  Result Date: 08/07/2016 CLINICAL DATA:  Respiratory difficulty and  pulmonary edema EXAM: PORTABLE CHEST 1 VIEW COMPARISON:  08/06/2016 FINDINGS: Cardiac shadow is stable. Endotracheal tube and nasogastric catheter are again noted in satisfactory position. The lungs are well aerated bilaterally. There has been near complete resolution of the previously seen vascular congestion and interstitial edema. Minimal right basilar atelectasis is noted. No pneumothorax is seen. IMPRESSION: Minimal right basilar atelectasis. Electronically Signed   By: Alcide Clever M.D.   On: 08/07/2016 07:41   Dg Chest Port 1 View  Result Date: 08/06/2016 CLINICAL DATA:  Intubation. EXAM: PORTABLE CHEST 1 VIEW COMPARISON:  Most recent radiograph 07/10/2016 (patient has 2 separate MRN at time of radiograph interpretation) FINDINGS: Endotracheal tube 7.4 cm from the carina. Enteric tube in place, tip below the diaphragm not included in the field of view. Lungs are hyperinflated with emphysema. Cardiomegaly has progressed. Pulmonary edema has progressed. Blunting of the costophrenic angles may be due to hyperinflation or pleural fluid common pleural fluid is favored on the left. No pneumothorax. No confluent airspace disease. IMPRESSION: 1. Endotracheal tube 7.4 cm from the carina.  Enteric tube in place. 2. CHF with cardiomegaly and pulmonary edema. Probable left pleural effusion. 3. Emphysema. Electronically Signed   By: Rubye Oaks M.D.   On: 08/06/2016 05:19    Microbiology: Recent Results (from the past 240 hour(s))  MRSA PCR Screening     Status: None   Collection Time: 08/06/16  8:31 AM  Result Value Ref Range Status   MRSA by PCR NEGATIVE NEGATIVE Final    Comment:        The GeneXpert MRSA Assay (FDA approved for NASAL specimens only), is one component of a comprehensive MRSA colonization surveillance program. It is not intended to diagnose MRSA infection nor to guide or monitor treatment for MRSA infections.      Labs: Basic Metabolic Panel:  Recent Labs Lab  08/06/16 0500 08/07/16 0024 08/08/16 0450 08/09/16 0006 08/09/16 1514  NA 139 139 137  --   --   K 4.7 3.6 2.7*  --  4.2  CL 104 103 99*  --   --   CO2 --   --   GLUCOSE 201* 107* 154*  --   --   BUN --   --   CREATININE 1.47* 1.41* 1.32*  --   --   CALCIUM 8.6* 8.6* 8.7*  --   --   MG  --  1.8 2.1 2.0  --   PHOS  --  3.4 3.2 3.3  --    Liver Function Tests: No results for input(s): AST, ALT, ALKPHOS, BILITOT, PROT, ALBUMIN in the last 168 hours. No results for input(s): LIPASE, AMYLASE in the last 168 hours. No results for input(s): AMMONIA in the last 168 hours. CBC:  Recent Labs Lab 08/06/16 0500 08/07/16 0024 08/08/16 0450 08/09/16 0006  WBC 8.7 8.0 6.8 5.9  NEUTROABS 3.7  --   --   --   HGB 14.5 13.3 13.6  13.2  HCT 46.4 40.9 42.0 40.5  MCV 88.2 84.3 84.7 84.2  PLT 159 146* 137* 139*   Cardiac Enzymes:  Recent Labs Lab 08/06/16 0500 08/06/16 1241 08/06/16 1902 08/07/16 0024 08/08/16 0450  TROPONINI <0.03 0.03* 0.04* 0.04* 0.03*   BNP: BNP (last 3 results)  Recent Labs  08/06/16 0500  BNP 1,087.9*    ProBNP (last 3 results) No results for input(s): PROBNP in the last 8760 hours.  CBG:  Recent Labs Lab 08/07/16 0334 08/07/16 0724 08/07/16 1115 08/07/16 1559 08/07/16 1650  GLUCAP 126* 88 121* 64* 94       Signed:  Penny Pia MD.  [facility] 08/09/2016, 5:13 PM

## 2016-08-09 NOTE — Progress Notes (Signed)
PROGRESS NOTE    [name]  XNT:[id number] DOB: Feb 09, 1951 DOA: 08/06/2016 PCP: [facility]    Brief Narrative:  Patient who presented to the hospital after developing respiratory failure and required intubation with full vent support and Lasix likely secondary to flash pulmonary edema secondary to cocaine use.  Assessment & Plan:   Active Problems:   Acute respiratory failure with hypercapnia (HCC) - resolved  Hypokalemia - Awaiting repeat levels - replace if necessary     Acute pulmonary edema ([place]) - resolved. Pt is on lasix 40 mg IV bid. Not on lasix at home - will discontinue lasix starting next am.    Respiratory failure ([place]) - resolved pt is s/p extubation  Afib - will restart coumadin and continue heparin bridging - continue B blocker regimen with holding parameters.  DVT prophylaxis: Heparin Code Status: Full Family Communication: none Disposition Plan: d/c next am with improvement in condition   Consultants:   None   Procedures: intubation   Antimicrobials: None   Subjective: Pt has no new complaints.  Objective: Vitals:   08/09/16 0432 08/09/16 0848 08/09/16 1433 08/09/16 1521  BP: (!) 100/58  98/65   Pulse: 62  70   Resp: 16  16   Temp: 98.3 F (36.8 C)  97.7 F (36.5 C)   TempSrc: Oral  Oral   SpO2: 97% 96% 98% 97%  Weight: 62.5 kg (137 lb 12.6 oz)     Height:        Intake/Output Summary (Last 24 hours) at 08/09/16 1529 Last data filed at 08/09/16 1300  Gross per 24 hour  Intake          1116.33 ml  Output             1500 ml  Net          -383.67 ml   Filed Weights   08/06/16 0755 08/07/16 0400 08/09/16 0432  Weight: 63.6 kg (140 lb 3.4 oz) 59.4 kg (130 lb 15.3 oz) 62.5 kg (137 lb 12.6 oz)    Examination:  General exam: Appears calm and comfortable, in nad. Respiratory system: Clear to auscultation. Respiratory effort normal. Cardiovascular system: S1 & S2 heard, RRR. No  murmurs Gastrointestinal system: Abdomen is nondistended, soft and nontender. No organomegaly or masses felt. Normal bowel sounds heard. Central nervous system: Alert and oriented. No focal neurological deficits. Extremities: Symmetric 5 x 5 power. Skin: No rashes, lesions or ulcers, on limited exam Psychiatry: Mood & affect appropriate.   Data Reviewed: I have personally reviewed following labs and imaging studies  CBC:  Recent Labs Lab 08/06/16 0500 08/07/16 0024 08/08/16 0450 08/09/16 0006  WBC 8.7 8.0 6.8 5.9  NEUTROABS 3.7  --   --   --   HGB 14.5 13.3 13.6 13.2  HCT 46.4 40.9 42.0 40.5  MCV 88.2 84.3 84.7 84.2  PLT 159 146* 137* 967*   Basic Metabolic Panel:  Recent Labs Lab 08/06/16 0500 08/07/16 0024 08/08/16 0450 08/09/16 0006  NA 139 139 137  --   K 4.7 3.6 2.7*  --   CL 104 103 99*  --   CO2 _0 --   GLUCOSE 201* 107* 154*  --   BUN _1 --   CREATININE 1.47* 1.41* 1.32*  --   CALCIUM 8.6* 8.6* 8.7*  --   MG  --  1.8 2.1 2.0  PHOS  --  3.4 3.2 3.3   GFR:  Estimated Creatinine Clearance: 50.6 mL/min (A) (by C-G formula based on SCr of 1.32 mg/dL (H)). Liver Function Tests: No results for input(s): AST, ALT, ALKPHOS, BILITOT, PROT, ALBUMIN in the last 168 hours. No results for input(s): LIPASE, AMYLASE in the last 168 hours. No results for input(s): AMMONIA in the last 168 hours. Coagulation Profile:  Recent Labs Lab 08/06/16 0500 08/07/16 0024 08/08/16 0450 08/09/16 0006  INR 2.43 2.15 1.57 1.22   Cardiac Enzymes:  Recent Labs Lab 08/06/16 0500 08/06/16 1241 08/06/16 1902 08/07/16 0024 08/08/16 0450  TROPONINI <0.03 0.03* 0.04* 0.04* 0.03*   BNP (last 3 results) No results for input(s): PROBNP in the last 8760 hours. HbA1C: No results for input(s): HGBA1C in the last 72 hours. CBG:  Recent Labs Lab 08/07/16 0334 08/07/16 0724 08/07/16 1115 08/07/16 1559 08/07/16 1650  GLUCAP 126* 88 121* 64* 94   Lipid  Profile: No results for input(s): CHOL, HDL, LDLCALC, TRIG, CHOLHDL, LDLDIRECT in the last 72 hours. Thyroid Function Tests: No results for input(s): TSH, T4TOTAL, FREET4, T3FREE, THYROIDAB in the last 72 hours. Anemia Panel: No results for input(s): VITAMINB12, FOLATE, FERRITIN, TIBC, IRON, RETICCTPCT in the last 72 hours. Sepsis Labs:  Recent Labs Lab 08/07/16 0024  LATICACIDVEN 1.1    Recent Results (from the past 240 hour(s))  MRSA PCR Screening     Status: None   Collection Time: 08/06/16  8:31 AM  Result Value Ref Range Status   MRSA by PCR NEGATIVE NEGATIVE Final    Comment:        The GeneXpert MRSA Assay (FDA approved for NASAL specimens only), is one component of a comprehensive MRSA colonization surveillance program. It is not intended to diagnose MRSA infection nor to guide or monitor treatment for MRSA infections.      Radiology Studies: Dg Chest Port 1 View  Result Date: 08/08/2016 CLINICAL DATA:  Respiratory failure EXAM: PORTABLE CHEST 1 VIEW COMPARISON:  August 07, 2016 FINDINGS: The cardiomediastinal silhouette is stable. Interstitial markings are greater on the right than the left. No focal infiltrate. Prominent skin fold seen on the left. The patient is rotated. No other interval changes. IMPRESSION: 1. Increased interstitial markings on the right versus the left may be due to patient rotation. Mild asymmetric edema or vascular congestion not completely excluded. Recommend attention on follow-up. No other changes. Electronically Signed   By: Dorise Bullion III M.D   On: 08/08/2016 07:42   Scheduled Meds: . budesonide (PULMICORT) nebulizer solution  0.25 mg Nebulization Q6H  . carvedilol  3.125 mg Oral BID WC  . chlorhexidine gluconate (MEDLINE KIT)  15 mL Mouth Rinse BID  . famotidine  20 mg Oral QHS  . ipratropium-albuterol  3 mL Nebulization Q6H  . warfarin  7.5 mg Oral ONCE-1800  . Warfarin - Pharmacist Dosing Inpatient   Does not apply q1800    Continuous Infusions: . heparin 1,050 Units/hr (08/09/16 0713)     LOS: 3 days    Time spent: > 35 minutes  Velvet Bathe, MD [facility] Pager [phone]  If 7PM-7AM, please contact night-coverage [email or web address] Password [email or web address] 08/09/2016, 3:29 PM

## 2016-08-09 NOTE — Progress Notes (Signed)
ANTICOAGULATION CONSULT NOTE - Follow Up Consult  Pharmacy Consult for Heparin (while INR is <2) Indication: atrial fibrillation  Patient Measurements: Height:  (177.8 cm) Weight: 130 lb 15.3 oz (59.4 kg) IBW/kg (Calculated) : 73  Vital Signs: Temp: 97.7 F (36.5 C) (03/31 2026) Temp Source: Oral (03/31 2026) BP: 108/68 (03/31 2026) Pulse Rate: 69 (03/31 2026)  Labs:  Recent Labs  08/06/16 0500  08/06/16 1902 08/07/16 0024 08/08/16 0450 08/08/16 1358 08/09/16 0006 08/09/16 0010  HGB 14.5  --   --  13.3 13.6  --  13.2  --   HCT 46.4  --   --  40.9 42.0  --  40.5  --   PLT 159  --   --  146* 137*  --  139*  --   APTT  --   --   --   --  46*  --   --   --   LABPROT 26.9*  --   --  24.3* 18.9*  --   --   --   INR 2.43  --   --  2.15 1.57  --   --   --   HEPARINUNFRC  --   --   --   --   --  <0.10*  --  0.22*  CREATININE 1.47*  --   --  1.41* 1.32*  --   --   --   TROPONINI <0.03  < > 0.04* 0.04* 0.03*  --   --   --   < > = values in this interval not displayed.  Estimated Creatinine Clearance: 48.1 mL/min (A) (by C-G formula based on SCr of 1.32 mg/dL (H)).  Assessment: On heparin while INR is sub-therapeutic, heparin level is low this AM, no issues per RN.   Goal of Therapy:  Heparin level 0.3-0.7 units/ml Monitor platelets by anticoagulation protocol: Yes   Plan:  Inc heparin to 1050 units/hr 0900 HL  Abran Duke 08/09/2016,12:38 AM

## 2016-08-09 NOTE — Progress Notes (Signed)
ANTICOAGULATION CONSULT NOTE - Follow Up Consult  Pharmacy Consult for Heparin (while INR is <2) / Coumadin Indication: atrial fibrillation  Patient Measurements: Height:  (177.8 cm) Weight: 137 lb 12.6 oz (62.5 kg) IBW/kg (Calculated) : 73  Vital Signs: Temp: 98.3 F (36.8 C) (04/01 0432) Temp Source: Oral (04/01 0432) BP: 100/58 (04/01 0432) Pulse Rate: 62 (04/01 0432)  Assessment: 66 yo M on Coumadin  PO daily PTA for PAF. Holding Coumadin on admit and transitioning to heparin once INR < 2. Last heparin level therapeutic at 0.33 after last rate increase. INR down to 1.22 today. Hgb stable, plts low at 139. No s/s of bleed.  Goal of Therapy:  Heparin level 0.3-0.7 units/ml Monitor platelets by anticoagulation protocol: Yes   Plan:  Continue heparin gtt at 1,050 units/hr Give Coumadin 7.5mg  PO x 1 tonight Monitor daily heparin level / INR, CBC, s/s of bleed  Enzo Bi, PharmD, [facility] Clinical Pharmacist Pager [phone] 08/09/2016 10:49 AM

## 2016-08-10 ENCOUNTER — Encounter (HOSPITAL_COMMUNITY): Payer: Self-pay

## 2016-10-03 ENCOUNTER — Encounter (HOSPITAL_COMMUNITY): Payer: Self-pay | Admitting: Emergency Medicine

## 2016-10-03 ENCOUNTER — Inpatient Hospital Stay (HOSPITAL_COMMUNITY)
Admission: EM | Admit: 2016-10-03 | Discharge: 2016-10-05 | DRG: 291 | Disposition: A | Discharge status: Home / Self Care | Payer: Medicare Other | Attending: Internal Medicine | Admitting: Internal Medicine

## 2016-10-03 ENCOUNTER — Emergency Department (HOSPITAL_COMMUNITY): Payer: Medicare Other

## 2016-10-03 DIAGNOSIS — Z87891 Personal history of nicotine dependence: Secondary | ICD-10-CM

## 2016-10-03 DIAGNOSIS — Z96652 Presence of left artificial knee joint: Secondary | ICD-10-CM | POA: Diagnosis present

## 2016-10-03 DIAGNOSIS — Z7901 Long term (current) use of anticoagulants: Secondary | ICD-10-CM

## 2016-10-03 DIAGNOSIS — I482 Chronic atrial fibrillation: Secondary | ICD-10-CM | POA: Diagnosis present

## 2016-10-03 DIAGNOSIS — F1910 Other psychoactive substance abuse, uncomplicated: Secondary | ICD-10-CM | POA: Diagnosis present

## 2016-10-03 DIAGNOSIS — I509 Heart failure, unspecified: Secondary | ICD-10-CM

## 2016-10-03 DIAGNOSIS — Z9111 Patient's noncompliance with dietary regimen: Secondary | ICD-10-CM

## 2016-10-03 DIAGNOSIS — R12 Heartburn: Secondary | ICD-10-CM | POA: Diagnosis present

## 2016-10-03 DIAGNOSIS — I110 Hypertensive heart disease with heart failure: Principal | ICD-10-CM | POA: Diagnosis present

## 2016-10-03 DIAGNOSIS — I5023 Acute on chronic systolic (congestive) heart failure: Secondary | ICD-10-CM | POA: Diagnosis present

## 2016-10-03 DIAGNOSIS — J9601 Acute respiratory failure with hypoxia: Secondary | ICD-10-CM | POA: Diagnosis present

## 2016-10-03 DIAGNOSIS — R0602 Shortness of breath: Secondary | ICD-10-CM

## 2016-10-03 DIAGNOSIS — I2510 Atherosclerotic heart disease of native coronary artery without angina pectoris: Secondary | ICD-10-CM | POA: Diagnosis present

## 2016-10-03 DIAGNOSIS — Z9114 Patient's other noncompliance with medication regimen: Secondary | ICD-10-CM

## 2016-10-03 DIAGNOSIS — Z79899 Other long term (current) drug therapy: Secondary | ICD-10-CM

## 2016-10-03 DIAGNOSIS — Z8673 Personal history of transient ischemic attack (TIA), and cerebral infarction without residual deficits: Secondary | ICD-10-CM

## 2016-10-03 DIAGNOSIS — R6883 Chills (without fever): Secondary | ICD-10-CM | POA: Diagnosis present

## 2016-10-03 DIAGNOSIS — J449 Chronic obstructive pulmonary disease, unspecified: Secondary | ICD-10-CM | POA: Diagnosis present

## 2016-10-03 DIAGNOSIS — Z7982 Long term (current) use of aspirin: Secondary | ICD-10-CM

## 2016-10-03 DIAGNOSIS — I10 Essential (primary) hypertension: Secondary | ICD-10-CM | POA: Diagnosis present

## 2016-10-03 DIAGNOSIS — Z9119 Patient's noncompliance with other medical treatment and regimen: Secondary | ICD-10-CM

## 2016-10-03 DIAGNOSIS — F1410 Cocaine abuse, uncomplicated: Secondary | ICD-10-CM | POA: Diagnosis present

## 2016-10-03 HISTORY — DX: Heartburn: R12

## 2016-10-03 LAB — I-STAT ARTERIAL BLOOD GAS, ED
ACID-BASE DEFICIT: 5 mmol/L — AB (ref 0.0–2.0)
Bicarbonate: 19.7 mmol/L — ABNORMAL LOW (ref 20.0–28.0)
O2 SAT: 98 %
PH ART: 7.381 (ref 7.350–7.450)
PO2 ART: 97 mmHg (ref 83.0–108.0)
Patient temperature: 97.7
TCO2: 21 mmol/L (ref 0–100)
pCO2 arterial: 33.1 mmHg (ref 32.0–48.0)

## 2016-10-03 LAB — BASIC METABOLIC PANEL
ANION GAP: 12 (ref 5–15)
BUN: 17 mg/dL (ref 6–20)
CO2: 16 mmol/L — ABNORMAL LOW (ref 22–32)
Calcium: 8.8 mg/dL — ABNORMAL LOW (ref 8.9–10.3)
Chloride: 109 mmol/L (ref 101–111)
Creatinine, Ser: 1.12 mg/dL (ref 0.61–1.24)
Glucose, Bld: 100 mg/dL — ABNORMAL HIGH (ref 65–99)
POTASSIUM: 4.6 mmol/L (ref 3.5–5.1)
SODIUM: 137 mmol/L (ref 135–145)

## 2016-10-03 LAB — CBC
HEMATOCRIT: 40.6 % (ref 39.0–52.0)
Hemoglobin: 13.1 g/dL (ref 13.0–17.0)
MCH: 26.8 pg (ref 26.0–34.0)
MCHC: 32.3 g/dL (ref 30.0–36.0)
MCV: 83.2 fL (ref 78.0–100.0)
Platelets: 218 10*3/uL (ref 150–400)
RBC: 4.88 MIL/uL (ref 4.22–5.81)
RDW: 14.4 % (ref 11.5–15.5)
WBC: 7.5 10*3/uL (ref 4.0–10.5)

## 2016-10-03 LAB — I-STAT TROPONIN, ED: TROPONIN I, POC: 0.06 ng/mL (ref 0.00–0.08)

## 2016-10-03 LAB — TROPONIN I: TROPONIN I: 0.04 ng/mL — AB (ref <0.03)

## 2016-10-03 LAB — BRAIN NATRIURETIC PEPTIDE: B NATRIURETIC PEPTIDE 5: 2670 pg/mL — AB (ref 0.0–100.0)

## 2016-10-03 LAB — PROTIME-INR
INR: 2.13
PROTHROMBIN TIME: 24.1 s — AB (ref 11.4–15.2)

## 2016-10-03 MED ORDER — ALBUTEROL SULFATE (2.5 MG/3ML) 0.083% IN NEBU: 2.5000 mg | INHALATION_SOLUTION | RESPIRATORY_TRACT | Status: DC | PRN

## 2016-10-03 MED ORDER — WARFARIN - PHARMACIST DOSING INPATIENT
Freq: Every day | Status: DC
  Administered 2016-10-03: 1

## 2016-10-03 MED ORDER — ALBUTEROL SULFATE (2.5 MG/3ML) 0.083% IN NEBU
5.0000 mg | INHALATION_SOLUTION | Freq: Once | RESPIRATORY_TRACT | Status: AC
  Administered 2016-10-03: 5 mg via RESPIRATORY_TRACT
  Filled 2016-10-03: qty 6

## 2016-10-03 MED ORDER — ONDANSETRON HCL 4 MG/2ML IJ SOLN: 4.0000 mg | Freq: Four times a day (QID) | INTRAMUSCULAR | Status: DC | PRN

## 2016-10-03 MED ORDER — FUROSEMIDE 10 MG/ML IJ SOLN
40.0000 mg | INTRAMUSCULAR | Status: AC
  Administered 2016-10-03: 40 mg via INTRAVENOUS
  Filled 2016-10-03: qty 4

## 2016-10-03 MED ORDER — ACETAMINOPHEN 325 MG PO TABS: 650.0000 mg | ORAL_TABLET | ORAL | Status: DC | PRN

## 2016-10-03 MED ORDER — IPRATROPIUM-ALBUTEROL 0.5-2.5 (3) MG/3ML IN SOLN
3.0000 mL | Freq: Four times a day (QID) | RESPIRATORY_TRACT | Status: DC
  Administered 2016-10-03 (×2): 3 mL via RESPIRATORY_TRACT
  Filled 2016-10-03 (×2): qty 3

## 2016-10-03 MED ORDER — WARFARIN SODIUM 5 MG PO TABS
5.0000 mg | ORAL_TABLET | Freq: Every day | ORAL | Status: AC
  Administered 2016-10-03: 5 mg via ORAL
  Filled 2016-10-03: qty 1

## 2016-10-03 MED ORDER — IPRATROPIUM BROMIDE 0.02 % IN SOLN
0.5000 mg | Freq: Once | RESPIRATORY_TRACT | Status: AC
  Administered 2016-10-03: 0.5 mg via RESPIRATORY_TRACT
  Filled 2016-10-03: qty 2.5

## 2016-10-03 MED ORDER — FUROSEMIDE 10 MG/ML IJ SOLN
40.0000 mg | Freq: Two times a day (BID) | INTRAMUSCULAR | Status: AC
  Administered 2016-10-03 – 2016-10-04 (×2): 40 mg via INTRAVENOUS
  Filled 2016-10-03 (×2): qty 4

## 2016-10-03 MED ORDER — GI COCKTAIL ~~LOC~~: 30.0000 mL | Freq: Four times a day (QID) | ORAL | Status: DC | PRN

## 2016-10-03 MED ORDER — IPRATROPIUM-ALBUTEROL 0.5-2.5 (3) MG/3ML IN SOLN
3.0000 mL | Freq: Three times a day (TID) | RESPIRATORY_TRACT | Status: DC
  Administered 2016-10-04: 3 mL via RESPIRATORY_TRACT
  Filled 2016-10-03: qty 3

## 2016-10-03 MED ORDER — HYDRALAZINE HCL 20 MG/ML IJ SOLN: 10.0000 mg | Freq: Three times a day (TID) | INTRAMUSCULAR | Status: DC | PRN

## 2016-10-03 MED ORDER — METHYLPREDNISOLONE SODIUM SUCC 125 MG IJ SOLR
125.0000 mg | Freq: Once | INTRAMUSCULAR | Status: AC
  Administered 2016-10-03: 125 mg via INTRAVENOUS
  Filled 2016-10-03: qty 2

## 2016-10-03 NOTE — ED Notes (Signed)
Report called to 3E

## 2016-10-03 NOTE — ED Notes (Signed)
Patient states he is feeling much better.

## 2016-10-03 NOTE — Progress Notes (Signed)
ANTICOAGULATION CONSULT NOTE - Initial Consult  Pharmacy Consult for Warfarin Indication: atrial fibrillation  No Known Allergies  Patient Measurements: Height: 6\' 1"  (185.4 cm) Weight: 127 lb 11.2 oz (57.9 kg) IBW/kg (Calculated) : 79.9 Heparin Dosing Weight: 75 kg  Vital Signs: Temp: 97.3 F (36.3 C) (05/26 1115) Temp Source: Oral (05/26 1115) BP: 132/99 (05/26 1115) Pulse Rate: 86 (05/26 1115)  Labs:  Recent Labs  10/03/16 0700 10/03/16 0738 10/03/16 1334  HGB 13.1  --   --   HCT 40.6  --   --   PLT 218  --   --   LABPROT  --  24.1*  --   INR  --  2.13  --   CREATININE 1.12  --   --   TROPONINI  --   --  0.04*    Estimated Creatinine Clearance: 55.3 mL/min (by C-G formula based on SCr of 1.12 mg/dL).   Medical History: Past Medical History:  Diagnosis Date  . Atrial fibrillation (HCC)   . CAD (coronary artery disease)    nonobstructive  . CHF (congestive heart failure) (HCC)   . Chronic systolic heart failure (HCC)   . COPD (chronic obstructive pulmonary disease) (HCC)   . Drug abuse    cocaine  . Heartburn   . Hypertension   . Stroke (HCC)   . Substance abuse     Assessment: [age] YOM on warfarin PTA for hx Afib/CVA who presented on 5/26 with SOB. Pharmacy consulted to resume warfarin dosing this admission. Admit INR 2.13 on PTA dose. Last dose PTA on 5/25.   Goal of Therapy:  INR 2-3   Plan:  1. Resume warfarin 5 mg daily 2. Will continue to monitor for any signs/symptoms of bleeding and will follow up with PT/INR in the a.m.   Thank you for allowing pharmacy to be a part of this patient's care.  Georgina PillionElizabeth Silas Muff, PharmD, BCPS Clinical Pharmacist Pager: [phone] 10/03/2016 5:52 PM

## 2016-10-03 NOTE — H&P (Signed)
[facility] History and Physical  ABRAR BILTON ZOX:[id number] DOB: [birth year] DOA: 10/03/2016  Referring physician:  PCP: [facility]   Chief Complaint: "I don't know what happened."  HPI: THESEUS BIRNIE is a 66 y.o. male  with past medical history significant for atrial relation, coronary disease, heart failure, COPD, cocaine use current, hypertension and stroke presents to the hospital with chief complaint shortness of breath. Patient recently did cocaine in the last 48 hours. Within the 48 hours patient developed shortness of breath. He has been coughing up "white stuff". Patient denies sick contacts. He is noncompliant with his medications.  ED course: Patient found to be hypoxic on room air into the high 80s. Patient is a cannula. Given Lasix for diuresis. Also given a breathing treatment. O2 saturation better on nasal cannula. Hospitalist consulted for admission. Chest x-ray did show fluid overload and BNP above baseline for patient.   Review of Systems:  As per HPI otherwise 10 point review of systems negative.    Past Medical History:  Diagnosis Date  . Atrial fibrillation (HCC)   . CAD (coronary artery disease)    nonobstructive  . CHF (congestive heart failure) (HCC)   . Chronic systolic heart failure (HCC)   . COPD (chronic obstructive pulmonary disease) (HCC)   . Drug abuse    cocaine  . Hypertension   . Stroke (HCC)   . Substance abuse    Past Surgical History:  Procedure Laterality Date  . JOINT REPLACEMENT  L knee   Social History:  reports that he quit smoking about 4 weeks ago. His smoking use included Cigarettes. He has never used smokeless tobacco. He reports that he drinks alcohol. He reports that he uses drugs, including Cocaine, about 1 time per week.  No Known Allergies  Family History  Problem Relation Age of Onset  . Malignant hyperthermia Mother      Prior to Admission medications   Medication Sig Start  Date End Date Taking? Authorizing Provider  albuterol (PROVENTIL HFA;VENTOLIN HFA) 108 (90 Base) MCG/ACT inhaler Inhale 1 puff into the lungs every 6 (six) hours as needed for wheezing or shortness of breath.    [provider]  Albuterol Sulfate 108 (90 Base) MCG/ACT AEPB Inhale 1 puff into the lungs every 6 (six) hours as needed. Patient taking differently: Inhale 1 puff into the lungs every 6 (six) hours as needed (wheezing/shortness of breath).  02/01/16   Burns, Tinnie Gens, MD  aspirin 81 MG tablet Take 1 tablet (81 mg total) by mouth daily. Patient taking differently: Take 81 mg by mouth every other day.  02/01/16   Burns, Tinnie Gens, MD  atorvastatin (LIPITOR) 40 MG tablet Take 1 tablet (40 mg total) by mouth daily at 6 PM. 07/13/16   Arrien, York Ram, MD  carvedilol (COREG) 3.125 MG tablet Take 3.125 mg by mouth 2 (two) times daily with a meal.    [provider]  carvedilol (COREG) 6.25 MG tablet Take 3.125 mg by mouth 2 (two) times daily.    [provider]  furosemide (LASIX) 40 MG tablet Take 1 tablet (40 mg total) by mouth daily. 07/13/16   Arrien, York Ram, MD  ipratropium-albuterol (DUONEB) 0.5-2.5 (3) MG/3ML SOLN Take 3 mLs by nebulization every 6 (six) hours as needed. 07/13/16   Arrien, York Ram, MD  lisinopril (PRINIVIL,ZESTRIL) 2.5 MG tablet Take 1 tablet (2.5 mg total) by mouth daily. 07/13/16   Arrien, York Ram, MD  lisinopril (  PRINIVIL,ZESTRIL) 2.5 MG tablet Take 2.5 mg by mouth daily.    [provider]  potassium chloride (K-DUR) 10 MEQ tablet Take 1 tablet (10 mEq total) by mouth daily. 07/13/16   Arrien, York RamMauricio Daniel, MD  potassium chloride (K-DUR,KLOR-CON) 10 MEQ tablet Take 10 mEq by mouth daily.    [provider]  warfarin (COUMADIN) 5 MG tablet Take 5 mg by mouth daily at 6 PM.  12/02/15   [provider]  warfarin (COUMADIN) 5 MG tablet Take 5 mg by mouth daily.    [provider]   Physical  Exam: Vitals:   10/03/16 0737 10/03/16 0800 10/03/16 0830 10/03/16 0928  BP: (!) 143/108 (!) 144/113 (!) 139/106 (!) 141/119  Pulse: 88 89 86 82  Resp: (!) 25 17 17 20   Temp:      TempSrc:      SpO2: 95% 98% 100% 96%  Weight:      Height:        Wt Readings from Last 3 Encounters:  10/03/16 63.5 kg (140 lb)  08/09/16 62.5 kg (137 lb 12.6 oz)  07/13/16 60.7 kg (133 lb 13.1 oz)    General:  Appears calm and comfortable; A&Ox3 Eyes:  PERRL, EOMI, normal lids, iris ENT:  grossly normal hearing, lips & tongue Neck:  no LAD, masses or thyromegaly Cardiovascular:  RRR, no m/r/g. No LE edema.  Respiratory:  CTA bilaterally, no w/r/r. Normal respiratory effort. Abdomen:  soft, ntnd Skin:  no rash or induration seen on limited exam Musculoskeletal:  grossly normal tone BUE/BLE Psychiatric:  grossly normal mood and affect, speech fluent and appropriate Neurologic:  CN 2-12 grossly intact, moves all extremities in coordinated fashion.          Labs on Admission:  Basic Metabolic Panel:  Recent Labs Lab 10/03/16 0700  NA 137  K 4.6  CL 109  CO2 16*  GLUCOSE 100*  BUN 17  CREATININE 1.12  CALCIUM 8.8*   Liver Function Tests: No results for input(s): AST, ALT, ALKPHOS, BILITOT, PROT, ALBUMIN in the last 168 hours. No results for input(s): LIPASE, AMYLASE in the last 168 hours. No results for input(s): AMMONIA in the last 168 hours. CBC:  Recent Labs Lab 10/03/16 0700  WBC 7.5  HGB 13.1  HCT 40.6  MCV 83.2  PLT 218   Cardiac Enzymes: No results for input(s): CKTOTAL, CKMB, CKMBINDEX, TROPONINI in the last 168 hours.  BNP (last 3 results)  Recent Labs  07/09/16 1828 08/06/16 0500 10/03/16 0738  BNP 1,627.3* 1,087.9* 2,670.0*    ProBNP (last 3 results) No results for input(s): PROBNP in the last 8760 hours.   Serum creatinine: 1.12 mg/dL 40/98/1105/26/18 91470700 Estimated creatinine clearance: 60.6 mL/min  CBG: No results for input(s): GLUCAP in the last 168  hours.  Radiological Exams on Admission: Dg Chest Portable 1 View  Result Date: 10/03/2016 CLINICAL DATA:  Shortness of breath, productive cough. EXAM: PORTABLE CHEST 1 VIEW COMPARISON:  Radiographs of July 10, 2016. FINDINGS: Stable cardiomediastinal silhouette. No pneumothorax is noted. No significant pleural effusion is noted. Stable bilateral interstitial densities are noted concerning for edema. Bony thorax is unremarkable. IMPRESSION: Stable mild bilateral pulmonary edema. Electronically Signed   By: Lupita RaiderJames  Green Jr, M.D.   On: 10/03/2016 07:25    EKG: Independently reviewed. NSR, RBBB. Compared to prior EKG 07/2016 no STEMI  Assessment/Plan Principal Problem:   Acute respiratory failure with hypoxia [facility](HCC) Active Problems:   Substance abuse   Heartburn  Hypertension  Acute Hypoxic Resp FailureSecondary to pulmonary edema Continue Lasix When necessary albuterol Got 1 dose of steroids in the emergency room  Hypertension When necessary hydralazine 10 mg IV as needed for severe blood pressure  Heartburn When necessary GI cocktail  Cocaine abuse Advised drug use cessation  Code Status: FULL  DVT Prophylaxis: warfarin Family Communication: pt asked [place] not to contact them Disposition Plan: Pending Improvement  Status: obs  Haydee Salter, MD Family Medicine [facility] [email or web address] Password [email or web address]

## 2016-10-03 NOTE — ED Provider Notes (Signed)
[facility]-EMERGENCY DEPT Provider Note   CSN: [id number] Arrival date & time: 10/03/16  4098     History   Chief Complaint Chief Complaint  Patient presents with  . Shortness of Breath    HPI [name] is a 66 y.o. male.  The history is provided by the patient and medical records.  Shortness of Breath  Associated symptoms include cough and wheezing.   66 y.o. M with hx of AFIB, CAD, CHF, COPD, HTN, stroke, hx of substance abuse, presenting to the ED with SOB.  States this began yesterday afternoon but worsening throughout the night.  States initially he was SOB but able to talk normally, but today states he can barely talk.  He reports he has had difficulty even walking from his bed to the bathroom which is about 15-20 feet away.  States he gets there but is very winded.  Has had productive cough with white sputum.  Denies fever but has had some chills.  No nausea, vomiting.  States chest feels tight but no other "pain".  States similar episode back in March 2018 and told at that time he had heart failure and had to be admitted.  Past Medical History:  Diagnosis Date  . Atrial fibrillation (HCC)   . CAD (coronary artery disease)    nonobstructive  . CHF (congestive heart failure) (HCC)   . Chronic systolic heart failure (HCC)   . COPD (chronic obstructive pulmonary disease) (HCC)   . Drug abuse    cocaine  . Hypertension   . Stroke (HCC)   . Substance abuse     Patient Active Problem List   Diagnosis Date Noted  . Acute pulmonary edema (HCC)   . Respiratory failure (HCC)   . Acute respiratory failure with hypercapnia (HCC) 08/06/2016  . Atrial fibrillation (HCC) 07/13/2016  . Chronic bronchitis (HCC) 07/13/2016  . Respiratory failure (HCC) 07/09/2016  . Pneumothorax on right   . 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 COPD exacerbation (HCC)   . History of CVA (cerebrovascular accident)   . Acute on chronic systolic heart failure (HCC) 07/08/2011  .  Hypokalemia 07/07/2011  . Flash pulmonary edema (HCC) 07/06/2011  . 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 Abnormal EKG 07/06/2011  . Chronic systolic congestive heart failure (HCC) 05/05/2011  . Acute respiratory failure with hypoxia (HCC) 05/03/2011  . SUBSTANCE ABUSE, MULTIPLE 03/25/2010  . Essential hypertension 03/25/2010    Past Surgical History:  Procedure Laterality Date  . JOINT REPLACEMENT  L knee       Home Medications    Prior to Admission medications   Medication Sig Start Date End Date Taking? Authorizing Provider  albuterol (PROVENTIL HFA;VENTOLIN HFA) 108 (90 Base) MCG/ACT inhaler Inhale 1 puff into the lungs every 6 (six) hours as needed for wheezing or shortness of breath.    [provider]  Albuterol Sulfate 108 (90 Base) MCG/ACT AEPB Inhale 1 puff into the lungs every 6 (six) hours as needed. Patient taking differently: Inhale 1 puff into the lungs every 6 (six) hours as needed (wheezing/shortness of breath).  02/01/16   Burns, Tinnie Gens, MD  aspirin 81 MG tablet Take 1 tablet (81 mg total) by mouth daily. Patient taking differently: Take 81 mg by mouth every other day.  02/01/16   Burns, Tinnie Gens, MD  atorvastatin (LIPITOR) 40 MG tablet Take 1 tablet (40 mg total) by mouth daily at 6 PM. 07/13/16   Arrien, York Ram, MD  carvedilol (COREG) 3.125 MG  tablet Take 3.125 mg by mouth 2 (two) times daily with a meal.    [provider]  carvedilol (COREG) 6.25 MG tablet Take 3.125 mg by mouth 2 (two) times daily.    [provider]  furosemide (LASIX) 40 MG tablet Take 1 tablet (40 mg total) by mouth daily. 07/13/16   Arrien, York RamMauricio Daniel, MD  ipratropium-albuterol (DUONEB) 0.5-2.5 (3) MG/3ML SOLN Take 3 mLs by nebulization every 6 (six) hours as needed. 07/13/16   Arrien, York RamMauricio Daniel, MD  lisinopril (PRINIVIL,ZESTRIL) 2.5 MG tablet Take 1 tablet (2.5 mg total) by mouth daily. 07/13/16   Arrien, York RamMauricio Daniel, MD  lisinopril  (PRINIVIL,ZESTRIL) 2.5 MG tablet Take 2.5 mg by mouth daily.    [provider]  potassium chloride (K-DUR) 10 MEQ tablet Take 1 tablet (10 mEq total) by mouth daily. 07/13/16   Arrien, York RamMauricio Daniel, MD  potassium chloride (K-DUR,KLOR-CON) 10 MEQ tablet Take 10 mEq by mouth daily.    [provider]  warfarin (COUMADIN) 5 MG tablet Take 5 mg by mouth daily at 6 PM.  12/02/15   [provider]  warfarin (COUMADIN) 5 MG tablet Take 5 mg by mouth daily.    [provider]    Family History Family History  Problem Relation Age of Onset  . Malignant hyperthermia Mother     Social History Social History  Substance Use Topics  . Smoking status: Former Smoker    Types: Cigarettes    Quit date: 09/03/2016  . Smokeless tobacco: Never Used  . Alcohol use Yes     Allergies   Patient has no known allergies.   Review of Systems Review of Systems  Respiratory: Positive for cough, chest tightness, shortness of breath and wheezing.   All other systems reviewed and are negative.    Physical Exam Updated Vital Signs BP (!) 125/100 (BP Location: Right Arm)   Pulse 94   Temp 97.7 F (36.5 C) (Oral)   Resp (!) 26   Ht 6\' 1"  (1.854 m)   Wt 63.5 kg (140 lb)   SpO2 98%   BMI 18.47 kg/m   Physical Exam  Constitutional: He is oriented to person, place, and time. He appears well-developed and well-nourished.  Appears fatigued  HENT:  Head: Normocephalic and atraumatic.  Mouth/Throat: Oropharynx is clear and moist.  Eyes: Conjunctivae and EOM are normal. Pupils are equal, round, and reactive to light.  Neck: Normal range of motion.  Cardiovascular: Normal rate, regular rhythm and normal heart sounds.   Pulmonary/Chest: Effort normal and breath sounds normal.  Shallow breathing with accessory muscle use, able to speak in short phrases, end expiratory wheezing noted  Abdominal: Soft. Bowel sounds are normal.  Musculoskeletal: Normal range of motion.    Neurological: He is alert and oriented to person, place, and time.  Skin: Skin is warm and dry.  Psychiatric: He has a normal mood and affect.  Nursing note and vitals reviewed.    ED Treatments / Results  Labs (all labs ordered are listed, but only abnormal results are displayed) Labs Reviewed  BASIC METABOLIC PANEL - Abnormal; Notable for the following:       Result Value   CO2 16 (*)    Glucose, Bld 100 (*)    Calcium 8.8 (*)    All other components within normal limits  PROTIME-INR - Abnormal; Notable for the following:    Prothrombin Time 24.1 (*)    All other components within normal limits  BRAIN NATRIURETIC  PEPTIDE - Abnormal; Notable for the following:    B Natriuretic Peptide 2,670.0 (*)    All other components within normal limits  I-STAT ARTERIAL BLOOD GAS, ED - Abnormal; Notable for the following:    Bicarbonate 19.7 (*)    Acid-base deficit 5.0 (*)    All other components within normal limits  CBC  I-STAT TROPOININ, ED    EKG  EKG Interpretation  Date/Time:  Saturday Oct 03 2016 06:54:29 EDT Ventricular Rate:  99 PR Interval:    QRS Duration: 171 QT Interval:  422 QTC Calculation: 542 R Axis:   137 Text Interpretation:  Sinus rhythm Probable left atrial enlargement RBBB and LPFB Confirmed by Nicanor Alcon, April ([id number]) on 10/03/2016 7:08:17 AM       Radiology Dg Chest Portable 1 View  Result Date: 10/03/2016 CLINICAL DATA:  Shortness of breath, productive cough. EXAM: PORTABLE CHEST 1 VIEW COMPARISON:  Radiographs of July 10, 2016. FINDINGS: Stable cardiomediastinal silhouette. No pneumothorax is noted. No significant pleural effusion is noted. Stable bilateral interstitial densities are noted concerning for edema. Bony thorax is unremarkable. IMPRESSION: Stable mild bilateral pulmonary edema. Electronically Signed   By: Lupita Raider, M.D.   On: 10/03/2016 07:25    Procedures Procedures (including critical care time)  Medications Ordered in  ED Medications  methylPREDNISolone sodium succinate (SOLU-MEDROL) 125 mg/2 mL injection 125 mg (not administered)  albuterol (PROVENTIL) (2.5 MG/3ML) 0.083% nebulizer solution 5 mg (not administered)  ipratropium (ATROVENT) nebulizer solution 0.5 mg (not administered)     Initial Impression / Assessment and Plan / ED Course  I have reviewed the triage vital signs and the nursing notes.  Pertinent labs & imaging results that were available during my care of the patient were reviewed by me and considered in my medical decision making (see chart for details).  66 year old male here with shortness of breath. Began yesterday and has been worsening. EMS reports O2 sats in the mid 80s, placed on 2 L. She does generally not require supplement oxygen. On exam he appears in mild distress, breathing is rapid, shallow, with accessory muscle use noted. He is speaking in short phrases. Workup is remarkable for BNP of 2670 with pulmonary edema noted on CXR.  ABG reassuring without signs of hypercapnia.  Patient remains stable on the 2L supplemental O2.  Additional lasix given.  Patient will be admitted for continued diuresis and further care.  Final Clinical Impressions(s) / ED Diagnoses   Final diagnoses:  Shortness of breath  Acute congestive heart failure, unspecified heart failure type [facility])    New Prescriptions New Prescriptions   No medications on file     Garlon Hatchet, PA-C 10/03/16 1052    Margarita Grizzle, MD 10/05/16 [phone]

## 2016-10-03 NOTE — ED Triage Notes (Signed)
Pt came in by EMS for SOB that started yesterday lasting all day that woke him up out of his sleep at 6 this morning. Pt also reports tightness in left chest. EMS gave 324 ASA and 1 nitro sublingual. EMS VS 97% RA with accessory muscle usage, clear lung sounds. BP 143/113, 107 HR.  HX CHF and COPD. Pt has a productive cough with white frothy sputum. Pt states last cocaine usage was night of 5/23.

## 2016-10-03 NOTE — ED Notes (Signed)
Reported patient was placed on O2 @ 2 liters  Earlier for sats dropping in the the high 80"s (89 %)

## 2016-10-04 DIAGNOSIS — J9601 Acute respiratory failure with hypoxia: Secondary | ICD-10-CM | POA: Diagnosis present

## 2016-10-04 DIAGNOSIS — R12 Heartburn: Secondary | ICD-10-CM

## 2016-10-04 DIAGNOSIS — I110 Hypertensive heart disease with heart failure: Secondary | ICD-10-CM | POA: Diagnosis present

## 2016-10-04 DIAGNOSIS — Z7982 Long term (current) use of aspirin: Secondary | ICD-10-CM | POA: Diagnosis not present

## 2016-10-04 DIAGNOSIS — J449 Chronic obstructive pulmonary disease, unspecified: Secondary | ICD-10-CM | POA: Diagnosis present

## 2016-10-04 DIAGNOSIS — I159 Secondary hypertension, unspecified: Secondary | ICD-10-CM

## 2016-10-04 DIAGNOSIS — I5023 Acute on chronic systolic (congestive) heart failure: Secondary | ICD-10-CM | POA: Diagnosis present

## 2016-10-04 DIAGNOSIS — Z9119 Patient's noncompliance with other medical treatment and regimen: Secondary | ICD-10-CM | POA: Diagnosis not present

## 2016-10-04 DIAGNOSIS — I2510 Atherosclerotic heart disease of native coronary artery without angina pectoris: Secondary | ICD-10-CM | POA: Diagnosis present

## 2016-10-04 DIAGNOSIS — F1910 Other psychoactive substance abuse, uncomplicated: Secondary | ICD-10-CM

## 2016-10-04 DIAGNOSIS — Z8673 Personal history of transient ischemic attack (TIA), and cerebral infarction without residual deficits: Secondary | ICD-10-CM | POA: Diagnosis not present

## 2016-10-04 DIAGNOSIS — Z87891 Personal history of nicotine dependence: Secondary | ICD-10-CM | POA: Diagnosis not present

## 2016-10-04 DIAGNOSIS — R6883 Chills (without fever): Secondary | ICD-10-CM | POA: Diagnosis present

## 2016-10-04 DIAGNOSIS — Z7901 Long term (current) use of anticoagulants: Secondary | ICD-10-CM | POA: Diagnosis not present

## 2016-10-04 DIAGNOSIS — R0602 Shortness of breath: Secondary | ICD-10-CM

## 2016-10-04 DIAGNOSIS — F1410 Cocaine abuse, uncomplicated: Secondary | ICD-10-CM | POA: Diagnosis present

## 2016-10-04 DIAGNOSIS — I509 Heart failure, unspecified: Secondary | ICD-10-CM | POA: Diagnosis not present

## 2016-10-04 DIAGNOSIS — Z9114 Patient's other noncompliance with medication regimen: Secondary | ICD-10-CM | POA: Diagnosis not present

## 2016-10-04 DIAGNOSIS — Z9111 Patient's noncompliance with dietary regimen: Secondary | ICD-10-CM | POA: Diagnosis not present

## 2016-10-04 DIAGNOSIS — Z79899 Other long term (current) drug therapy: Secondary | ICD-10-CM | POA: Diagnosis not present

## 2016-10-04 DIAGNOSIS — I482 Chronic atrial fibrillation: Secondary | ICD-10-CM | POA: Diagnosis present

## 2016-10-04 DIAGNOSIS — Z96652 Presence of left artificial knee joint: Secondary | ICD-10-CM | POA: Diagnosis present

## 2016-10-04 LAB — PROTIME-INR
INR: 2.87
Prothrombin Time: 30.7 seconds — ABNORMAL HIGH (ref 11.4–15.2)

## 2016-10-04 MED ORDER — IPRATROPIUM-ALBUTEROL 0.5-2.5 (3) MG/3ML IN SOLN
3.0000 mL | RESPIRATORY_TRACT | Status: DC | PRN
  Administered 2016-10-04: 3 mL via RESPIRATORY_TRACT
  Filled 2016-10-04: qty 3

## 2016-10-04 MED ORDER — WARFARIN SODIUM 3 MG PO TABS: 3.0000 mg | ORAL_TABLET | Freq: Every day | ORAL | Status: DC

## 2016-10-04 NOTE — Progress Notes (Addendum)
ANTICOAGULATION CONSULT NOTE - Follow-Up Consult  Pharmacy Consult for Warfarin Indication: atrial fibrillation  No Known Allergies  Patient Measurements: Height: 6\' 1"  (185.4 cm) Weight: 128 lb 6.4 oz (58.2 kg) (a scake) IBW/kg (Calculated) : 79.9 Heparin Dosing Weight: 75 kg  Vital Signs: Temp: 98 F (36.7 C) (05/27 0613) Temp Source: Oral (05/27 0613) BP: 109/72 (05/27 [id number]) Pulse Rate: 63 (05/27 0613)  Labs:  Recent Labs  10/03/16 0700 10/03/16 0738 10/03/16 1334 10/04/16 0415  HGB 13.1  --   --   --   HCT 40.6  --   --   --   PLT 218  --   --   --   LABPROT  --  24.1*  --  30.7*  INR  --  2.13  --  2.87  CREATININE 1.12  --   --   --   TROPONINI  --   --  0.04*  --     Estimated Creatinine Clearance: 55.6 mL/min (by C-G formula based on SCr of 1.12 mg/dL).   Medical History: Past Medical History:  Diagnosis Date  . Atrial fibrillation (HCC)   . CAD (coronary artery disease)    nonobstructive  . CHF (congestive heart failure) (HCC)   . Chronic systolic heart failure (HCC)   . COPD (chronic obstructive pulmonary disease) (HCC)   . Drug abuse    cocaine  . Heartburn   . Hypertension   . Stroke (HCC)   . Substance abuse     Assessment: [age] yoM on warfarin PTA for hx Afib/CVA who presented on 5/26 with SOB. Pharmacy consulted to resume warfarin dosing this admission. Admit INR 2.13 on PTA dosing regimen (last dose 5/25). INR now up to 2.87 after resuming PTA regimen yesterday - will give slightly reduced dose given rapid increase in INR. CBC stable and wnl.   PTA Warfarin Dose = 5mg  daily  Goal of Therapy:  INR 2-3   Plan:  -Warfarin 3mg  PO x1 tonight -Monitor daily INR, S/Sx bleeding  ADDENDUM: Plan to transition warfarin to Xarelto per MD. INR currently 2.87 but will potentially increase overnight. Therefore will not initiate Xarelto at this time, and will reassess INR tomorrow morning and ensure < 3.0.  Plan: -Hold warfarin tonight -INR  tomorrow morning  Fredonia HighlandMichael Jaremy Nosal, PharmD PGY-1 Pharmacy Resident Pager: [phone] 10/04/2016

## 2016-10-04 NOTE — Progress Notes (Signed)
PROGRESS NOTE  [name]  ZOX:[id number] DOB: May 08, 1951 DOA: 10/03/2016 PCP: [facility]:  Subjective: Still short of breath, on 2 L of oxygen this morning. Cough with frothy sputum.  Brief Narrative:  [name] is a 66 y.o. male  with past medical history significant for A fib, coronary disease, heart failure, COPD, cocaine use current, hypertension and stroke presents to the hospital with chief complaint shortness of breath. Patient recently did cocaine in the last 48 hours. Within the 48 hours patient developed shortness of breath. He has been coughing up "white stuff". Patient denies sick contacts. He is noncompliant with his medications and did not see any doctor recently.  Assessment & Plan:   Principal Problem:   Acute respiratory failure with hypoxia (HCC) Active Problems:   Substance abuse   Heartburn   Hypertension   Acute Hypoxic Resp Failure Secondary to pulmonary edema -Acute pulmonary edema secondary to acute on chronic systolic CHF -Continue with oxygen supplementation, continue diuresis. This is improving.  Acute on chronic systolic CHF -LVEF of 25% from 2-D echo on 07/13/2016, I will not repeat it.  -Acute exacerbation, Likely secondary to dietary indiscretion, cocaine abuse and noncompliance with medications. -Continue diuresis with IV Lasix, likely to hold beta blockers on discharge because of concurrent cocaine use.  Atrial fibrillation -Chronic atrial fibrillation, CHA2DS2-VASc is greater than 3. -Rate is controlled, is on Coumadin. -2-D echo reviewed, no mitral stenosis, likely it will be safer (history of noncompliance) to switch to several toe.  Hypertension When necessary hydralazine 10 mg IV as needed for severe blood pressure  Heartburn When necessary GI cocktail  Cocaine abuse Advised drug use cessation    DVT prophylaxis:  Code Status: Full Code Family Communication:    Disposition Plan:  Diet: Diet Heart Room service appropriate? Yes; Fluid consistency: Thin  Consultants:   None  Procedures:   None  Antimicrobials:   None   Objective: Vitals:   10/03/16 2031 10/03/16 2140 10/04/16 0300 10/04/16 0613  BP: 122/81  116/71 109/72  Pulse: 78  71 63  Resp: 16   15  Temp: 97.9 F (36.6 C)  98.2 F (36.8 C) 98 F (36.7 C)  TempSrc: Oral  Oral Oral  SpO2: 99% 98% 95% 99%  Weight:    58.2 kg (128 lb 6.4 oz)  Height:        Intake/Output Summary (Last 24 hours) at 10/04/16 1132 Last data filed at 10/04/16 0927  Gross per 24 hour  Intake              960 ml  Output             2300 ml  Net            -1340 ml   Filed Weights   10/03/16 0705 10/03/16 1115 10/04/16 0613  Weight: 63.5 kg (140 lb) 57.9 kg (127 lb 11.2 oz) 58.2 kg (128 lb 6.4 oz)    Examination: General exam: Appears calm and comfortable  Respiratory system: Clear to auscultation. Respiratory effort normal. Cardiovascular system: S1 & S2 heard, RRR. No JVD, murmurs, rubs, gallops or clicks. No pedal edema. Gastrointestinal system: Abdomen is nondistended, soft and nontender. No organomegaly or masses felt. Normal bowel sounds heard. Central nervous system: Alert and oriented. No focal neurological deficits. Extremities: Symmetric 5 x 5 power. Skin: No rashes, lesions or ulcers Psychiatry: Judgement and insight appear normal. Mood & affect appropriate.   Data Reviewed:  I have personally reviewed following labs and imaging studies  CBC:  Recent Labs Lab 10/03/16 0700  WBC 7.5  HGB 13.1  HCT 40.6  MCV 83.2  PLT 218   Basic Metabolic Panel:  Recent Labs Lab 10/03/16 0700  NA 137  K 4.6  CL 109  CO2 16*  GLUCOSE 100*  BUN 17  CREATININE 1.12  CALCIUM 8.8*   GFR: Estimated Creatinine Clearance: 55.6 mL/min (by C-G formula based on SCr of 1.12 mg/dL). Liver Function Tests: No results for input(s): AST, ALT, ALKPHOS, BILITOT, PROT, ALBUMIN in the last 168  hours. No results for input(s): LIPASE, AMYLASE in the last 168 hours. No results for input(s): AMMONIA in the last 168 hours. Coagulation Profile:  Recent Labs Lab 10/03/16 0738 10/04/16 0415  INR 2.13 2.87   Cardiac Enzymes:  Recent Labs Lab 10/03/16 1334  TROPONINI 0.04*   BNP (last 3 results) No results for input(s): PROBNP in the last 8760 hours. HbA1C: No results for input(s): HGBA1C in the last 72 hours. CBG: No results for input(s): GLUCAP in the last 168 hours. Lipid Profile: No results for input(s): CHOL, HDL, LDLCALC, TRIG, CHOLHDL, LDLDIRECT in the last 72 hours. Thyroid Function Tests: No results for input(s): TSH, T4TOTAL, FREET4, T3FREE, THYROIDAB in the last 72 hours. Anemia Panel: No results for input(s): VITAMINB12, FOLATE, FERRITIN, TIBC, IRON, RETICCTPCT in the last 72 hours. Urine analysis:    Component Value Date/Time   COLORURINE YELLOW 07/06/2011 0221   APPEARANCEUR CLEAR 07/06/2011 0221   LABSPEC 1.012 07/06/2011 0221   PHURINE 6.0 07/06/2011 0221   GLUCOSEU NEGATIVE 07/06/2011 0221   HGBUR NEGATIVE 07/06/2011 0221   BILIRUBINUR NEGATIVE 07/06/2011 0221   KETONESUR NEGATIVE 07/06/2011 0221   PROTEINUR 30 (A) 07/06/2011 0221   UROBILINOGEN 1.0 07/06/2011 0221   NITRITE NEGATIVE 07/06/2011 0221   LEUKOCYTESUR NEGATIVE 07/06/2011 0221   Sepsis Labs: @LABRCNTIP (procalcitonin:4,lacticidven:4)  )No results found for this or any previous visit (from the past 240 hour(s)).   Invalid input(s): PROCALCITONIN, LACTICACIDVEN   Radiology Studies: Dg Chest Portable 1 View  Result Date: 10/03/2016 CLINICAL DATA:  Shortness of breath, productive cough. EXAM: PORTABLE CHEST 1 VIEW COMPARISON:  Radiographs of July 10, 2016. FINDINGS: Stable cardiomediastinal silhouette. No pneumothorax is noted. No significant pleural effusion is noted. Stable bilateral interstitial densities are noted concerning for edema. Bony thorax is unremarkable. IMPRESSION:  Stable mild bilateral pulmonary edema. Electronically Signed   By: Lupita RaiderJames  Green Jr, M.D.   On: 10/03/2016 07:25        Scheduled Meds: . warfarin  3 mg Oral q1800  . Warfarin - Pharmacist Dosing Inpatient   Does not apply q1800   Continuous Infusions:   LOS: 0 days    Time spent: 35 minutes    Kabir Brannock A, MD [facility] Pager [phone]  If 7PM-7AM, please contact night-coverage [email or web address] Password [email or web address] 10/04/2016, 11:32 AM

## 2016-10-05 LAB — BASIC METABOLIC PANEL
Anion gap: 7 (ref 5–15)
BUN: 15 mg/dL (ref 6–20)
CALCIUM: 8.7 mg/dL — AB (ref 8.9–10.3)
CO2: 25 mmol/L (ref 22–32)
CREATININE: 1.1 mg/dL (ref 0.61–1.24)
Chloride: 105 mmol/L (ref 101–111)
GFR calc non Af Amer: >60 mL/min (ref >60)
Glucose, Bld: 88 mg/dL (ref 65–99)
Potassium: 3.5 mmol/L (ref 3.5–5.1)
SODIUM: 137 mmol/L (ref 135–145)

## 2016-10-05 LAB — PROTIME-INR
INR: 2.13
PROTHROMBIN TIME: 24.1 s — AB (ref 11.4–15.2)

## 2016-10-05 MED ORDER — RIVAROXABAN 20 MG PO TABS: 20.0000 mg | ORAL_TABLET | Freq: Every day | ORAL | 2 refills | Status: DC

## 2016-10-05 MED ORDER — POTASSIUM CHLORIDE CRYS ER 20 MEQ PO TBCR: 40.0000 meq | EXTENDED_RELEASE_TABLET | Freq: Once | ORAL | Status: DC

## 2016-10-05 MED ORDER — FUROSEMIDE 40 MG PO TABS: 40.0000 mg | ORAL_TABLET | Freq: Every day | ORAL | 2 refills | Status: DC

## 2016-10-05 MED ORDER — RIVAROXABAN 20 MG PO TABS: 20.0000 mg | ORAL_TABLET | Freq: Every day | ORAL | Status: DC

## 2016-10-05 MED ORDER — ALBUTEROL SULFATE 108 (90 BASE) MCG/ACT IN AEPB
1.0000 | INHALATION_SPRAY | Freq: Four times a day (QID) | RESPIRATORY_TRACT | 0 refills | Status: DC | PRN
Start: 2016-10-05 — End: 2016-11-19

## 2016-10-05 MED ORDER — LISINOPRIL 2.5 MG PO TABS: 2.5000 mg | ORAL_TABLET | Freq: Every day | ORAL | 2 refills | Status: DC

## 2016-10-05 NOTE — Progress Notes (Signed)
Pt has orders to be discharged. Discharge instructions given and pt has no additional questions at this time. Medication regimen reviewed and pt educated. Pt verbalized understanding and has no additional questions. Telemetry box removed. IV removed and site in good condition. Pt stable and waiting for transportation. 

## 2016-10-05 NOTE — Discharge Summary (Signed)
Physician Discharge Summary  [name] YNW:Martin [id number] DOB: 11/04/1950 DOA: 10/03/2016  PCP: [facility]  Admit date: 10/03/2016 Discharge date: 10/05/2016  Admitted From: Home Disposition: Home  Recommendations for Outpatient Follow-up:  1. Follow up with PCP in 1-2 weeks 2. Please obtain BMP/CBC in one week. 3. Follow-up with cardiology, I spoke with cardiology master, cardiology will call for an appointment  Home Health: NA Equipment/Devices:NA  Discharge Condition: Stable CODE STATUS: Full Code Diet recommendation: Diet Heart Room service appropriate? Yes; Fluid consistency: Thin Diet - low sodium heart healthy  Brief/Interim Summary: Martin JoMelvin L Seymoreis a 65 y.o.malewith past medical history significant for A fib, coronary disease, heart failure, COPD, cocaine use current, hypertension and stroke presents to the hospital with chief complaint shortness of breath. Patient recently did cocaine in the last 48 hours. Within the 48 hours patient developed shortness of breath. He has been coughing up "white stuff". Patient denies sick contacts. He is noncompliant with his medications and did not see any doctor recently.  Discharge Diagnoses:  Principal Problem:   Acute respiratory failure with hypoxia (HCC) Active Problems:   Substance abuse   Heartburn   Hypertension   Acute Hypoxic Resp Failure Secondary to pulmonary edema -Acute pulmonary edema secondary to acute on chronic systolic CHF -He required 2 L to keep his oxygen saturation above 90%. -This is resolved, on day of discharge his sats is in the high 90s on room air.  Acute on chronic systolic CHF -LVEF of 25% from 2-D echo on 07/13/2016, I will not repeat it.  -Unclear if it's ischemic or secondary to cocaine abuse, follow-up with cardiology as outpatient. -Acute exacerbation, Likely secondary to dietary indiscretion, cocaine abuse and noncompliance with medications. -Diuresed with  IV Lasix, patient reported being out of his medication, his lisinopril and Lasix pre-prescribed. -Successful removal of 3 L of fluids, no lower extremity edema, discharge to follow-up with PCP and cardiology.  Atrial fibrillation -Chronic atrial fibrillation, CHA2DS2-VASc is greater than 3. -Rate is controlled, is on Coumadin. For some reason he is on aspirin (no recent cath) this is discontinued. -2-D echo reviewed, no mitral stenosis, likely it will be safer (history of noncompliance) to switch to Xarelto. -Switched to Xarelto, benefit check done, has Medicaid, this is is going to cost him less than $4 for prescription.  Hypertension -When necessary hydralazine 10 mg IV as needed for severe blood pressure  Heartburn -When necessary GI cocktail  Cocaine abuse -I had prolonged discussion to counsel cessation of cocaine, and how it is affecting his heart.   Discharge Instructions  Discharge Instructions    Diet - low sodium heart healthy    Complete by:  As directed    Increase activity slowly    Complete by:  As directed      Allergies as of 10/05/2016   No Known Allergies     Medication List    STOP taking these medications   aspirin 81 MG tablet   carvedilol 3.125 MG tablet Commonly known as:  COREG   warfarin 5 MG tablet Commonly known as:  COUMADIN     TAKE these medications   Albuterol Sulfate 108 (90 Base) MCG/ACT Aepb Inhale 1 puff into the lungs every 6 (six) hours as needed (wheezing/shortness of breath).   atorvastatin 40 MG tablet Commonly known as:  LIPITOR Take 1 tablet (40 mg total) by mouth daily at 6 PM.   furosemide 40 MG tablet Commonly known as:  LASIX Take  1 tablet (40 mg total) by mouth daily.   ipratropium-albuterol 0.5-2.5 (3) MG/3ML Soln Commonly known as:  DUONEB Take 3 mLs by nebulization every 6 (six) hours as needed.   lisinopril 2.5 MG tablet Commonly known as:  PRINIVIL,ZESTRIL Take 1 tablet (2.5 mg total) by mouth  daily.   potassium chloride 10 MEQ tablet Commonly known as:  K-DUR Take 1 tablet (10 mEq total) by mouth daily.   rivaroxaban 20 MG Tabs tablet Commonly known as:  XARELTO Take 1 tablet (20 mg total) by mouth daily with supper.      Follow-up Information    [facility] Follow up in 1 week(s).   Specialty:  Family Medicine Contact information: [address] [place] [address] [phone]          No Known Allergies  Consultations:  None  Procedures (Echo, Carotid, EGD, Colonoscopy, ERCP)   Radiological studies: Dg Chest Portable 1 View  Result Date: 10/03/2016 CLINICAL DATA:  Shortness of breath, productive cough. EXAM: PORTABLE CHEST 1 VIEW COMPARISON:  Radiographs of July 10, 2016. FINDINGS: Stable cardiomediastinal silhouette. No pneumothorax is noted. No significant pleural effusion is noted. Stable bilateral interstitial densities are noted concerning for edema. Bony thorax is unremarkable. IMPRESSION: Stable mild bilateral pulmonary edema. Electronically Signed   By: Lupita Raider, M.D.   On: 10/03/2016 07:25     Subjective:  Discharge Exam: Vitals:   10/04/16 1239 10/04/16 1542 10/04/16 2019 10/05/16 0643  BP: 116/78 115/84 112/66 117/88  Pulse: 62 64 64 68  Resp: 18 18 17 18   Temp: 98 F (36.7 C)  97.6 F (36.4 C) 97.3 F (36.3 C)  TempSrc: Oral  Oral Oral  SpO2: 96% 98% 97% 97%  Weight:    57.6 kg (127 lb)  Height:       General: Pt is alert, awake, not in acute distress Cardiovascular: RRR, S1/S2 +, no rubs, no gallops Respiratory: CTA bilaterally, no wheezing, no rhonchi Abdominal: Soft, NT, ND, bowel sounds + Extremities: no edema, no cyanosis   The results of significant diagnostics from this hospitalization (including imaging, microbiology, ancillary and laboratory) are listed below for reference.    Microbiology: No results found for this or any previous visit (from the past 240 hour(s)).    Labs: BNP (last 3 results)  Recent Labs  07/09/16 1828 08/06/16 0500 10/03/16 0738  BNP 1,627.3* 1,087.9* 2,670.0*   Basic Metabolic Panel:  Recent Labs Lab 10/03/16 0700 10/05/16 0258  NA 137 137  K 4.6 3.5  CL 109 105  CO2 16* 25  GLUCOSE 100* 88  BUN 17 15  CREATININE 1.12 1.10  CALCIUM 8.8* 8.7*   Liver Function Tests: No results for input(s): AST, ALT, ALKPHOS, BILITOT, PROT, ALBUMIN in the last 168 hours. No results for input(s): LIPASE, AMYLASE in the last 168 hours. No results for input(s): AMMONIA in the last 168 hours. CBC:  Recent Labs Lab 10/03/16 0700  WBC 7.5  HGB 13.1  HCT 40.6  MCV 83.2  PLT 218   Cardiac Enzymes:  Recent Labs Lab 10/03/16 1334  TROPONINI 0.04*   BNP: Invalid input(s): POCBNP CBG: No results for input(s): GLUCAP in the last 168 hours. D-Dimer No results for input(s): DDIMER in the last 72 hours. Hgb A1c No results for input(s): HGBA1C in the last 72 hours. Lipid Profile No results for input(s): CHOL, HDL, LDLCALC, TRIG, CHOLHDL, LDLDIRECT in the last 72 hours. Thyroid function studies  No results for input(s): TSH, T4TOTAL, T3FREE, THYROIDAB in the last 72 hours.  Invalid input(s): FREET3 Anemia work up No results for input(s): VITAMINB12, FOLATE, FERRITIN, TIBC, IRON, RETICCTPCT in the last 72 hours. Urinalysis    Component Value Date/Time   COLORURINE YELLOW 07/06/2011 0221   APPEARANCEUR CLEAR 07/06/2011 0221   LABSPEC 1.012 07/06/2011 0221   PHURINE 6.0 07/06/2011 0221   GLUCOSEU NEGATIVE 07/06/2011 0221   HGBUR NEGATIVE 07/06/2011 0221   BILIRUBINUR NEGATIVE 07/06/2011 0221   KETONESUR NEGATIVE 07/06/2011 0221   PROTEINUR 30 (A) 07/06/2011 0221   UROBILINOGEN 1.0 07/06/2011 0221   NITRITE NEGATIVE 07/06/2011 0221   LEUKOCYTESUR NEGATIVE 07/06/2011 0221   Sepsis Labs Invalid input(s): PROCALCITONIN,  WBC,  LACTICIDVEN Microbiology No results found for this or any previous visit (from the past 240  hour(s)).   Time coordinating discharge: Over 30 minutes  SIGNED:   Clint Lipps, MD  [facility] 10/05/2016, 11:05 AM Pager   If 7PM-7AM, please contact night-coverage [email or web address] Password [email or web address]

## 2016-10-05 NOTE — Progress Notes (Signed)
ANTICOAGULATION CONSULT NOTE - Follow-Up Consult  Pharmacy Consult for Warfarin>>Xarelto Indication: atrial fibrillation  No Known Allergies  Patient Measurements: Height: 6\' 1"  (185.4 cm) Weight: 127 lb (57.6 kg) (a scale) IBW/kg (Calculated) : 79.9 Heparin Dosing Weight: 75 kg  Vital Signs: Temp: 97.3 F (36.3 C) (05/28 0643) Temp Source: Oral (05/28 0643) BP: 117/88 (05/28 0643) Pulse Rate: 68 (05/28 0643)  Labs:  Recent Labs  10/03/16 0700 10/03/16 0738 10/03/16 1334 10/04/16 0415 10/05/16 0258  HGB 13.1  --   --   --   --   HCT 40.6  --   --   --   --   PLT 218  --   --   --   --   LABPROT  --  24.1*  --  30.7* 24.1*  INR  --  2.13  --  2.87 2.13  CREATININE 1.12  --   --   --  1.10  TROPONINI  --   --  0.04*  --   --     Estimated Creatinine Clearance: 56 mL/min (by C-G formula based on SCr of 1.1 mg/dL).   Medical History: Past Medical History:  Diagnosis Date  . Atrial fibrillation (HCC)   . CAD (coronary artery disease)    nonobstructive  . CHF (congestive heart failure) (HCC)   . Chronic systolic heart failure (HCC)   . COPD (chronic obstructive pulmonary disease) (HCC)   . Drug abuse    cocaine  . Heartburn   . Hypertension   . Stroke (HCC)   . Substance abuse     Assessment: [age] yoM on warfarin PTA for hx Afib/CVA who presented on 5/26 with SOB. Pharmacy consulted to resume warfarin dosing this admission, now to transition to Xarelto. Okay to transition once INR < 3, INR this morning 2.13 and CrCl ~855ml/min.   Plan:  -Initiate Xarelto 20mg  PO daily -Pharmacy will sign off, please re-consult as needed  Fredonia HighlandMichael Bitonti, PharmD PGY-1 Pharmacy Resident Pager: [phone] 10/05/2016

## 2016-10-08 ENCOUNTER — Emergency Department (HOSPITAL_COMMUNITY)
Admission: EM | Admit: 2016-10-08 | Discharge: 2016-10-09 | Disposition: A | Discharge status: Home / Self Care | Payer: Medicare Other | Attending: Emergency Medicine | Admitting: Emergency Medicine

## 2016-10-08 ENCOUNTER — Encounter (HOSPITAL_COMMUNITY): Payer: Self-pay

## 2016-10-08 ENCOUNTER — Emergency Department (HOSPITAL_COMMUNITY): Payer: Medicare Other

## 2016-10-08 DIAGNOSIS — I4891 Unspecified atrial fibrillation: Secondary | ICD-10-CM | POA: Insufficient documentation

## 2016-10-08 DIAGNOSIS — I509 Heart failure, unspecified: Secondary | ICD-10-CM

## 2016-10-08 DIAGNOSIS — I110 Hypertensive heart disease with heart failure: Secondary | ICD-10-CM | POA: Diagnosis not present

## 2016-10-08 DIAGNOSIS — J449 Chronic obstructive pulmonary disease, unspecified: Secondary | ICD-10-CM | POA: Diagnosis not present

## 2016-10-08 DIAGNOSIS — I5023 Acute on chronic systolic (congestive) heart failure: Secondary | ICD-10-CM | POA: Insufficient documentation

## 2016-10-08 DIAGNOSIS — Z79899 Other long term (current) drug therapy: Secondary | ICD-10-CM | POA: Diagnosis not present

## 2016-10-08 DIAGNOSIS — Z87891 Personal history of nicotine dependence: Secondary | ICD-10-CM | POA: Diagnosis not present

## 2016-10-08 DIAGNOSIS — Z96652 Presence of left artificial knee joint: Secondary | ICD-10-CM | POA: Insufficient documentation

## 2016-10-08 DIAGNOSIS — I2510 Atherosclerotic heart disease of native coronary artery without angina pectoris: Secondary | ICD-10-CM | POA: Insufficient documentation

## 2016-10-08 DIAGNOSIS — Z8673 Personal history of transient ischemic attack (TIA), and cerebral infarction without residual deficits: Secondary | ICD-10-CM | POA: Insufficient documentation

## 2016-10-08 DIAGNOSIS — R0602 Shortness of breath: Secondary | ICD-10-CM | POA: Diagnosis present

## 2016-10-08 LAB — BASIC METABOLIC PANEL
ANION GAP: 6 (ref 5–15)
BUN: 16 mg/dL (ref 6–20)
CALCIUM: 8.2 mg/dL — AB (ref 8.9–10.3)
CHLORIDE: 115 mmol/L — AB (ref 101–111)
CO2: 20 mmol/L — ABNORMAL LOW (ref 22–32)
Creatinine, Ser: 0.99 mg/dL (ref 0.61–1.24)
GFR calc non Af Amer: >60 mL/min (ref >60)
Glucose, Bld: 97 mg/dL (ref 65–99)
Potassium: 4.1 mmol/L (ref 3.5–5.1)
Sodium: 141 mmol/L (ref 135–145)

## 2016-10-08 LAB — CBC WITH DIFFERENTIAL/PLATELET
BASOS ABS: 0 10*3/uL (ref 0.0–0.1)
BASOS PCT: 0 %
Eosinophils Absolute: 0.1 10*3/uL (ref 0.0–0.7)
Eosinophils Relative: 1 %
HCT: 38.7 % — ABNORMAL LOW (ref 39.0–52.0)
HEMOGLOBIN: 12.5 g/dL — AB (ref 13.0–17.0)
Lymphocytes Relative: 30 %
Lymphs Abs: 1.8 10*3/uL (ref 0.7–4.0)
MCH: 27.4 pg (ref 26.0–34.0)
MCHC: 32.3 g/dL (ref 30.0–36.0)
MCV: 84.7 fL (ref 78.0–100.0)
Monocytes Absolute: 0.5 10*3/uL (ref 0.1–1.0)
Monocytes Relative: 9 %
NEUTROS ABS: 3.5 10*3/uL (ref 1.7–7.7)
NEUTROS PCT: 60 %
Platelets: 219 10*3/uL (ref 150–400)
RBC: 4.57 MIL/uL (ref 4.22–5.81)
RDW: 15.2 % (ref 11.5–15.5)
WBC: 5.9 10*3/uL (ref 4.0–10.5)

## 2016-10-08 LAB — I-STAT TROPONIN, ED: TROPONIN I, POC: 0.02 ng/mL (ref 0.00–0.08)

## 2016-10-08 LAB — BRAIN NATRIURETIC PEPTIDE: B NATRIURETIC PEPTIDE 5: 1325 pg/mL — AB (ref 0.0–100.0)

## 2016-10-08 MED ORDER — RIVAROXABAN 20 MG PO TABS
20.0000 mg | ORAL_TABLET | Freq: Every day | ORAL | Status: DC
  Administered 2016-10-08: 20 mg via ORAL
  Filled 2016-10-08: qty 1

## 2016-10-08 MED ORDER — FUROSEMIDE 10 MG/ML IJ SOLN
40.0000 mg | INTRAMUSCULAR | Status: AC
  Administered 2016-10-08: 40 mg via INTRAVENOUS
  Filled 2016-10-08: qty 4

## 2016-10-08 NOTE — ED Triage Notes (Signed)
Pt brought in by [facility]. Pt c/o of SOB starting at noon today. Pt states that he has not taken his lasix in two days because he has not been able to get prescription filled. Pt also c/o productive cough with frothy white sputum. Pt O2 sat 99% on RA in NAD. Denies chest pain, diaphoresis, nausea, fevers and weakness.

## 2016-10-08 NOTE — ED Notes (Signed)
Ambulated patient down 20 foot hallway, patient maintained a steady 98% O2, reported no shortness of breath, no dizziness, felt normal while walking.

## 2016-10-08 NOTE — ED Provider Notes (Signed)
[facility]-EMERGENCY DEPT Provider Note   CSN: [id number] Arrival date & time: 10/08/16  1841     History   Chief Complaint Chief Complaint  Patient presents with  . Shortness of Breath    HPI [name] is a 66 y.o. male.  Patient with PMH of afib, CAD, CHF, COPD, HTN, Stroke, and polysubstance abuse, presents to the ED with a chief complaint of SOB.  He states that he was recently admitted to the hospital for CHF exacerbation.  He states that when he was discharged on Monday, he was unable to get his medications due to cost.  He states that it is the end of the month, and he has no money, but will be able to get the prescriptions tomorrow.  He reports associated exertional dyspnea, and productive cough.  He reports that he has a small twinge of chest pain.  He denies recent cocaine use, but has been around it recently.  He has been intubated several times in the past, and states that he came in early in an attempt to avoid this.  He was switched off of coumadin and put on Xarelto for his afib during his last hospitalization.     The history is provided by the patient. No language interpreter was used.    Past Medical History:  Diagnosis Date  . Atrial fibrillation (HCC)   . CAD (coronary artery disease)    nonobstructive  . CHF (congestive heart failure) (HCC)   . Chronic systolic heart failure (HCC)   . COPD (chronic obstructive pulmonary disease) (HCC)   . Drug abuse    cocaine  . Heartburn   . Hypertension   . Stroke (HCC)   . Substance abuse     Patient Active Problem List   Diagnosis Date Noted  . Substance abuse   . Heartburn   . Hypertension   . Acute pulmonary edema (HCC)   . Respiratory failure (HCC)   . Acute respiratory failure with hypercapnia (HCC) 08/06/2016  . Atrial fibrillation (HCC) 07/13/2016  . Chronic bronchitis (HCC) 07/13/2016  . Respiratory failure (HCC) 07/09/2016  . Pneumothorax on right   . Secondary spontaneous pneumothorax 01/29/2016    . COPD exacerbation (HCC)   . History of CVA (cerebrovascular accident)   . Acute on chronic systolic heart failure (HCC) 07/08/2011  . Hypokalemia 07/07/2011  . Flash pulmonary edema (HCC) 07/06/2011  . 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 Abnormal EKG 07/06/2011  . Chronic systolic congestive heart failure (HCC) 05/05/2011  . Acute respiratory failure with hypoxia (HCC) 05/03/2011  . SUBSTANCE ABUSE, MULTIPLE 03/25/2010  . Essential hypertension 03/25/2010    Past Surgical History:  Procedure Laterality Date  . JOINT REPLACEMENT  L knee       Home Medications    Prior to Admission medications   Medication Sig Start Date End Date Taking? Authorizing Provider  Albuterol Sulfate 108 (90 Base) MCG/ACT AEPB Inhale 1 puff into the lungs every 6 (six) hours as needed (wheezing/shortness of breath). 10/05/16   Clydia Llano, MD  atorvastatin (LIPITOR) 40 MG tablet Take 1 tablet (40 mg total) by mouth daily at 6 PM. 07/13/16   Arrien, York Ram, MD  furosemide (LASIX) 40 MG tablet Take 1 tablet (40 mg total) by mouth daily. 10/05/16   Clydia Llano, MD  ipratropium-albuterol (DUONEB) 0.5-2.5 (3) MG/3ML SOLN Take 3 mLs by nebulization every 6 (six) hours as needed. 07/13/16   Arrien, York Ram, MD  lisinopril (PRINIVIL,ZESTRIL) 2.5 MG tablet  Take 1 tablet (2.5 mg total) by mouth daily. 10/05/16   Clydia Llano, MD  potassium chloride (K-DUR) 10 MEQ tablet Take 1 tablet (10 mEq total) by mouth daily. 07/13/16   Arrien, York Ram, MD  rivaroxaban (XARELTO) 20 MG TABS tablet Take 1 tablet (20 mg total) by mouth daily with supper. 10/05/16   Clydia Llano, MD    Family History Family History  Problem Relation Age of Onset  . Malignant hyperthermia Mother     Social History Social History  Substance Use Topics  . Smoking status: Former Smoker    Types: Cigarettes    Quit date: 09/03/2016  . Smokeless tobacco: Never Used  . Alcohol use Yes     Allergies    Patient has no known allergies.   Review of Systems Review of Systems  Respiratory: Positive for cough and shortness of breath.   All other systems reviewed and are negative.    Physical Exam Updated Vital Signs BP (!) 127/97 (BP Location: Right Arm)   Pulse 79   Temp 97.7 F (36.5 C) (Oral)   Resp 17   Ht 6\' 1"  (1.854 m)   Wt 61.2 kg (135 lb)   SpO2 100%   BMI 17.81 kg/m   Physical Exam  Constitutional: He is oriented to person, place, and time. He appears well-developed and well-nourished.  HENT:  Head: Normocephalic and atraumatic.  Eyes: Conjunctivae and EOM are normal. Pupils are equal, round, and reactive to light. Right eye exhibits no discharge. Left eye exhibits no discharge. No scleral icterus.  Neck: Normal range of motion. Neck supple. No JVD present.  Cardiovascular: Normal rate, regular rhythm and normal heart sounds.  Exam reveals no gallop and no friction rub.   No murmur heard. Pulmonary/Chest: Effort normal and breath sounds normal. No respiratory distress. He has no wheezes. He has no rales. He exhibits no tenderness.  Mild crackle in left lung base  Abdominal: Soft. He exhibits no distension and no mass. There is no tenderness. There is no rebound and no guarding.  Musculoskeletal: Normal range of motion. He exhibits no tenderness.  No pitting edema  Neurological: He is alert and oriented to person, place, and time.  Skin: Skin is warm and dry.  Psychiatric: He has a normal mood and affect. His behavior is normal. Judgment and thought content normal.  Nursing note and vitals reviewed.    ED Treatments / Results  Labs (all labs ordered are listed, but only abnormal results are displayed) Labs Reviewed  CBC WITH DIFFERENTIAL/PLATELET - Abnormal; Notable for the following:       Result Value   Hemoglobin 12.5 (*)    HCT 38.7 (*)    All other components within normal limits  BASIC METABOLIC PANEL - Abnormal; Notable for the following:    Chloride  115 (*)    CO2 20 (*)    Calcium 8.2 (*)    All other components within normal limits  BRAIN NATRIURETIC PEPTIDE - Abnormal; Notable for the following:    B Natriuretic Peptide 1,325.0 (*)    All other components within normal limits  I-STAT TROPOININ, ED    EKG  EKG Interpretation None       Radiology Dg Chest 2 View  Result Date: 10/08/2016 CLINICAL DATA:  66 year old with acute onset shortness of breath that began at noon today, associated with productive cough of frothy material. Patient has not been able to take his prescribed Lasix for the past 2 days. EXAM: CHEST  2 VIEW COMPARISON:  10/03/2016, 07/10/2016 and earlier. FINDINGS: Cardiac silhouette mildly to moderately enlarged. Hilar and mediastinal contours otherwise unremarkable. Interval worsening of diffuse interstitial pulmonary edema. Lungs otherwise clear. Small bilateral pleural effusions, increased in size since the examination 5 days ago. Visualized bony thorax intact. IMPRESSION: Moderate CHF which has worsened since the examination 5 days ago. Small bilateral pleural effusions which have increased in size. Electronically Signed   By: Hulan Saashomas  Lawrence M.D.   On: 10/08/2016 19:12    Procedures Procedures (including critical care time)  Medications Ordered in ED Medications - No data to display   Initial Impression / Assessment and Plan / ED Course  I have reviewed the triage vital signs and the nursing notes.  Pertinent labs & imaging results that were available during my care of the patient were reviewed by me and considered in my medical decision making (see chart for details).     Patient with extensive PMH as listed above.  Recent admission for CHF exacerbation.  Has not been able to take his lasix for the past several days because of finances.  He will be able to get his meds tomorrow.  CXR shows worsening CHF from last study.  No pitting edema.  VSS.  No hypoxia.  Breathing and speaking easily.  Anticipate  discharge after labs and lasix in the ED.  Feeling improved.  Urinating after lasix.  Ambulates maintaining 98% O2 saturation.  Requests discharge.  He can get his medications filled tomorrow.  Will give dose of his Xarelto tonight.    PCP follow-up.  Return precautions given.  Final Clinical Impressions(s) / ED Diagnoses   Final diagnoses:  Acute on chronic congestive heart failure, unspecified heart failure type [facility](HCC)    New Prescriptions New Prescriptions   No medications on file     Felipa FurnaceBrowning, Isidore Margraf, PA-C 10/08/16 2309    Raeford RazorKohut, Stephen, MD 10/11/16 2008

## 2016-10-08 NOTE — Discharge Instructions (Signed)
Get your medications filled tomorrow and begin taking them as directed.  Return to the ER if your symptoms worsen.

## 2016-10-08 NOTE — ED Notes (Signed)
Patient transported to X-ray 

## 2016-10-08 NOTE — ED Notes (Signed)
Pt stable, understands discharge instructions, and reasons for return.   

## 2016-10-23 ENCOUNTER — Ambulatory Visit: Payer: Medicare Other | Admitting: Physician Assistant

## 2016-10-30 ENCOUNTER — Ambulatory Visit: Payer: Medicare Other | Admitting: Cardiovascular Disease

## 2016-11-10 ENCOUNTER — Inpatient Hospital Stay (HOSPITAL_COMMUNITY)
Admission: EM | Admit: 2016-11-10 | Discharge: 2016-11-19 | DRG: 291 | Disposition: A | Discharge status: Home Health | Payer: Medicare Other | Attending: Internal Medicine | Admitting: Internal Medicine

## 2016-11-10 ENCOUNTER — Encounter (HOSPITAL_COMMUNITY): Payer: Self-pay | Admitting: Emergency Medicine

## 2016-11-10 ENCOUNTER — Emergency Department (HOSPITAL_COMMUNITY): Payer: Medicare Other

## 2016-11-10 DIAGNOSIS — J810 Acute pulmonary edema: Secondary | ICD-10-CM | POA: Diagnosis not present

## 2016-11-10 DIAGNOSIS — R1084 Generalized abdominal pain: Secondary | ICD-10-CM | POA: Diagnosis not present

## 2016-11-10 DIAGNOSIS — I4510 Unspecified right bundle-branch block: Secondary | ICD-10-CM | POA: Diagnosis present

## 2016-11-10 DIAGNOSIS — I110 Hypertensive heart disease with heart failure: Principal | ICD-10-CM | POA: Diagnosis present

## 2016-11-10 DIAGNOSIS — D696 Thrombocytopenia, unspecified: Secondary | ICD-10-CM | POA: Diagnosis present

## 2016-11-10 DIAGNOSIS — R402122 Coma scale, eyes open, to pain, at arrival to emergency department: Secondary | ICD-10-CM | POA: Diagnosis not present

## 2016-11-10 DIAGNOSIS — Z0189 Encounter for other specified special examinations: Secondary | ICD-10-CM

## 2016-11-10 DIAGNOSIS — J9600 Acute respiratory failure, unspecified whether with hypoxia or hypercapnia: Secondary | ICD-10-CM | POA: Diagnosis not present

## 2016-11-10 DIAGNOSIS — R64 Cachexia: Secondary | ICD-10-CM | POA: Diagnosis present

## 2016-11-10 DIAGNOSIS — Z9911 Dependence on respirator [ventilator] status: Secondary | ICD-10-CM

## 2016-11-10 DIAGNOSIS — B961 Klebsiella pneumoniae [K. pneumoniae] as the cause of diseases classified elsewhere: Secondary | ICD-10-CM | POA: Diagnosis not present

## 2016-11-10 DIAGNOSIS — N390 Urinary tract infection, site not specified: Secondary | ICD-10-CM | POA: Diagnosis not present

## 2016-11-10 DIAGNOSIS — G9340 Encephalopathy, unspecified: Secondary | ICD-10-CM | POA: Diagnosis not present

## 2016-11-10 DIAGNOSIS — I159 Secondary hypertension, unspecified: Secondary | ICD-10-CM | POA: Diagnosis not present

## 2016-11-10 DIAGNOSIS — Z87891 Personal history of nicotine dependence: Secondary | ICD-10-CM | POA: Diagnosis not present

## 2016-11-10 DIAGNOSIS — I6789 Other cerebrovascular disease: Secondary | ICD-10-CM | POA: Diagnosis not present

## 2016-11-10 DIAGNOSIS — B9620 Unspecified Escherichia coli [E. coli] as the cause of diseases classified elsewhere: Secondary | ICD-10-CM | POA: Diagnosis not present

## 2016-11-10 DIAGNOSIS — E43 Unspecified severe protein-calorie malnutrition: Secondary | ICD-10-CM | POA: Diagnosis not present

## 2016-11-10 DIAGNOSIS — R402362 Coma scale, best motor response, obeys commands, at arrival to emergency department: Secondary | ICD-10-CM | POA: Diagnosis present

## 2016-11-10 DIAGNOSIS — J9601 Acute respiratory failure with hypoxia: Secondary | ICD-10-CM | POA: Diagnosis present

## 2016-11-10 DIAGNOSIS — I2510 Atherosclerotic heart disease of native coronary artery without angina pectoris: Secondary | ICD-10-CM | POA: Diagnosis present

## 2016-11-10 DIAGNOSIS — I10 Essential (primary) hypertension: Secondary | ICD-10-CM | POA: Diagnosis not present

## 2016-11-10 DIAGNOSIS — I480 Paroxysmal atrial fibrillation: Secondary | ICD-10-CM | POA: Diagnosis not present

## 2016-11-10 DIAGNOSIS — R402242 Coma scale, best verbal response, confused conversation, at arrival to emergency department: Secondary | ICD-10-CM | POA: Diagnosis present

## 2016-11-10 DIAGNOSIS — Z8673 Personal history of transient ischemic attack (TIA), and cerebral infarction without residual deficits: Secondary | ICD-10-CM

## 2016-11-10 DIAGNOSIS — G9341 Metabolic encephalopathy: Secondary | ICD-10-CM | POA: Diagnosis not present

## 2016-11-10 DIAGNOSIS — F1910 Other psychoactive substance abuse, uncomplicated: Secondary | ICD-10-CM | POA: Diagnosis present

## 2016-11-10 DIAGNOSIS — R739 Hyperglycemia, unspecified: Secondary | ICD-10-CM | POA: Diagnosis not present

## 2016-11-10 DIAGNOSIS — J449 Chronic obstructive pulmonary disease, unspecified: Secondary | ICD-10-CM | POA: Diagnosis present

## 2016-11-10 DIAGNOSIS — Z7901 Long term (current) use of anticoagulants: Secondary | ICD-10-CM

## 2016-11-10 DIAGNOSIS — I509 Heart failure, unspecified: Secondary | ICD-10-CM

## 2016-11-10 DIAGNOSIS — I5022 Chronic systolic (congestive) heart failure: Secondary | ICD-10-CM | POA: Diagnosis present

## 2016-11-10 DIAGNOSIS — I255 Ischemic cardiomyopathy: Secondary | ICD-10-CM | POA: Diagnosis not present

## 2016-11-10 DIAGNOSIS — N179 Acute kidney failure, unspecified: Secondary | ICD-10-CM

## 2016-11-10 DIAGNOSIS — R1030 Lower abdominal pain, unspecified: Secondary | ICD-10-CM | POA: Diagnosis not present

## 2016-11-10 DIAGNOSIS — I4891 Unspecified atrial fibrillation: Secondary | ICD-10-CM | POA: Diagnosis present

## 2016-11-10 DIAGNOSIS — Z23 Encounter for immunization: Secondary | ICD-10-CM

## 2016-11-10 DIAGNOSIS — N3000 Acute cystitis without hematuria: Secondary | ICD-10-CM | POA: Diagnosis not present

## 2016-11-10 DIAGNOSIS — J811 Chronic pulmonary edema: Secondary | ICD-10-CM

## 2016-11-10 DIAGNOSIS — K219 Gastro-esophageal reflux disease without esophagitis: Secondary | ICD-10-CM | POA: Diagnosis present

## 2016-11-10 DIAGNOSIS — I5023 Acute on chronic systolic (congestive) heart failure: Secondary | ICD-10-CM | POA: Diagnosis present

## 2016-11-10 DIAGNOSIS — E871 Hypo-osmolality and hyponatremia: Secondary | ICD-10-CM | POA: Diagnosis not present

## 2016-11-10 DIAGNOSIS — R109 Unspecified abdominal pain: Secondary | ICD-10-CM

## 2016-11-10 DIAGNOSIS — Z681 Body mass index (BMI) 19 or less, adult: Secondary | ICD-10-CM | POA: Diagnosis not present

## 2016-11-10 DIAGNOSIS — Z781 Physical restraint status: Secondary | ICD-10-CM

## 2016-11-10 DIAGNOSIS — E875 Hyperkalemia: Secondary | ICD-10-CM

## 2016-11-10 DIAGNOSIS — E876 Hypokalemia: Secondary | ICD-10-CM | POA: Diagnosis present

## 2016-11-10 DIAGNOSIS — Z9289 Personal history of other medical treatment: Secondary | ICD-10-CM

## 2016-11-10 DIAGNOSIS — F14188 Cocaine abuse with other cocaine-induced disorder: Secondary | ICD-10-CM | POA: Diagnosis present

## 2016-11-10 DIAGNOSIS — I952 Hypotension due to drugs: Secondary | ICD-10-CM | POA: Diagnosis present

## 2016-11-10 DIAGNOSIS — G4731 Primary central sleep apnea: Secondary | ICD-10-CM | POA: Diagnosis present

## 2016-11-10 DIAGNOSIS — I5021 Acute systolic (congestive) heart failure: Secondary | ICD-10-CM | POA: Diagnosis not present

## 2016-11-10 LAB — CBC WITH DIFFERENTIAL/PLATELET
BASOS ABS: 0 10*3/uL (ref 0.0–0.1)
Basophils Relative: 0 %
Eosinophils Absolute: 0 10*3/uL (ref 0.0–0.7)
Eosinophils Relative: 0 %
HCT: 44.3 % (ref 39.0–52.0)
HEMOGLOBIN: 14.2 g/dL (ref 13.0–17.0)
LYMPHS PCT: 20 %
Lymphs Abs: 1.9 10*3/uL (ref 0.7–4.0)
MCH: 28.1 pg (ref 26.0–34.0)
MCHC: 32.1 g/dL (ref 30.0–36.0)
MCV: 87.7 fL (ref 78.0–100.0)
Monocytes Absolute: 0.8 10*3/uL (ref 0.1–1.0)
Monocytes Relative: 9 %
NEUTROS ABS: 6.7 10*3/uL (ref 1.7–7.7)
NEUTROS PCT: 71 %
PLATELETS: 147 10*3/uL — AB (ref 150–400)
RBC: 5.05 MIL/uL (ref 4.22–5.81)
RDW: 15.3 % (ref 11.5–15.5)
WBC: 9.5 10*3/uL (ref 4.0–10.5)

## 2016-11-10 LAB — I-STAT VENOUS BLOOD GAS, ED
Acid-base deficit: 2 mmol/L (ref 0.0–2.0)
Bicarbonate: 26.6 mmol/L (ref 20.0–28.0)
O2 Saturation: 98 %
PCO2 VEN: 59.2 mmHg (ref 44.0–60.0)
PH VEN: 7.26 (ref 7.250–7.430)
TCO2: 28 mmol/L (ref 0–100)
pO2, Ven: 123 mmHg — ABNORMAL HIGH (ref 32.0–45.0)

## 2016-11-10 LAB — BRAIN NATRIURETIC PEPTIDE: B Natriuretic Peptide: 1932.3 pg/mL — ABNORMAL HIGH (ref 0.0–100.0)

## 2016-11-10 LAB — GLUCOSE, CAPILLARY
GLUCOSE-CAPILLARY: 109 mg/dL — AB (ref 65–99)
Glucose-Capillary: 128 mg/dL — ABNORMAL HIGH (ref 65–99)
Glucose-Capillary: 129 mg/dL — ABNORMAL HIGH (ref 65–99)

## 2016-11-10 LAB — HEPATIC FUNCTION PANEL
ALK PHOS: 109 U/L (ref 38–126)
ALT: 23 U/L (ref 17–63)
AST: 39 U/L (ref 15–41)
Albumin: 3.7 g/dL (ref 3.5–5.0)
BILIRUBIN INDIRECT: 0.6 mg/dL (ref 0.3–0.9)
Bilirubin, Direct: 0.3 mg/dL (ref 0.1–0.5)
TOTAL PROTEIN: 6.4 g/dL — AB (ref 6.5–8.1)
Total Bilirubin: 0.9 mg/dL (ref 0.3–1.2)

## 2016-11-10 LAB — ACETAMINOPHEN LEVEL

## 2016-11-10 LAB — COMPREHENSIVE METABOLIC PANEL
ALT: 22 U/L (ref 17–63)
ANION GAP: 12 (ref 5–15)
AST: 33 U/L (ref 15–41)
Albumin: 3.7 g/dL (ref 3.5–5.0)
Alkaline Phosphatase: 118 U/L (ref 38–126)
BUN: 12 mg/dL (ref 6–20)
CHLORIDE: 103 mmol/L (ref 101–111)
CO2: 21 mmol/L — ABNORMAL LOW (ref 22–32)
Calcium: 9.1 mg/dL (ref 8.9–10.3)
Creatinine, Ser: 1.39 mg/dL — ABNORMAL HIGH (ref 0.61–1.24)
GFR, EST NON AFRICAN AMERICAN: 52 mL/min — AB (ref >60)
Glucose, Bld: 178 mg/dL — ABNORMAL HIGH (ref 65–99)
POTASSIUM: 4.1 mmol/L (ref 3.5–5.1)
Sodium: 136 mmol/L (ref 135–145)
TOTAL PROTEIN: 6.5 g/dL (ref 6.5–8.1)
Total Bilirubin: 1 mg/dL (ref 0.3–1.2)

## 2016-11-10 LAB — I-STAT ARTERIAL BLOOD GAS, ED
Acid-Base Excess: 1 mmol/L (ref 0.0–2.0)
Bicarbonate: 26.5 mmol/L (ref 20.0–28.0)
O2 SAT: 93 %
PH ART: 7.374 (ref 7.350–7.450)
PO2 ART: 71 mmHg — AB (ref 83.0–108.0)
Patient temperature: 98.6
TCO2: 28 mmol/L (ref 0–100)
pCO2 arterial: 45.4 mmHg (ref 32.0–48.0)

## 2016-11-10 LAB — I-STAT CHEM 8, ED
BUN: 16 mg/dL (ref 6–20)
CALCIUM ION: 1.06 mmol/L — AB (ref 1.15–1.40)
CHLORIDE: 106 mmol/L (ref 101–111)
Creatinine, Ser: 1.3 mg/dL — ABNORMAL HIGH (ref 0.61–1.24)
Glucose, Bld: 137 mg/dL — ABNORMAL HIGH (ref 65–99)
HEMATOCRIT: 46 % (ref 39.0–52.0)
Hemoglobin: 15.6 g/dL (ref 13.0–17.0)
Potassium: 4.1 mmol/L (ref 3.5–5.1)
SODIUM: 138 mmol/L (ref 135–145)
TCO2: 25 mmol/L (ref 0–100)

## 2016-11-10 LAB — URINALYSIS, ROUTINE W REFLEX MICROSCOPIC
Bilirubin Urine: NEGATIVE
GLUCOSE, UA: NEGATIVE mg/dL
Hgb urine dipstick: NEGATIVE
Ketones, ur: NEGATIVE mg/dL
Leukocytes, UA: NEGATIVE
NITRITE: NEGATIVE
PH: 6 (ref 5.0–8.0)
Protein, ur: NEGATIVE mg/dL
Specific Gravity, Urine: 1.006 (ref 1.005–1.030)
Squamous Epithelial / LPF: NONE SEEN

## 2016-11-10 LAB — APTT
APTT: 30 s (ref 24–36)
aPTT: 69 seconds — ABNORMAL HIGH (ref 24–36)

## 2016-11-10 LAB — MRSA PCR SCREENING: MRSA by PCR: NEGATIVE

## 2016-11-10 LAB — HEPARIN LEVEL (UNFRACTIONATED): Heparin Unfractionated: 0.34 IU/mL (ref 0.30–0.70)

## 2016-11-10 LAB — SALICYLATE LEVEL

## 2016-11-10 LAB — LACTIC ACID, PLASMA
LACTIC ACID, VENOUS: 1.9 mmol/L (ref 0.5–1.9)
LACTIC ACID, VENOUS: 1.9 mmol/L (ref 0.5–1.9)

## 2016-11-10 LAB — PHOSPHORUS: Phosphorus: 3.2 mg/dL (ref 2.5–4.6)

## 2016-11-10 LAB — RAPID URINE DRUG SCREEN, HOSP PERFORMED
AMPHETAMINES: NOT DETECTED
BARBITURATES: NOT DETECTED
BENZODIAZEPINES: POSITIVE — AB
Cocaine: POSITIVE — AB
Opiates: NOT DETECTED
TETRAHYDROCANNABINOL: NOT DETECTED

## 2016-11-10 LAB — ETHANOL

## 2016-11-10 LAB — TROPONIN I
Troponin I: <0.03 ng/mL (ref <0.03)
Troponin I: 0.03 ng/mL (ref <0.03)

## 2016-11-10 LAB — PROTIME-INR
INR: 1.04
PROTHROMBIN TIME: 13.7 s (ref 11.4–15.2)

## 2016-11-10 LAB — MAGNESIUM: Magnesium: 2.4 mg/dL (ref 1.7–2.4)

## 2016-11-10 LAB — I-STAT TROPONIN, ED: Troponin i, poc: 0.03 ng/mL (ref 0.00–0.08)

## 2016-11-10 MED ORDER — MIDAZOLAM HCL 2 MG/2ML IJ SOLN
2.0000 mg | INTRAMUSCULAR | Status: AC | PRN
  Administered 2016-11-10 (×3): 2 mg via INTRAVENOUS
  Filled 2016-11-10: qty 2

## 2016-11-10 MED ORDER — PROPOFOL 1000 MG/100ML IV EMUL
5.0000 ug/kg/min | Freq: Once | INTRAVENOUS | Status: DC
  Administered 2016-11-10: 10 ug/kg/min via INTRAVENOUS

## 2016-11-10 MED ORDER — FENTANYL CITRATE (PF) 100 MCG/2ML IJ SOLN
INTRAMUSCULAR | Status: AC
Start: 2016-11-10 — End: 2016-11-10
  Filled 2016-11-10: qty 2

## 2016-11-10 MED ORDER — PHENYLEPHRINE 40 MCG/ML (10ML) SYRINGE FOR IV PUSH (FOR BLOOD PRESSURE SUPPORT)
200.0000 ug | PREFILLED_SYRINGE | Freq: Once | INTRAVENOUS | Status: AC
  Administered 2016-11-10: 200 ug via INTRAVENOUS

## 2016-11-10 MED ORDER — PHENYLEPHRINE 40 MCG/ML (10ML) SYRINGE FOR IV PUSH (FOR BLOOD PRESSURE SUPPORT)
PREFILLED_SYRINGE | INTRAVENOUS | Status: AC
  Filled 2016-11-10: qty 10

## 2016-11-10 MED ORDER — ALBUTEROL (5 MG/ML) CONTINUOUS INHALATION SOLN
10.0000 mg/h | INHALATION_SOLUTION | RESPIRATORY_TRACT | Status: DC
  Administered 2016-11-10: 10 mg/h via RESPIRATORY_TRACT

## 2016-11-10 MED ORDER — FENTANYL CITRATE (PF) 100 MCG/2ML IJ SOLN
100.0000 ug | INTRAMUSCULAR | Status: DC | PRN
  Administered 2016-11-10 (×2): 100 ug via INTRAVENOUS
  Filled 2016-11-10: qty 2

## 2016-11-10 MED ORDER — MIDAZOLAM HCL 2 MG/2ML IJ SOLN
2.0000 mg | INTRAMUSCULAR | Status: DC | PRN
  Administered 2016-11-10 – 2016-11-12 (×6): 2 mg via INTRAVENOUS
  Filled 2016-11-10 (×4): qty 2

## 2016-11-10 MED ORDER — FENTANYL BOLUS VIA INFUSION
50.0000 ug | INTRAVENOUS | Status: DC | PRN
  Filled 2016-11-10: qty 50

## 2016-11-10 MED ORDER — PROPOFOL 1000 MG/100ML IV EMUL: 5.0000 ug/kg/min | INTRAVENOUS | Status: DC

## 2016-11-10 MED ORDER — HEPARIN (PORCINE) IN NACL 100-0.45 UNIT/ML-% IJ SOLN
1000.0000 [IU]/h | INTRAMUSCULAR | Status: DC
  Administered 2016-11-10 – 2016-11-13 (×4): 1000 [IU]/h via INTRAVENOUS
  Filled 2016-11-10 (×7): qty 250

## 2016-11-10 MED ORDER — HEPARIN SODIUM (PORCINE) 5000 UNIT/ML IJ SOLN: 5000.0000 [IU] | Freq: Three times a day (TID) | INTRAMUSCULAR | Status: DC

## 2016-11-10 MED ORDER — VITAL HIGH PROTEIN PO LIQD
1000.0000 mL | ORAL | Status: DC
Start: 2016-11-10 — End: 2016-11-11
  Administered 2016-11-10 (×2)
  Administered 2016-11-10: 1000 mL
  Administered 2016-11-10 – 2016-11-11 (×9)

## 2016-11-10 MED ORDER — FUROSEMIDE 10 MG/ML IJ SOLN
40.0000 mg | Freq: Once | INTRAMUSCULAR | Status: AC
  Administered 2016-11-10: 40 mg via INTRAVENOUS
  Filled 2016-11-10: qty 4

## 2016-11-10 MED ORDER — FENTANYL 2500MCG IN NS 250ML (10MCG/ML) PREMIX INFUSION
25.0000 ug/h | INTRAVENOUS | Status: DC
  Administered 2016-11-10: 50 ug/h via INTRAVENOUS
  Administered 2016-11-11: 150 ug/h via INTRAVENOUS
  Administered 2016-11-12: 170 ug/h via INTRAVENOUS
  Filled 2016-11-10 (×3): qty 250

## 2016-11-10 MED ORDER — INSULIN ASPART 100 UNIT/ML ~~LOC~~ SOLN
2.0000 [IU] | SUBCUTANEOUS | Status: DC
  Administered 2016-11-10 – 2016-11-12 (×4): 2 [IU] via SUBCUTANEOUS
  Administered 2016-11-13: 6 [IU] via SUBCUTANEOUS
  Administered 2016-11-13: 2 [IU] via SUBCUTANEOUS

## 2016-11-10 MED ORDER — DOPAMINE-DEXTROSE 3.2-5 MG/ML-% IV SOLN
5.0000 ug/kg/min | INTRAVENOUS | Status: DC
  Administered 2016-11-10: 5 ug/kg/min via INTRAVENOUS
  Filled 2016-11-10: qty 250

## 2016-11-10 MED ORDER — ETOMIDATE 2 MG/ML IV SOLN
INTRAVENOUS | Status: DC | PRN
  Administered 2016-11-10: 20 mg via INTRAVENOUS

## 2016-11-10 MED ORDER — IPRATROPIUM BROMIDE 0.02 % IN SOLN
0.5000 mg | Freq: Once | RESPIRATORY_TRACT | Status: AC
  Administered 2016-11-10: 0.5 mg via RESPIRATORY_TRACT

## 2016-11-10 MED ORDER — ROCURONIUM BROMIDE 50 MG/5ML IV SOLN
INTRAVENOUS | Status: DC | PRN
  Administered 2016-11-10: 70 mg via INTRAVENOUS

## 2016-11-10 MED ORDER — PRO-STAT SUGAR FREE PO LIQD
30.0000 mL | Freq: Two times a day (BID) | ORAL | Status: DC
  Administered 2016-11-10 – 2016-11-11 (×2): 30 mL
  Filled 2016-11-10 (×2): qty 30

## 2016-11-10 MED ORDER — FENTANYL CITRATE (PF) 100 MCG/2ML IJ SOLN
50.0000 ug | Freq: Once | INTRAMUSCULAR | Status: AC
  Administered 2016-11-10: 50 ug via INTRAVENOUS

## 2016-11-10 MED ORDER — FENTANYL CITRATE (PF) 100 MCG/2ML IJ SOLN: 100.0000 ug | INTRAMUSCULAR | Status: DC | PRN

## 2016-11-10 MED ORDER — PANTOPRAZOLE SODIUM 40 MG IV SOLR
40.0000 mg | Freq: Every day | INTRAVENOUS | Status: DC
  Administered 2016-11-10 – 2016-11-13 (×4): 40 mg via INTRAVENOUS
  Filled 2016-11-10 (×4): qty 40

## 2016-11-10 MED ORDER — IPRATROPIUM-ALBUTEROL 0.5-2.5 (3) MG/3ML IN SOLN
3.0000 mL | Freq: Four times a day (QID) | RESPIRATORY_TRACT | Status: DC
  Administered 2016-11-10 – 2016-11-14 (×18): 3 mL via RESPIRATORY_TRACT
  Filled 2016-11-10 (×19): qty 3

## 2016-11-10 MED ORDER — DIPHENHYDRAMINE HCL 50 MG/ML IJ SOLN
12.5000 mg | Freq: Four times a day (QID) | INTRAMUSCULAR | Status: DC | PRN
  Administered 2016-11-10: 12.5 mg via INTRAVENOUS
  Filled 2016-11-10 (×3): qty 1

## 2016-11-10 MED ORDER — MIDAZOLAM HCL 2 MG/2ML IJ SOLN
INTRAMUSCULAR | Status: AC
  Filled 2016-11-10: qty 2

## 2016-11-10 MED ORDER — PROPOFOL 1000 MG/100ML IV EMUL
INTRAVENOUS | Status: AC
  Filled 2016-11-10: qty 100

## 2016-11-10 MED ORDER — SODIUM CHLORIDE 0.9 % IV SOLN: 250.0000 mL | INTRAVENOUS | Status: DC | PRN

## 2016-11-10 MED ORDER — FUROSEMIDE 10 MG/ML IJ SOLN
4.0000 mg/h | INTRAVENOUS | Status: DC
  Administered 2016-11-10 – 2016-11-13 (×2): 4 mg/h via INTRAVENOUS
  Filled 2016-11-10 (×3): qty 25

## 2016-11-10 NOTE — ED Notes (Signed)
Successful intubation, color change, 7.5, 23 at the lip. Bilateral breath sounds. sats 100%

## 2016-11-10 NOTE — ED Notes (Signed)
Pt now responding to 10mg  abuterol/0.5 atrovent treatment from resp. Pt able to follow some commands

## 2016-11-10 NOTE — Progress Notes (Signed)
eLink Physician-Brief Progress Note Patient Name: Martin AbedMelvin L Cassidy DOB: 06/02/1950 MRN: [id number]   Date of Service  11/10/2016  HPI/Events of Note  Patient c/o itching.   eICU Interventions  Will order: 1. Benadryl 12.5 mg IV Q 6 hours PRN itching.      Intervention Category Major Interventions: Other:  Sommer,Steven Dennard Nipugene 11/10/2016, 8:29 PM

## 2016-11-10 NOTE — ED Notes (Signed)
Portable xray at bedside.

## 2016-11-10 NOTE — ED Notes (Signed)
Pt placed on Bipap by respiratory 

## 2016-11-10 NOTE — Progress Notes (Addendum)
ANTICOAGULATION CONSULT NOTE - Initial Consult  Pharmacy Consult:  Heparin Indication: atrial fibrillation  No Known Allergies  Patient Measurements: Height: 6' 0.5" (184.2 cm) Weight: 145 lb (65.8 kg) IBW/kg (Calculated) : 78.75 Heparin Dosing Weight: 66 kg  Vital Signs: Temp: 95.7 F (35.4 C) (07/03 1227) Temp Source: Oral (07/03 1227) BP: 83/67 (07/03 1500) Pulse Rate: 79 (07/03 1500)  Labs:  Recent Labs  11/10/16 1254 11/10/16 1326  HGB 15.6 14.2  HCT 46.0 44.3  PLT  --  147*  CREATININE 1.30* 1.39*    Estimated Creatinine Clearance: 50.6 mL/min (A) (by C-G formula based on SCr of 1.39 mg/dL (H)).   Medical History: Past Medical History:  Diagnosis Date  . Atrial fibrillation (HCC)   . CAD (coronary artery disease)    nonobstructive  . CHF (congestive heart failure) (HCC)   . Chronic systolic heart failure (HCC)   . COPD (chronic obstructive pulmonary disease) (HCC)   . Drug abuse    cocaine  . Heartburn   . Hypertension   . Stroke (HCC)   . Substance abuse       Assessment: 89 YOM presented with respiratory distress requiring intubation in the ED.  Pharmacy consulted to initiate IV heparin for history of AFib.  Family unable to provide information regarding patient's home meds.  Based on pharmacy record, it appears that patient was switched from Coumadin to Xarelto in June 2018.   Goal of Therapy:  Heparin level 0.3-0.7 units/ml Monitor platelets by anticoagulation protocol: Yes    Plan:  - Check stat PT / INR, aPTT, and heparin level - Initiate IV heparin pending labs    Kaylani Fromme D. Laney Potashang, PharmD, BCPS Pager:  [phone] 11/10/2016, 3:18 PM     ==========================   Addendum: - Baseline labs:  INR 1.04, aPTT 30, heparin level "therapeutic" at 0.34.  Given these labs, it is more convincing that patient was on Xarelto PTA.  Unsure of last Xarelto dose, but given low aPTT, will initiate IV heparin now - use aPTT to guide heparin  dosing since Xarelto could falsely elevate heparin levels   Plan: - Heparin gtt at 1000 units/hr, no bolus - Check 6 hr aPTT - Daily aPTT, heparin level and CBC    Shontel Santee D. Laney Potashang, PharmD, BCPS Pager:  [phone] 11/10/2016, 4:17 PM

## 2016-11-10 NOTE — H&P (Signed)
PULMONARY / CRITICAL CARE MEDICINE   Name: [name] MRN: [id number] DOB: 09/13/1950    ADMISSION DATE:  11/10/2016 CONSULTATION DATE:  11/10/2016  REFERRING MD:  Dr. Rush Landmarkegeler   CHIEF COMPLAINT:  Dyspnea   HISTORY OF PRESENT ILLNESS:   66 year old male with PMH of COPD, systolic HF (EF 25%), and polysubstance abuse (cocaine)  Presents to ED on 7/3 with severe shortness breath by EMS. Upon arrival to ED patient requesting to be intubated as he felt he was suffering. Moments later he became minimally response and was immediately intubated. ABG 7.26/59/123. WBC 9.5, Troponin 0.03, BNP 1932.3. PCCM asked to admit.   Recent admission on 3/1 and 3/29 with acute hypoxic respiratory failure in setting of pulmonary edema requiring intubation. During both admissions it was documented that he testes positive for cocaine.    PAST MEDICAL HISTORY :  He  has a past medical history of Atrial fibrillation (HCC); CAD (coronary artery disease); CHF (congestive heart failure) (HCC); Chronic systolic heart failure (HCC); COPD (chronic obstructive pulmonary disease) (HCC); Drug abuse; Heartburn; Hypertension; Stroke [facility](HCC); and Substance abuse.  PAST SURGICAL HISTORY: He  has a past surgical history that includes Joint replacement (L knee).  No Known Allergies  No current facility-administered medications on file prior to encounter.    Current Outpatient Prescriptions on File Prior to Encounter  Medication Sig  . atorvastatin (LIPITOR) 40 MG tablet Take 1 tablet (40 mg total) by mouth daily at 6 PM.  . lisinopril (PRINIVIL,ZESTRIL) 2.5 MG tablet Take 1 tablet (2.5 mg total) by mouth daily.  . Albuterol Sulfate 108 (90 Base) MCG/ACT AEPB Inhale 1 puff into the lungs every 6 (six) hours as needed (wheezing/shortness of breath).  . furosemide (LASIX) 40 MG tablet Take 1 tablet (40 mg total) by mouth daily.  Marland Kitchen. ipratropium-albuterol (DUONEB) 0.5-2.5 (3) MG/3ML SOLN Take 3 mLs by nebulization every 6 (six)  hours as needed.  . potassium chloride (K-DUR) 10 MEQ tablet Take 1 tablet (10 mEq total) by mouth daily.  . rivaroxaban (XARELTO) 20 MG TABS tablet Take 1 tablet (20 mg total) by mouth daily with supper.    FAMILY HISTORY:  His indicated that the status of his mother is unknown.    SOCIAL HISTORY: He  reports that he quit smoking about 2 months ago. His smoking use included Cigarettes. He has never used smokeless tobacco. He reports that he drinks alcohol. He reports that he uses drugs, including Cocaine, about 1 time per week.  REVIEW OF SYSTEMS:   Unable to review as patient is intubated and sedated   SUBJECTIVE:  On vent and propofol gtt.   VITAL SIGNS: BP 122/89   Pulse (!) 103   Temp (!) 95.7 F (35.4 C) (Oral)   Resp (!) 21   Ht 6' 0.5" (1.842 m)   Wt 65.8 kg (145 lb)   SpO2 94%   BMI 19.40 kg/m   HEMODYNAMICS:    VENTILATOR SETTINGS: Vent Mode: PRVC FiO2 (%):  [40 %] 40 % Set Rate:  [10 bmp-20 bmp] 20 bmp Vt Set:  [630 mL] 630 mL PEEP:  [5 cmH20-8 cmH20] 5 cmH20 Plateau Pressure:  [18 cmH20] 18 cmH20  INTAKE / OUTPUT: No intake/output data recorded.  PHYSICAL EXAMINATION: General:  Adult male, no distress  Neuro:  Sedated, withdrawals from pain, does not follow commands  HEENT:  ETT in place  Cardiovascular:  RRR, no MRG  Lungs:  Crackles noted to bases, diminished breath sounds, non-labored  Abdomen:  Non-distended, active bowel sounds  Musculoskeletal:  -edema Skin:  Warm, dry, intact   LABS:  BMET  Recent Labs Lab 11/10/16 1254 11/10/16 1326  NA 138 136  K 4.1 4.1  CL 106 103  CO2  --  21*  BUN 16 12  CREATININE 1.30* 1.39*  GLUCOSE 137* 178*    Electrolytes  Recent Labs Lab 11/10/16 1326  CALCIUM 9.1    CBC  Recent Labs Lab 11/10/16 1254 11/10/16 1326  WBC  --  9.5  HGB 15.6 14.2  HCT 46.0 44.3  PLT  --  147*    Coag's No results for input(s): APTT, INR in the last 168 hours.  Sepsis Markers No results for  input(s): LATICACIDVEN, PROCALCITON, O2SATVEN in the last 168 hours.  ABG  Recent Labs Lab 11/10/16 1442  PHART 7.374  PCO2ART 45.4  PO2ART 71.0*    Liver Enzymes  Recent Labs Lab 11/10/16 1326  AST 33  ALT 22  ALKPHOS 118  BILITOT 1.0  ALBUMIN 3.7    Cardiac Enzymes No results for input(s): TROPONINI, PROBNP in the last 168 hours.  Glucose No results for input(s): GLUCAP in the last 168 hours.  Imaging Dg Chest Port 1 View  Result Date: 11/10/2016 CLINICAL DATA:  Endotracheal tube placement. EXAM: PORTABLE CHEST 1 VIEW COMPARISON:  10/08/2016 FINDINGS: Endotracheal tube terminates 6.7 cm above carina.Nasogastric tube extends beyond the inferior aspect of the film. Midline trachea. Mild cardiomegaly. Prominence of the pulmonary arteries. No pleural fluid. No pneumothorax. Hyperinflation. Moderate interstitial edema. Thickening of the right minor fissure. No lobar consolidation. IMPRESSION: Moderate congestive heart failure. Appropriate position of endotracheal tube. Suspect pulmonary artery enlargement, as can be seen with pulmonary arterial hypertension. Electronically Signed   By: Jeronimo Greaves M.D.   On: 11/10/2016 13:20     STUDIES:  CXR 7/3 > Moderate congestive heart failure. Appropriate position of endotracheal tube. Suspect pulmonary artery enlargement, as can be seen with pulmonary arterial hypertension.  CULTURES: Sputum 7/3 >>  ANTIBIOTICS: None.   SIGNIFICANT EVENTS: 7/3 > Presents to ED with Dyspnea   LINES/TUBES: ETT 7/3 >>  DISCUSSION: 66 year old male presents severe dyspnea. Intubated. EF 25%. BNP 1932. Noted to have flash pulmonary edema.   ASSESSMENT / PLAN:  PULMONARY A: Acute Hypoxic Respiratory Failure in setting of pulmonary edema  H/O COPD P:   Vent Support Wean in AM Trend CXR and ABG Scheduled Duoneb  VAP Bundle   CARDIOVASCULAR A:  Acute on Chronic systolic Heart Failure - EF 25% 07/13/2016  Ischemic Cardiomyopathy  H/O  A.Fib on home Xarelto, HTN  -Hypotension in ED s/p sedation > given neo push  -BNP 1932.3 P:  Cardiac Monitoring  Maintain MAP >60 Heparin Gtt > hold Xarelto  Hold home HTN medications  Dopamine at 5 mcg   RENAL A:   Acute Renal Failure  P:   Trend BMP Replace electrolytes as needed  Lasix gtt 4 mcg   GASTROINTESTINAL A:   GERD P:   NPO PPI  HEMATOLOGIC A:   No issues > on chronic anticoagulation  P:  Trend CBC Maintain Hbg >7 Heparin per pharmacy   INFECTIOUS A:   No issues  P:   Trend WBC and Fever Curve   ENDOCRINE A:   Hyperglycemia    P:   Trend Glucose  SSI  NEUROLOGIC A:   Acute Encephalopathy in setting of sedation  Acute Cocaine Toxicity ?  H/O Cocaine Abuse, CVA  P:  RASS goal: 0/-1 UDS pending  Propofol D/C due to hypotension  Fentanyl and Versed PRN    FAMILY  - Updates: no family at bedside   - Inter-disciplinary family meet or Palliative Care meeting due by:  11/17/2016   CC Time: 54 minutes   Jovita Kussmaul, AGACNP-BC [facility]  Pgr: [phone]  PCCM Pgr: [phone]  STAFF NOTE: Cindi Carbon, MD FACP have personally reviewed patient's available data, including medical history, events of note, physical examination and test results as part of my evaluation. I have discussed with resident/NP and other care providers such as pharmacist, RN and RRT. In addition, I personally evaluated patient and elicited key findings of: awake, alert, calm, on mech vent, crackles coarse bilateral, no edema lowers, s1 s2 RRR not tachy, distant, no chest pain, feels better with support vent, pcxr which I reviewed showed pulm edema with fluid fissure rt and large lung volumes, he admits to cocaine use and now with chf acute on chronic exacerbation with pulm edema, need to r/o ischemia, he arrives on dopamine with HR 66, would like to wean this to off to map goal 60 with known EF 25%, if ANY ectopy will dc dop and use  neo, ABg reviewed, keep MV now as corrected ph, lasix drip, if limited output would change to bolus lasix q6h,. Chem in am, trop assessment, avoid BB, add tube feeds early as he appears to have low muscle mass and cachexia malnurished, BNP is impressive, goal net neg 2 liters, he has adequate PIV, may need cvl placed, would like to avoid this if able, heparin for now, holding xarelto,  Needs to stop using cocaine, ECG is chronic RBBB, rass -2 goal for now The patient is critically ill with multiple organ systems failure and requires high complexity decision making for assessment and support, frequent evaluation and titration of therapies, application of advanced monitoring technologies and extensive interpretation of multiple databases.   Critical Care Time devoted to patient care services described in this note is 35 Minutes. This time reflects time of care of this signee: Rory Percy, MD FACP. This critical care time does not reflect procedure time, or teaching time or supervisory time of PA/NP/Med student/Med Resident etc but could involve care discussion time. Rest per NP/medical resident whose note is outlined above and that I agree with   Mcarthur Rossetti. Tyson Alias, MD, FACP Pgr: [phone] [facility] 11/10/2016 4:07 PM

## 2016-11-10 NOTE — ED Notes (Signed)
Pt sitting up, restless. NP aware,  Will give PRN fentanyl and versed.

## 2016-11-10 NOTE — ED Notes (Signed)
Placed condom cath on pt to obtain urine.

## 2016-11-10 NOTE — ED Notes (Signed)
Pt still moving, increasing dose of propofol

## 2016-11-10 NOTE — Progress Notes (Signed)
ANTICOAGULATION CONSULT NOTE - Follow Up Consult  Pharmacy Consult for Heparin (Xarelto on hold) Indication: atrial fibrillation  No Known Allergies  Patient Measurements: Height: 6' 0.5" (184.2 cm) Weight: 145 lb (65.8 kg) IBW/kg (Calculated) : 78.75  Vital Signs: Temp: 97.4 F (36.3 C) (07/03 1955) Temp Source: Axillary (07/03 1955) BP: 109/76 (07/03 2300) Pulse Rate: 59 (07/03 2300)  Labs:  Recent Labs  11/10/16 1254 11/10/16 1326 11/10/16 1514 11/10/16 1519 11/10/16 2154  HGB 15.6 14.2  --   --   --   HCT 46.0 44.3  --   --   --   PLT  --  147*  --   --   --   APTT  --   --  30  --  69*  LABPROT  --   --  13.7  --   --   INR  --   --  1.04  --   --   HEPARINUNFRC  --   --  0.34  --   --   CREATININE 1.30* 1.39*  --   --   --   TROPONINI  --   --   --  <0.03  --     Estimated Creatinine Clearance: 50.6 mL/min (A) (by C-G formula based on SCr of 1.39 mg/dL (H)).  Assessment: On heparin while Xarelto on hold during critical illness, aPTT is therapeutic x 1, using aPTT to dose for now given Xarelto influence on anti-Xa levels.   Goal of Therapy:  Heparin level 0.3-0.7 units/ml aPTT 66-102 seconds Monitor platelets by anticoagulation protocol: Yes   Plan:  -Cont heparin at 1000 units/hr -aPTT/HL with AM labs  Abran DukeLedford, Jaymie Misch 11/10/2016,11:39 PM

## 2016-11-10 NOTE — ED Provider Notes (Signed)
[facility] DEPT Provider Note   CSN: [id number] Arrival date & time: 11/10/16  1224     History   Chief Complaint Chief Complaint  Patient presents with  . Respiratory Distress    HPI [name] is a 66 y.o. male.  The history is provided by the patient and medical records. The history is limited by the condition of the patient.  Shortness of Breath  This is a recurrent problem. The average episode lasts 1 day. The problem occurs continuously.The current episode started 3 to 5 hours ago. The problem has been rapidly worsening. Associated symptoms include wheezing. Pertinent negatives include no fever, no headaches, no rhinorrhea, no cough, no chest pain, no abdominal pain and no leg pain. He has tried beta-agonist inhalers for the symptoms. The treatment provided no relief. He has had prior hospitalizations. He has had prior ICU admissions. Associated medical issues include chronic lung disease, CAD and heart failure.    Past Medical History:  Diagnosis Date  . Atrial fibrillation ([place])   . CAD (coronary artery disease)    nonobstructive  . CHF (congestive heart failure) ([place])   . Chronic systolic heart failure ([place])   . COPD (chronic obstructive pulmonary disease) ([place])   . Drug abuse    cocaine  . Heartburn   . Hypertension   . Stroke ([place])   . Substance abuse     Patient Active Problem List   Diagnosis Date Noted  . Substance abuse   . Heartburn   . Hypertension   . Acute pulmonary edema (HCC)   . Respiratory failure ([place])   . Acute respiratory failure with hypercapnia ([place]) 08/06/2016  . Atrial fibrillation ([place]) 07/13/2016  . Chronic bronchitis ([place]) 07/13/2016  . Respiratory failure ([place]) 07/09/2016  . Pneumothorax on right   . 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 COPD exacerbation ([place])   . History of CVA (cerebrovascular accident)   . Acute on chronic systolic heart failure ([place]) 07/08/2011  . Hypokalemia 07/07/2011  . Flash  pulmonary edema ([place]) 07/06/2011  . 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 Abnormal EKG 07/06/2011  . Chronic systolic congestive heart failure ([place]) 05/05/2011  . Acute respiratory failure with hypoxia ([place]) 05/03/2011  . SUBSTANCE ABUSE, MULTIPLE 03/25/2010  . Essential hypertension 03/25/2010    Past Surgical History:  Procedure Laterality Date  . JOINT REPLACEMENT  L knee       Home Medications    Prior to Admission medications   Medication Sig Start Date End Date Taking? Authorizing Provider  Albuterol Sulfate 108 (90 Base) MCG/ACT AEPB Inhale 1 puff into the lungs every 6 (six) hours as needed (wheezing/shortness of breath). 10/05/16   Verlee Monte, MD  atorvastatin (LIPITOR) 40 MG tablet Take 1 tablet (40 mg total) by mouth daily at 6 PM. 07/13/16   Arrien, Jimmy Picket, MD  furosemide (LASIX) 40 MG tablet Take 1 tablet (40 mg total) by mouth daily. 10/05/16   Verlee Monte, MD  ipratropium-albuterol (DUONEB) 0.5-2.5 (3) MG/3ML SOLN Take 3 mLs by nebulization every 6 (six) hours as needed. 07/13/16   Arrien, Jimmy Picket, MD  lisinopril (PRINIVIL,ZESTRIL) 2.5 MG tablet Take 1 tablet (2.5 mg total) by mouth daily. 10/05/16   Verlee Monte, MD  potassium chloride (K-DUR) 10 MEQ tablet Take 1 tablet (10 mEq total) by mouth daily. 07/13/16   Arrien, Jimmy Picket, MD  rivaroxaban (XARELTO) 20 MG TABS tablet Take 1 tablet (20 mg total) by mouth daily with supper. 10/05/16   Elmahi,  Rae Lips, MD    Family History Family History  Problem Relation Age of Onset  . Malignant hyperthermia Mother     Social History Social History  Substance Use Topics  . Smoking status: Former Smoker    Types: Cigarettes    Quit date: 09/03/2016  . Smokeless tobacco: Never Used  . Alcohol use Yes     Allergies   Patient has no known allergies.   Review of Systems Review of Systems  Unable to perform ROS: Severe respiratory distress  Constitutional: Negative for chills and  fever.  HENT: Negative for congestion and rhinorrhea.   Respiratory: Positive for shortness of breath and wheezing. Negative for cough, chest tightness and stridor.   Cardiovascular: Negative for chest pain.  Gastrointestinal: Negative for abdominal pain.  Genitourinary: Negative for dysuria and flank pain.  Musculoskeletal: Negative for back pain.  Neurological: Negative for headaches.     Physical Exam Updated Vital Signs BP (!) 156/124 (BP Location: Left Arm)   Temp (!) 95.7 F (35.4 C) (Oral)   SpO2 96%   Physical Exam  Constitutional: He appears well-developed. He appears distressed.  HENT:  Head: Normocephalic and atraumatic.  Nose: Nose normal.  Eyes: Conjunctivae are normal. Pupils are equal, round, and reactive to light.  Neck: Neck supple.  Cardiovascular: Intact distal pulses.  Tachycardia present.   No murmur heard. Pulmonary/Chest: Accessory muscle usage present. No stridor. Tachypnea noted. He is in respiratory distress. He has wheezes. He has rales.  Abdominal: Soft. There is no tenderness.  Musculoskeletal: He exhibits no edema.  Neurological: He is alert. No sensory deficit. GCS eye subscore is 2. GCS verbal subscore is 4. GCS motor subscore is 6.  GCS was initially 12 however it rapidly declined during evaluation. Patient then became unresponsive to pain. Decision was made to intubate.  Skin: Skin is warm. He is diaphoretic. No erythema.  Psychiatric: He has a normal mood and affect.  Nursing note and vitals reviewed.    ED Treatments / Results  Labs (all labs ordered are listed, but only abnormal results are displayed) Labs Reviewed  COMPREHENSIVE METABOLIC PANEL - Abnormal; Notable for the following:       Result Value   CO2 21 (*)    Glucose, Bld 178 (*)    Creatinine, Ser 1.39 (*)    GFR calc non Af Amer 52 (*)    All other components within normal limits  CBC WITH DIFFERENTIAL/PLATELET - Abnormal; Notable for the following:    Platelets 147 (*)     All other components within normal limits  BRAIN NATRIURETIC PEPTIDE - Abnormal; Notable for the following:    B Natriuretic Peptide 1,932.3 (*)    All other components within normal limits  ACETAMINOPHEN LEVEL - Abnormal; Notable for the following:    Acetaminophen (Tylenol), Serum <10 (*)    All other components within normal limits  HEPATIC FUNCTION PANEL - Abnormal; Notable for the following:    Total Protein 6.4 (*)    All other components within normal limits  I-STAT CHEM 8, ED - Abnormal; Notable for the following:    Creatinine, Ser 1.30 (*)    Glucose, Bld 137 (*)    Calcium, Ion 1.06 (*)    All other components within normal limits  I-STAT VENOUS BLOOD GAS, ED - Abnormal; Notable for the following:    pO2, Ven 123.0 (*)    All other components within normal limits  I-STAT ARTERIAL BLOOD GAS, ED - Abnormal; Notable for the  following:    pO2, Arterial 71.0 (*)    All other components within normal limits  CULTURE, RESPIRATORY (NON-EXPECTORATED)  SALICYLATE LEVEL  ETHANOL  PROTIME-INR  HEPARIN LEVEL (UNFRACTIONATED)  APTT  TROPONIN I  URINALYSIS, ROUTINE W REFLEX MICROSCOPIC  RAPID URINE DRUG SCREEN, HOSP PERFORMED  BLOOD GAS, ARTERIAL  TROPONIN I  TROPONIN I  LACTIC ACID, PLASMA  LACTIC ACID, PLASMA  APTT  MAGNESIUM  PHOSPHORUS  I-STAT TROPOININ, ED    EKG  EKG Interpretation  Date/Time:  Tuesday November 10 2016 12:27:21 EDT Ventricular Rate:  115 PR Interval:    QRS Duration: 185 QT Interval:  360 QTC Calculation: 498 R Axis:   127 Text Interpretation:  Sinus tachycardia Ventricular premature complex Left atrial enlargement RBBB and LPFB When compared to prior, faster rate.  No STEMI Confirmed by Antony Blackbird [id number]) on 11/10/2016 12:41:26 PM       Radiology Dg Chest Port 1 View  Result Date: 11/10/2016 CLINICAL DATA:  Endotracheal tube placement. EXAM: PORTABLE CHEST 1 VIEW COMPARISON:  10/08/2016 FINDINGS: Endotracheal tube terminates 6.7 cm above  carina.Nasogastric tube extends beyond the inferior aspect of the film. Midline trachea. Mild cardiomegaly. Prominence of the pulmonary arteries. No pleural fluid. No pneumothorax. Hyperinflation. Moderate interstitial edema. Thickening of the right minor fissure. No lobar consolidation. IMPRESSION: Moderate congestive heart failure. Appropriate position of endotracheal tube. Suspect pulmonary artery enlargement, as can be seen with pulmonary arterial hypertension. Electronically Signed   By: Abigail Miyamoto M.D.   On: 11/10/2016 13:20    Procedures Procedure Name: Intubation Date/Time: 11/10/2016 4:41 PM Performed by: Waynetta Pean Pre-anesthesia Checklist: Patient identified, Emergency Drugs available, Suction available and Timeout performed Oxygen Delivery Method: Non-rebreather mask Preoxygenation: Pre-oxygenation with 100% oxygen Intubation Type: Rapid sequence Ventilation: Mask ventilation without difficulty Laryngoscope Size: Glidescope and 3 Grade View: Grade I Tube size: 7.5 mm Number of attempts: 1 Placement Confirmation: ETT inserted through vocal cords under direct vision,  Positive ETCO2,  CO2 detector and Breath sounds checked- equal and bilateral Tube secured with: ETT holder Dental Injury: Teeth and Oropharynx as per pre-operative assessment       (including critical care time)  CRITICAL CARE Performed by: Gwenyth Allegra Ernesta Trabert Total critical care time: 60 minutes Critical care time was exclusive of separately billable procedures and treating other patients. Critical care was necessary to treat or prevent imminent or life-threatening deterioration. Critical care was time spent personally by me on the following activities: development of treatment plan with patient and/or surrogate as well as nursing, discussions with consultants, evaluation of patient's response to treatment, examination of patient, obtaining history from patient or surrogate, ordering and performing  treatments and interventions, ordering and review of laboratory studies, ordering and review of radiographic studies, pulse oximetry and re-evaluation of patient's condition.   Medications Ordered in ED Medications  etomidate (AMIDATE) injection (20 mg Intravenous Given 11/10/16 1244)  rocuronium (ZEMURON) injection (70 mg Intravenous Given 11/10/16 1245)  midazolam (VERSED) injection 2 mg (2 mg Intravenous Given 11/10/16 1529)  midazolam (VERSED) injection 2 mg (not administered)  0.9 %  sodium chloride infusion (not administered)  pantoprazole (PROTONIX) injection 40 mg (not administered)  ipratropium-albuterol (DUONEB) 0.5-2.5 (3) MG/3ML nebulizer solution 3 mL (3 mLs Nebulization Given 11/10/16 1537)  insulin aspart (novoLOG) injection 2-6 Units (not administered)  DOPamine (INTROPIN) 800 mg in dextrose 5 % 250 mL (3.2 mg/mL) infusion (10 mcg/kg/min  65.8 kg Intravenous Rate/Dose Change 11/10/16 1552)  furosemide (LASIX) 250 mg in dextrose 5 %  250 mL (1 mg/mL) infusion (not administered)  fentaNYL (SUBLIMAZE) injection 50 mcg (not administered)  fentaNYL 2520mg in NS 2598m(1073mml) infusion-PREMIX (not administered)  fentaNYL (SUBLIMAZE) bolus via infusion 50 mcg (not administered)  heparin ADULT infusion 100 units/mL (25000 units/250m38mdium chloride 0.45%) (not administered)  feeding supplement (VITAL HIGH PROTEIN) liquid 1,000 mL (not administered)  feeding supplement (PRO-STAT SUGAR FREE 64) liquid 30 mL (not administered)  ipratropium (ATROVENT) nebulizer solution 0.5 mg (0.5 mg Nebulization Given 11/10/16 1231)  furosemide (LASIX) injection 40 mg (40 mg Intravenous Given 11/10/16 1406)  fentaNYL (SUBLIMAZE) 100 MCG/2ML injection (  Duplicate 7/3/4/2/7056237idazolam (VERSED) 2 MG/2ML injection (  Duplicate 7/3/6/2/8351517HENYLephrine 40 mcg/ml in normal saline Adult IV Push Syringe (200 mcg Intravenous Given 11/10/16 1450)  phenylephrine 0.4-0.9 MG/10ML-% injection (  Duplicate 7/3/6/1/6007371 PHENYLephrine 40 mcg/ml in normal saline Adult IV Push Syringe (200 mcg Intravenous Given 11/10/16 1501)     Initial Impression / Assessment and Plan / ED Course  I have reviewed the triage vital signs and the nursing notes.  Pertinent labs & imaging results that were available during my care of the patient were reviewed by me and considered in my medical decision making (see chart for details).     [name] 65 y19. male with a past medical history significant for hypertension, multiple intubations for COPD exacerbations and flash pulmonary edema episodes, CAD, atrial fibrillation on Xarelto, and stroke who presents with shortness of breath and respiratory distress. Patient is brought in by EMS who report that patient was in normal health until this morning when he developed shortness of breath. Patient is having significant wheezing. Patient is being bagged with a Ambu bag upon arrival.  EMS reports the patient was very tight and wheezing on their arrival. Hypoxic. They gave patient magnesium, Solu-Medrol, and albuterol. Patient's wheezing has begun to improve.  On arrival, patient's mental status is confused. He is able to follow minimal commands like wiggling toes.  Patient given continuous albuterol with some improvement in wheezing however, patient began to become more somnolent. Patient was attempted on BiPAP but failed with somnolence. Patient's GCS continued to decline and he was unable to follow any commands or open his eyes. The only words he would speak are "intubate me".  Patient was intubated for airway protection and hypoxia.  EKG showed tachycardia. Patient was sedated and then had soft blood pressures. Patient switched from propofol to fentanyl and Versed. Blood pressure began to improve.  Normal chest x-ray showed appropriate placement of endotracheal tube but did reveal pulmonary edema. Suspect this is the cause of respiratory failure.  [facility] was called  and they will met patient for further management of flash pulmonary edema and respiratory failure. They assumed care of the patient and began managing blood pressures and ventilator management.  Patient admitted without further complication in ED.   Final Clinical Impressions(s) / ED Diagnoses   Final diagnoses:  Acute respiratory failure with hypoxia (HCC)     Clinical Impression: 1. Acute respiratory failure with hypoxia (HCC)[place]2. [facility]  Disposition: Admit to Critical care service    Duwayne Matters, ChriGwenyth Allegra 11/10/16 [id number] [phone]

## 2016-11-10 NOTE — ED Notes (Signed)
Preparing for intubation

## 2016-11-10 NOTE — ED Notes (Signed)
CC NP aware of BP

## 2016-11-10 NOTE — ED Notes (Signed)
Report given to RN on 2M 

## 2016-11-10 NOTE — Procedures (Signed)
Intubation Procedure Note [name] [id number] October 16, 1950  Procedure: Intubation Indications: Respiratory insufficiency  Procedure Details Consent: Risks of procedure as well as the alternatives and risks of each were explained to the (patient/caregiver).  Consent for procedure obtained. Time Out: Verified patient identification, verified procedure, site/side was marked, verified correct patient position, special equipment/implants available, medications/allergies/relevent history reviewed, required imaging and test results available.  Performed  Maximum sterile technique was used including gloves, gown, hand hygiene and mask.  MAC and 3    Evaluation Hemodynamic Status: BP stable throughout; O2 sats: stable throughout Patient's Current Condition: stable Complications: No apparent complications Patient did tolerate procedure well. Chest X-ray ordered to verify placement.  CXR: pending.   Pt intubated using glidescope # 3 blade with 7.5 ett secured at 23 at the top lip. Positive color change noted on etco2, bilateral BS, Direct visualization, CXR pending. Pt stable throughout with no complications. RT will continue to monitor.   Jesse Sans 11/10/2016

## 2016-11-10 NOTE — Progress Notes (Signed)
Pt transported to 77M ICU via vent  w/ no apparent complications.  Unit RT aware

## 2016-11-10 NOTE — ED Triage Notes (Signed)
EMS called for resp distress. Pt not "moving air", able to only say 2-3 word sentences upon EMS arrival. Pt tripoding. 98% on room air. EMS gave 2g MAg, 5 abuterol, 0.5 atrovent, 125 solu medrol. Pt on nonrebreather, became minimally responsive. EMS began bagging. HR 120s, BP 130/80. Upon arrival to ED, pt not following commands.

## 2016-11-10 NOTE — Progress Notes (Signed)
eLink Physician-Brief Progress Note Patient Name: Martin AbedMelvin L Steinhoff DOB: 03/14/1951 MRN: [id number]   Date of Service  11/10/2016  HPI/Events of Note  Pulling at tubes lines despite sedation  eICU Interventions  Soft wrist restraints     Intervention Category Minor Interventions: Agitation / anxiety - evaluation and management  Max FickleDouglas McQuaid 11/10/2016, 11:42 PM

## 2016-11-11 ENCOUNTER — Inpatient Hospital Stay (HOSPITAL_COMMUNITY): Payer: Medicare Other

## 2016-11-11 DIAGNOSIS — J9601 Acute respiratory failure with hypoxia: Secondary | ICD-10-CM

## 2016-11-11 LAB — BASIC METABOLIC PANEL
Anion gap: 11 (ref 5–15)
BUN: 17 mg/dL (ref 6–20)
CHLORIDE: 101 mmol/L (ref 101–111)
CO2: 25 mmol/L (ref 22–32)
CREATININE: 1.18 mg/dL (ref 0.61–1.24)
Calcium: 9.3 mg/dL (ref 8.9–10.3)
GFR calc Af Amer: >60 mL/min (ref >60)
GFR calc non Af Amer: >60 mL/min (ref >60)
Glucose, Bld: 139 mg/dL — ABNORMAL HIGH (ref 65–99)
Potassium: 3.2 mmol/L — ABNORMAL LOW (ref 3.5–5.1)
Sodium: 137 mmol/L (ref 135–145)

## 2016-11-11 LAB — PHOSPHORUS
Phosphorus: 2.5 mg/dL (ref 2.5–4.6)
Phosphorus: 3.1 mg/dL (ref 2.5–4.6)
Phosphorus: 2.8 mg/dL (ref 2.5–4.6)

## 2016-11-11 LAB — GLUCOSE, CAPILLARY
GLUCOSE-CAPILLARY: 136 mg/dL — AB (ref 65–99)
GLUCOSE-CAPILLARY: 73 mg/dL (ref 65–99)
GLUCOSE-CAPILLARY: 102 mg/dL — AB (ref 65–99)
Glucose-Capillary: 131 mg/dL — ABNORMAL HIGH (ref 65–99)
Glucose-Capillary: 100 mg/dL — ABNORMAL HIGH (ref 65–99)
Glucose-Capillary: 54 mg/dL — ABNORMAL LOW (ref 65–99)

## 2016-11-11 LAB — CBC
HCT: 41.8 % (ref 39.0–52.0)
Hemoglobin: 13.4 g/dL (ref 13.0–17.0)
MCH: 27.2 pg (ref 26.0–34.0)
MCHC: 32.1 g/dL (ref 30.0–36.0)
MCV: 85 fL (ref 78.0–100.0)
PLATELETS: 178 10*3/uL (ref 150–400)
RBC: 4.92 MIL/uL (ref 4.22–5.81)
RDW: 15.1 % (ref 11.5–15.5)
WBC: 5.9 10*3/uL (ref 4.0–10.5)

## 2016-11-11 LAB — MAGNESIUM
Magnesium: 2.2 mg/dL (ref 1.7–2.4)
Magnesium: 2.2 mg/dL (ref 1.7–2.4)
Magnesium: 2.3 mg/dL (ref 1.7–2.4)

## 2016-11-11 LAB — APTT: APTT: 68 s — AB (ref 24–36)

## 2016-11-11 LAB — HEPARIN LEVEL (UNFRACTIONATED): Heparin Unfractionated: 0.55 IU/mL (ref 0.30–0.70)

## 2016-11-11 LAB — TROPONIN I: Troponin I: 0.03 ng/mL (ref <0.03)

## 2016-11-11 MED ORDER — VITAL 1.5 CAL PO LIQD
1000.0000 mL | ORAL | Status: DC
  Administered 2016-11-11 – 2016-11-12 (×2): 1000 mL
  Filled 2016-11-11 (×7): qty 1000

## 2016-11-11 MED ORDER — PRO-STAT SUGAR FREE PO LIQD
30.0000 mL | Freq: Three times a day (TID) | ORAL | Status: DC
  Administered 2016-11-11 – 2016-11-12 (×4): 30 mL
  Filled 2016-11-11 (×5): qty 30

## 2016-11-11 MED ORDER — SODIUM CHLORIDE 0.9 % IV SOLN
1.0000 mg/h | INTRAVENOUS | Status: DC
  Administered 2016-11-11: 3 mg/h via INTRAVENOUS
  Administered 2016-11-11: 2 mg/h via INTRAVENOUS
  Administered 2016-11-12: 3 mg/h via INTRAVENOUS
  Filled 2016-11-11 (×3): qty 10

## 2016-11-11 MED ORDER — DEXTROSE 10 % IV SOLN
INTRAVENOUS | Status: DC
  Administered 2016-11-12: via INTRAVENOUS
  Administered 2016-11-13: 20 mL/h via INTRAVENOUS

## 2016-11-11 MED ORDER — CHLORHEXIDINE GLUCONATE 0.12% ORAL RINSE (MEDLINE KIT)
15.0000 mL | Freq: Two times a day (BID) | OROMUCOSAL | Status: DC
  Administered 2016-11-11 – 2016-11-13 (×5): 15 mL via OROMUCOSAL

## 2016-11-11 MED ORDER — POTASSIUM CHLORIDE 20 MEQ/15ML (10%) PO SOLN
30.0000 meq | ORAL | Status: AC
  Administered 2016-11-11 (×2): 30 meq
  Filled 2016-11-11 (×2): qty 30

## 2016-11-11 MED ORDER — ORAL CARE MOUTH RINSE
15.0000 mL | Freq: Four times a day (QID) | OROMUCOSAL | Status: DC
  Administered 2016-11-11 – 2016-11-13 (×10): 15 mL via OROMUCOSAL

## 2016-11-11 NOTE — Progress Notes (Addendum)
eLink Physician-Brief Progress Note Patient Name: Martin AbedMelvin L Vazquez DOB: 09/15/1950 MRN: [id number]   Date of Service  11/11/2016  HPI/Events of Note  Hypoglycemia, while on TF at goal. On lasix drip.   eICU Interventions  Start D10 at 20 cc/hr     Intervention Category Intermediate Interventions: Hyperglycemia - evaluation and treatment  Shane Crutchradeep Korah Hufstedler 11/11/2016, 11:39 PM

## 2016-11-11 NOTE — Progress Notes (Signed)
eLink Physician-Brief Progress Note Patient Name: Martin AbedMelvin L Vazquez DOB: 02/10/1951 MRN: [id number]   Date of Service  11/11/2016  HPI/Events of Note  Agitation, pulling at tubes, lines desipte q2h versed infusion Hx of polysubstance abuse  eICU Interventions  Versed infusion, low dose     Intervention Category Major Interventions: Change in mental status - evaluation and management  Max FickleDouglas Makaylah Oddo 11/11/2016, 3:42 AM

## 2016-11-11 NOTE — Progress Notes (Signed)
ANTICOAGULATION CONSULT NOTE - Follow Up Consult  Pharmacy Consult for Heparin (Xarelto on hold) Indication: atrial fibrillation  No Known Allergies  Patient Measurements: Height: 6' 0.5" (184.2 cm) Weight: 130 lb 8.2 oz (59.2 kg) IBW/kg (Calculated) : 78.75  Vital Signs: Temp: 97.9 F (36.6 C) (07/04 0745) Temp Source: Oral (07/04 0745) BP: 98/70 (07/04 0800) Pulse Rate: 62 (07/04 0600)  Labs:  Recent Labs  11/10/16 1254 11/10/16 1326 11/10/16 1514 11/10/16 1519 11/10/16 2154 11/11/16 0212  HGB 15.6 14.2  --   --   --  13.4  HCT 46.0 44.3  --   --   --  41.8  PLT  --  147*  --   --   --  178  APTT  --   --  30  --  69* 68*  LABPROT  --   --  13.7  --   --   --   INR  --   --  1.04  --   --   --   HEPARINUNFRC  --   --  0.34  --   --  0.55  CREATININE 1.30* 1.39*  --   --   --  1.18  TROPONINI  --   --   --  <0.03 0.03* 0.03*    Estimated Creatinine Clearance: 53.7 mL/min (by C-G formula based on SCr of 1.18 mg/dL).  Assessment: On heparin for atrial fibrillation while Xarelto on hold during critical illness, aPTT is therapeutic x 2, using aPTT to dose for now given Xarelto influence on anti-Xa levels.  CBC is stable with no bleeding noted.  Goal of Therapy:  Heparin level 0.3-0.7 units/ml aPTT 66-102 seconds Monitor platelets by anticoagulation protocol: Yes   Plan:  -Cont heparin at 1000 units/hr -aPTT/HL and CBC with AM labs  Estella HuskMichelle Timia Casselman, Pharm.D., BCPS, AAHIVP Clinical Pharmacist Phone: [phone] or [phone] 11/11/2016, 8:43 AM

## 2016-11-11 NOTE — Progress Notes (Signed)
[facility] Attending Note  ADMISSION DATE:  11/10/2016  CONSULTATION DATE:  11/10/2016  REFERRING MD:  Dr. Sherry Ruffing   CHIEF COMPLAINT:  Dyspnea   Presenting HPI:  66 y.o. male with PMH of COPD, systolic HF (EF 58%), and polysubstance abuse (cocaine). Presents to ED on 7/3 with severe shortness breath by EMS. Upon arrival to ED patient requesting to be intubated as he felt he was suffering. Moments later he became minimally response and was immediately intubated. ABG 7.26/59/123. WBC 9.5, Troponin 0.03, BNP 1932.3. PCCM asked to admit. Recent admission on 3/1 and 3/29 with acute hypoxic respiratory failure in setting of pulmonary edema requiring intubation. During both admissions it was documented that he testes positive for cocaine.    Subjective:  Patient more agitated overnight and pulling at tubes/lines.  Review of Systems:  Unable to obtain given intubation & sedation.   Vent Mode: PRVC FiO2 (%):  [40 %] 40 % Set Rate:  [10 bmp-20 bmp] 20 bmp Vt Set:  [630 mL] 630 mL PEEP:  [5 cmH20-8 cmH20] 5 cmH20 Plateau Pressure:  [13 KDX83-38 cmH20] 16 cmH20  Temp:  [95.7 F (35.4 C)-98.6 F (37 C)] 97.9 F (36.6 C) (07/04 0745) Pulse Rate:  [58-122] 62 (07/04 0600) Resp:  [14-28] 20 (07/04 0900) BP: (75-167)/(50-140) 98/67 (07/04 0900) SpO2:  [85 %-100 %] 97 % (07/04 0844) FiO2 (%):  [40 %] 40 % (07/04 0844) Weight:  [130 lb 8.2 oz (59.2 kg)-145 lb (65.8 kg)] 130 lb 8.2 oz (59.2 kg) (07/04 0440)   General:  No family at bedside. Intubated. No distress. Integument:  Warm & dry. No rash on exposed skin. HEENT:  Moist mucus memebranes. No scleral icterus. Endotracheal tube in place. Neurological:  Pupils symmetric. Sedated but with eyes open. Spontaneously moving all 4 extremities. Musculoskeletal:  No joint effusion or erythema appreciated. Symmetric muscle bulk that is decreased. Pulmonary:  Symmetric chest wall rise on ventilator. Coarse breath sounds  bilaterally. Cardiovascular:  Regular rate. No appreciable JVD. Normal S1 & S2. Abdomen:  Soft. Nondistended. Hypoactive bowel sounds.  LINES/TUBES: OETT 7.5 7/3 >>> OGT 7/3 >>> PIV  CBC Latest Ref Rng & Units 11/11/2016 11/10/2016 11/10/2016  WBC 4.0 - 10.5 K/uL 5.9 9.5 -  Hemoglobin 13.0 - 17.0 g/dL 13.4 14.2 15.6  Hematocrit 39.0 - 52.0 % 41.8 44.3 46.0  Platelets 150 - 400 K/uL 178 147(L) -   BMP Latest Ref Rng & Units 11/11/2016 11/10/2016 11/10/2016  Glucose 65 - 99 mg/dL 139(H) 178(H) 137(H)  BUN 6 - 20 mg/dL _0 Creatinine 0.61 - 1.24 mg/dL 1.18 1.39(H) 1.30(H)  Sodium 135 - 145 mmol/L 137 136 138  Potassium 3.5 - 5.1 mmol/L 3.2(L) 4.1 4.1  Chloride 101 - 111 mmol/L 101 103 106  CO2 22 - 32 mmol/L 25 21(L) -  Calcium 8.9 - 10.3 mg/dL 9.3 9.1 -   Hepatic Function Latest Ref Rng & Units 11/10/2016 11/10/2016 01/29/2016  Total Protein 6.5 - 8.1 g/dL 6.4(L) 6.5 6.2(L)  Albumin 3.5 - 5.0 g/dL 3.7 3.7 3.6  AST 15 - 41 U/L 39 33 28  ALT 17 - 63 U/L _1 Alk Phosphatase 38 - 126 U/L 109 118 66  Total Bilirubin 0.3 - 1.2 mg/dL 0.9 1.0 0.5  Bilirubin, Direct 0.1 - 0.5 mg/dL 0.3 - -    IMAGING/STUDIES: UDS 7/3:  Positive Cocaine & Benzos PORT CXR 7/4:  Personally reviewed by me. Endotracheal tube and enteric feeding tube  in good position. No focal opacity appreciated. Some element of hyperinflation.  MICROBIOLOGY: MRSA PCR 7/3:  Negative   ANTIBIOTICS: None.   SIGNIFICANT EVENTS: 07/03 - Admit  ASSESSMENT/PLAN:  66 y.o. male presenting with dyspnea and known systolic congestive heart failure felt to be secondary to flash pulmonary edema causing acute hypoxic respiratory failure in the setting of cocaine use. Started on Lasix drip for diuresis.  1. Acute hypoxic respiratory failure:  Continuing full ventilator support. Daily SBT and pressure support wean. Lasix drip for diuresis as renal function allows. 2. Flash pulmonary edema:  Checking transthoracic echocardiogram.  Continuing diuresis as above. 3. Drug abuse: Plan for cessation education after extubation. 4. Hypokalemia: Status post replacement. Repeat electrolytes in the morning. 5. Acute encephalopathy: Continuing Versed drip for sedation and patient comfort. Fentanyl IV as needed. 6. History of atrial fibrillation: Monitoring patient on telemetry. 7. History of essential hypertension: Holding antihypertensive regimen. 8. History of COPD: Continuing Duonebs scheduled every 6 hours. No signs of acute exacerbation.  Prophylaxis:  SCDs & Protonix IV qhs.  Diet:  NPO. Continuing tube feedings.  Code Status:  Full Code per previous physician discussion.  Disposition:  Remains critically ill in the ICU.  Family Update:  No family at bedside at the time of my rounds.   I have personally spent a total of 31 minutes of critical care time today caring for the patient & reviewing the patient's electronic medical record.  Sonia Baller Ashok Cordia, M.D. [facility] Pager:  [phone] After 3pm or if no response, call [phone] 9:22 AM 11/11/16

## 2016-11-11 NOTE — Progress Notes (Signed)
[facility] FOR AM LAB REPLACEMENT ONLY  The patient does [facility] based on the criteria listed below:   1. Is GFR >/= 40 ml/min? Yes.    Patient's GFR today is 60 2. Is urine output >/= 0.5 ml/kg/hr for the last 6 hours? Yes.   Patient's UOP is 1.4 ml/kg/hr 3. Is BUN < 60 mg/dL? Yes.    Patient's BUN today is 17 4. Abnormal electrolyte(s): K - 3.2  5. Ordered repletion with: Oral 30Meq every 4 hours for 2 doses 6. If a panic level lab has been reported, has the CCM MD in charge been notified? Yes.  .   Physician:  Cheral BayMcQuaid  MACK, BRIAN P 11/11/2016 6:01 AM

## 2016-11-11 NOTE — Progress Notes (Signed)
Initial Nutrition Assessment  DOCUMENTATION CODES:   Severe malnutrition in context of chronic illness, Underweight  INTERVENTION:   D/C Vital High Protein  Vital 1.5 @ 40 ml/hr (960 ml/day) 30 ml Prostat TID Provides: 1740 kcal, 109 grams protein and 733 ml free water.    NUTRITION DIAGNOSIS:   Malnutrition (severe) related to chronic illness (COPD) as evidenced by severe depletion of body fat, severe depletion of muscle mass.  GOAL:   Patient will meet greater than or equal to 90% of their needs  MONITOR:   TF tolerance, Vent status, Labs  REASON FOR ASSESSMENT:   Consult, Ventilator Enteral/tube feeding initiation and management  ASSESSMENT:   Pt with PMH of COPD, HF, substance abuse (cocaine) admitted with SOB requesting intubation. Pt with recent admissions 3/1 and 3/29 for acute hypoxic respiratory failure in setting of pulmonary edema requiring intubation (positive for cocaine).    7/3 TF protocol started: Vital High Protein @ 40 ml/hr with 30 ml Prostat BID: 1160 kcal and 114 grams protein  Labs reviewed: K+ 3.1 (L), Phos and magnesium are WNL   Patient is currently intubated on ventilator support MV: 12.2 L/min Temp (24hrs), Avg:97.7 F (36.5 C), Min:95.7 F (35.4 C), Max:98.7 F (37.1 C) Nutrition-Focused physical exam completed. Findings are severe fat depletion, severe muscle depletion, and no edema.  Weight stable per chart review likely indicating continued malnutrition in setting of substance abuse and chronic diseases.    Diet Order:    NPO  Skin:  Reviewed, no issues  Last BM:  unknown  Height:   Ht Readings from Last 1 Encounters:  11/10/16 6' 0.5" (1.842 m)    Weight:   Wt Readings from Last 1 Encounters:  11/11/16 130 lb 8.2 oz (59.2 kg)    Ideal Body Weight:  80.9 kg  BMI:  Body mass index is 17.46 kg/m.  Estimated Nutritional Needs:   Kcal:  1725  Protein:  90-110 grams  Fluid:  > 1.7 L/day  EDUCATION NEEDS:   No  education needs identified at this time  Kendell BaneHeather Latron Ribas RD, LDN, CNSC [phone] Pager [phone] After Hours Pager

## 2016-11-12 ENCOUNTER — Inpatient Hospital Stay (HOSPITAL_COMMUNITY): Payer: Medicare Other

## 2016-11-12 DIAGNOSIS — J9600 Acute respiratory failure, unspecified whether with hypoxia or hypercapnia: Secondary | ICD-10-CM

## 2016-11-12 DIAGNOSIS — I5023 Acute on chronic systolic (congestive) heart failure: Secondary | ICD-10-CM

## 2016-11-12 LAB — BASIC METABOLIC PANEL
Anion gap: 15 (ref 5–15)
BUN: 23 mg/dL — AB (ref 6–20)
CALCIUM: 9.2 mg/dL (ref 8.9–10.3)
CO2: 24 mmol/L (ref 22–32)
CREATININE: 1.23 mg/dL (ref 0.61–1.24)
Chloride: 104 mmol/L (ref 101–111)
GFR calc Af Amer: >60 mL/min (ref >60)
GFR calc non Af Amer: >60 mL/min (ref >60)
GLUCOSE: 94 mg/dL (ref 65–99)
Potassium: 3.6 mmol/L (ref 3.5–5.1)
Sodium: 143 mmol/L (ref 135–145)

## 2016-11-12 LAB — GLUCOSE, CAPILLARY
GLUCOSE-CAPILLARY: 90 mg/dL (ref 65–99)
GLUCOSE-CAPILLARY: 150 mg/dL — AB (ref 65–99)
Glucose-Capillary: 88 mg/dL (ref 65–99)
Glucose-Capillary: 77 mg/dL (ref 65–99)
Glucose-Capillary: 92 mg/dL (ref 65–99)

## 2016-11-12 LAB — ECHOCARDIOGRAM COMPLETE
AVLVOTPG: 3 mmHg
CHL CUP DOP CALC LVOT VTI: 11.5 cm
FS: 11 % — AB (ref 28–44)
Height: 72.5 in
IV/PV OW: 1.3
LA diam index: 2.38 cm/m2
LA vol A4C: 49.2 ml
LA vol index: 35.1 mL/m2
LASIZE: 40 mm
LAVOL: 59 mL
LDCA: 2.54 cm2
LEFT ATRIUM END SYS DIAM: 40 mm
LV PW d: 10 mm — AB (ref 0.6–1.1)
LVOT SV: 29 mL
LVOT diameter: 18 mm
LVOTPV: 85 cm/s
Weight: 2000.01 oz

## 2016-11-12 LAB — CBC
HCT: 46 % (ref 39.0–52.0)
Hemoglobin: 14.9 g/dL (ref 13.0–17.0)
MCH: 28.4 pg (ref 26.0–34.0)
MCHC: 32.4 g/dL (ref 30.0–36.0)
MCV: 87.8 fL (ref 78.0–100.0)
PLATELETS: 143 10*3/uL — AB (ref 150–400)
RBC: 5.24 MIL/uL (ref 4.22–5.81)
RDW: 15.8 % — ABNORMAL HIGH (ref 11.5–15.5)
WBC: 8.3 10*3/uL (ref 4.0–10.5)

## 2016-11-12 LAB — HEPARIN LEVEL (UNFRACTIONATED): Heparin Unfractionated: 0.72 IU/mL — ABNORMAL HIGH (ref 0.30–0.70)

## 2016-11-12 LAB — APTT: aPTT: 82 seconds — ABNORMAL HIGH (ref 24–36)

## 2016-11-12 LAB — MAGNESIUM: MAGNESIUM: 2.3 mg/dL (ref 1.7–2.4)

## 2016-11-12 LAB — PHOSPHORUS: Phosphorus: 4.1 mg/dL (ref 2.5–4.6)

## 2016-11-12 MED ORDER — FENTANYL CITRATE (PF) 100 MCG/2ML IJ SOLN: 100.0000 ug | INTRAMUSCULAR | Status: DC | PRN

## 2016-11-12 MED ORDER — DEXMEDETOMIDINE HCL IN NACL 400 MCG/100ML IV SOLN
0.4000 ug/kg/h | INTRAVENOUS | Status: DC
  Administered 2016-11-13: 0.7 ug/kg/h via INTRAVENOUS
  Administered 2016-11-13: 1 ug/kg/h via INTRAVENOUS
  Filled 2016-11-12 (×3): qty 100

## 2016-11-12 MED ORDER — DEXTROSE 5 % IV SOLN
0.0000 ug/min | INTRAVENOUS | Status: DC
  Administered 2016-11-13: 2 ug/min via INTRAVENOUS
  Filled 2016-11-12: qty 4

## 2016-11-12 MED ORDER — MIDAZOLAM HCL 2 MG/2ML IJ SOLN: 2.0000 mg | INTRAMUSCULAR | Status: DC | PRN

## 2016-11-12 MED ORDER — FENTANYL 2500MCG IN NS 250ML (10MCG/ML) PREMIX INFUSION
25.0000 ug/h | INTRAVENOUS | Status: DC
  Administered 2016-11-13: 150 ug/h via INTRAVENOUS
  Administered 2016-11-13: 100 ug/h via INTRAVENOUS
  Filled 2016-11-12: qty 250

## 2016-11-12 MED ORDER — MIDAZOLAM HCL 2 MG/2ML IJ SOLN
2.0000 mg | INTRAMUSCULAR | Status: DC | PRN
  Administered 2016-11-13 (×2): 2 mg via INTRAVENOUS
  Filled 2016-11-12: qty 2

## 2016-11-12 MED ORDER — HALOPERIDOL LACTATE 5 MG/ML IJ SOLN
INTRAMUSCULAR | Status: AC
  Administered 2016-11-12: 5 mg via INTRAVENOUS
  Filled 2016-11-12: qty 1

## 2016-11-12 MED ORDER — FENTANYL CITRATE (PF) 100 MCG/2ML IJ SOLN
12.5000 ug | INTRAMUSCULAR | Status: DC | PRN
  Administered 2016-11-12: 25 ug via INTRAVENOUS
  Filled 2016-11-12 (×2): qty 2

## 2016-11-12 MED ORDER — MIDAZOLAM HCL 2 MG/2ML IJ SOLN
2.0000 mg | INTRAMUSCULAR | Status: DC | PRN
  Filled 2016-11-12: qty 2

## 2016-11-12 MED ORDER — HALOPERIDOL LACTATE 5 MG/ML IJ SOLN
5.0000 mg | Freq: Once | INTRAMUSCULAR | Status: AC
  Administered 2016-11-12: 5 mg via INTRAVENOUS

## 2016-11-12 MED ORDER — FENTANYL CITRATE (PF) 100 MCG/2ML IJ SOLN: 50.0000 ug | Freq: Once | INTRAMUSCULAR | Status: AC

## 2016-11-12 MED ORDER — MIDAZOLAM HCL 2 MG/2ML IJ SOLN
2.0000 mg | INTRAMUSCULAR | Status: DC | PRN
  Administered 2016-11-13: 2 mg via INTRAVENOUS
  Filled 2016-11-12 (×2): qty 2

## 2016-11-12 MED ORDER — SODIUM BICARBONATE 8.4 % IV SOLN
INTRAVENOUS | Status: AC
  Administered 2016-11-13: 50 meq
  Filled 2016-11-12: qty 50

## 2016-11-12 MED ORDER — FENTANYL BOLUS VIA INFUSION
50.0000 ug | INTRAVENOUS | Status: DC | PRN
  Administered 2016-11-13 (×2): 50 ug via INTRAVENOUS
  Filled 2016-11-12: qty 50

## 2016-11-12 MED ORDER — FENTANYL CITRATE (PF) 100 MCG/2ML IJ SOLN
25.0000 ug | INTRAMUSCULAR | Status: DC | PRN
  Administered 2016-11-12: 50 ug via INTRAVENOUS
  Filled 2016-11-12 (×2): qty 2

## 2016-11-12 MED ORDER — SODIUM CHLORIDE 0.9 % IV SOLN
0.4000 ug/kg/h | INTRAVENOUS | Status: DC
  Administered 2016-11-12: 0.7 ug/kg/h via INTRAVENOUS
  Administered 2016-11-12: 1.2 ug/kg/h via INTRAVENOUS
  Filled 2016-11-12: qty 2

## 2016-11-12 MED ORDER — MIDAZOLAM HCL 2 MG/2ML IJ SOLN
2.0000 mg | INTRAMUSCULAR | Status: DC | PRN
  Administered 2016-11-12: 2 mg via INTRAVENOUS
  Filled 2016-11-12: qty 2

## 2016-11-12 MED ORDER — SODIUM CHLORIDE 0.9 % IV SOLN
0.0000 ug/kg/h | INTRAVENOUS | Status: DC
  Administered 2016-11-12: 0.4 ug/kg/h via INTRAVENOUS
  Filled 2016-11-12: qty 2

## 2016-11-12 MED ORDER — HALOPERIDOL LACTATE 5 MG/ML IJ SOLN: 2.0000 mg | INTRAMUSCULAR | Status: DC | PRN

## 2016-11-12 NOTE — Progress Notes (Signed)
eLink Physician-Brief Progress Note Patient Name: Martin AbedMelvin L Vazquez DOB: 10/30/1950 MRN: [id number]   Date of Service  11/12/2016  HPI/Events of Note  Agitation - Currently on Precedex IV infusion and Fentanyl IV PRN. QTc interval = 470 milliseconds.   eICU Interventions  Will order: 1. Haldol 5 mg IV now.  2. Haldol 2 mg IV Q 3 hours PRN agitation. 3. Monitor QTc interval Q 6 hours. Notify MD if QTc interval > 500 milliseconds.         [name] 11/12/2016, 9:45 PM

## 2016-11-12 NOTE — Procedures (Signed)
Intubation Procedure Note DEROY NOAH [id number] [birth year]  Procedure: Intubation Indications: self extubation and resp insufficiency with agitation in need of emergent reintubation  Procedure Details Consent: Unable to obtain consent because of emergent medical necessity. Time Out: Verified patient identification, verified procedure, site/side was marked, verified correct patient position, special equipment/implants available, medications/allergies/relevent history reviewed, required imaging and test results available.  Performed  Maximum sterile technique was used including cap, gloves, gown, hand hygiene and mask.  MAC  glidescope used  Evaluation Hemodynamic Status: BP stable throughout; O2 sats: stable throughout Patient's Current Condition: stable Complications: No apparent complications Patient did tolerate procedure well. Chest X-ray ordered to verify placement.  CXR: pending.   Moiz Ryant 11/12/2016

## 2016-11-12 NOTE — Progress Notes (Signed)
PULMONARY / CRITICAL CARE MEDICINE   Name: [name] MRN: [id number] DOB: 1950-08-13    ADMISSION DATE:  11/10/2016 CONSULTATION DATE:  11/10/2016  REFERRING MD:  Dr. Rush Landmark   CHIEF COMPLAINT:  Dyspnea   HISTORY OF PRESENT ILLNESS:   66 year old male with PMH of COPD, systolic HF (EF 25%), and polysubstance abuse (cocaine). Presented to ED on 7/3 with severe shortness breath by EMS. Upon arrival to ED patient requesting to be intubated as he felt he was suffering. Moments later he became minimally response and was immediately intubated. ABG 7.26/59/123. WBC 9.5, Troponin 0.03, BNP 1932.3. PCCM asked to admit. Recent admission on 3/1 and 3/29 with acute hypoxic respiratory failure in setting of pulmonary edema requiring intubation. During both admissions it was documented that he tests positive for cocaine.    SUBJECTIVE:  Sedated on fent and versed. Very agitated on WUA preventing quality weaning attempt this AM.   VITAL SIGNS: BP (!) 88/74   Pulse (!) 111   Temp 98.7 F (37.1 C) (Oral)   Resp 20   Ht 6' 0.5" (1.842 m)   Wt 56.7 kg (125 lb)   SpO2 99%   BMI 16.72 kg/m   HEMODYNAMICS:    VENTILATOR SETTINGS: Vent Mode: PRVC FiO2 (%):  [40 %] 40 % Set Rate:  [20 bmp] 20 bmp Vt Set:  [630 mL] 630 mL PEEP:  [5 cmH20] 5 cmH20 Plateau Pressure:  [14 cmH20-18 cmH20] 15 cmH20  INTAKE / OUTPUT: I/O last 3 completed shifts: In: 2710.9 [I.V.:1283.6; NG/GT:1427.3] Out: 830 [Urine:830]  PHYSICAL EXAMINATION:  General:  Adult frail male in NAD Neuro:  Sedated. RASS -2. On WUA this AM was able to follow commands.  HEENT:  [place]/AT, PERRL, mild JVD noted. No peripheral edema.  Cardiovascular:  RRR, no MRG  Lungs:  Diminished breath sounds. Crackles bases. Good vent synchrony.  Abdomen:  Non-distended, active bowel sounds  Musculoskeletal: no acute deformity or ROM limitation Skin:  Cool extremities, good pulses. Grossly intact  LABS:  BMET  Recent Labs Lab 11/10/16 1326  11/11/16 0212 11/12/16 0941  NA 136 137 143  K 4.1 3.2* 3.6  CL 103 101 104  CO2 21* 25 24  BUN 12 17 23*  CREATININE 1.39* 1.18 1.23  GLUCOSE 178* 139* 94    Electrolytes  Recent Labs Lab 11/10/16 1326  11/11/16 0212 11/11/16 0429 11/11/16 1702 11/12/16 0941  CALCIUM 9.1  --  9.3  --   --  9.2  MG  --   < > 2.2 2.2 2.3 2.3  PHOS  --   < > 2.5 3.1 2.8 4.1  < > = values in this interval not displayed.  CBC  Recent Labs Lab 11/10/16 1326 11/11/16 0212 11/12/16 0941  WBC 9.5 5.9 8.3  HGB 14.2 13.4 14.9  HCT 44.3 41.8 46.0  PLT 147* 178 143*    Coag's  Recent Labs Lab 11/10/16 1514 11/10/16 2154 11/11/16 0212 11/12/16 0941  APTT 30 69* 68* 82*  INR 1.04  --   --   --     Sepsis Markers  Recent Labs Lab 11/10/16 1627 11/10/16 1816  LATICACIDVEN 1.9 1.9    ABG  Recent Labs Lab 11/10/16 1442  PHART 7.374  PCO2ART 45.4  PO2ART 71.0*    Liver Enzymes  Recent Labs Lab 11/10/16 1326  AST 39  33  ALT 23  22  ALKPHOS 109  118  BILITOT 0.9  1.0  ALBUMIN 3.7  3.7  Cardiac Enzymes  Recent Labs Lab 11/10/16 1519 11/10/16 2154 11/11/16 0212  TROPONINI <0.03 0.03* 0.03*    Glucose  Recent Labs Lab 11/11/16 1110 11/11/16 1523 11/11/16 1956 11/11/16 2331 11/12/16 0325 11/12/16 0726  GLUCAP 100* 73 102* 54* 88 77    Imaging No results found.   STUDIES:  CXR 7/3 > Moderate congestive heart failure. Appropriate position of endotracheal tube. Suspect pulmonary artery enlargement, as can be seen with pulmonary arterial hypertension.  CULTURES: Sputum 7/3 >>  ANTIBIOTICS: None.   SIGNIFICANT EVENTS: 7/3 > Presents to ED with Dyspnea   LINES/TUBES: ETT 7/3 >>  DISCUSSION: 66 year old male presents severe dyspnea. Intubated. EF 25%. BNP 1932. Noted to have flash pulmonary edema thought secondary to cocaine use for which he was positive on presentation. Radiographically he improved, however, agitation continued to  limit his ability to clinically improve on vent with SBT.  ASSESSMENT / PLAN:  PULMONARY A: Acute Hypoxic Respiratory Failure in setting of pulmonary edema  COPD without evidence of acute exacerbation P:   Continue full vent support Agitation limiting wean Will try to change sedation to allow better SBT efforts. Trend CXR and ABG Scheduled Duoneb  VAP Bundle   CARDIOVASCULAR A:  Acute on Chronic systolic Heart Failure - EF 25% 07/13/2016  Ischemic Cardiomyopathy  Hypotension H/O A.Fib on home Xarelto, HTN  -BNP 1932.3 P:  Cardiac Monitoring  Maintain MAP >60 Heparin infusion in lieu of home Xarelto  Lasix gtt 4 mg/hr KVO IVF Hold home cardiac medications (lasix, lisinopril, carvedilol, lipitor) Dopamine off may need to re-start to allow for diuresis. Consider norepi as well.   RENAL A:   Acute Renal Failure > improved Remains net positive.  P:   Trend BMP Replace electrolytes as needed  Place foley Would like to try bolus diuresis, however, BP low. Will see if changing sedation gives us some room. If not, may need to add back pressors.   GASTROINTESTINAL A:   GERD P:   Continue TF PPI  HEMATOLOGIC A:   No issues > on chronic anticoagulation  P:  Trend CBC Maintain Hbg >7 Heparin per pharmacy   INFECTIOUS A:   No issues  P:   Trend WBC and Fever Curve   ENDOCRINE A:   Hyperglycemia    P:   Trend Glucose  SSI  NEUROLOGIC A:   Acute Encephalopathy in setting of sedation  UDS positive for cocaine and benzodiazepine  H/O Cocaine Abuse, CVA  P:   RASS goal: 0/-1 Will transition to precedex from fent/versed in hopes to allow weaning At risk withdrawal, will schedule benzo.  Will need substance abuse counseling.   FAMILY  - Updates: no family at bedside   - Inter-disciplinary family meet or Palliative Care meeting due by:  11/17/2016   Joneen RoachPaul Jillianne Gamino, AGACNP-BC [place] [facility] Pager [phone] or [phone]  [phone]  11/12/2016 11:22 AM

## 2016-11-12 NOTE — Progress Notes (Signed)
eLink Physician-Brief Progress Note Patient Name: Martin AbedMelvin L Juhasz DOB: 06/09/1950 MRN: [id number]   Date of Service  11/12/2016  HPI/Events of Note  Agitation - Already on Precedex IV infusion at 1.2 mcg/kg/hour.   eICU Interventions  Will order: 1. Increase Fentanyl to 25-50 mcg IV Q 1 hour PRN agitation.      Intervention Category Minor Interventions: Agitation / anxiety - evaluation and management  Sommer,Steven Dennard Nipugene 11/12/2016, 8:34 PM

## 2016-11-12 NOTE — Progress Notes (Signed)
eLink Physician-Brief Progress Note Patient Name: Martin AbedMelvin L Vazquez DOB: 02/09/1951 MRN: [id number]   Date of Service  11/12/2016  HPI/Events of Note  Agitation - Currently on a Precedex IV infusion at 0.7 mcg/kg/hour.  eICU Interventions  Will increase ceiling on Precedex IV infusion to 1.2 mcg/kg/hour.      Intervention Category Minor Interventions: Agitation / anxiety - evaluation and management  [name] 11/12/2016, 8:14 PM

## 2016-11-12 NOTE — Progress Notes (Signed)
ANTICOAGULATION CONSULT NOTE  Pharmacy Consult for Heparin Indication: atrial fibrillation  No Known Allergies  Patient Measurements: Height: 6' 0.5" (184.2 cm) Weight: 125 lb (56.7 kg) IBW/kg (Calculated) : 78.75  Vital Signs: Temp: 98.7 F (37.1 C) (07/05 0727) Temp Source: Oral (07/05 0727) BP: 88/74 (07/05 1100) Pulse Rate: 111 (07/05 1100)  Labs:  Recent Labs  11/10/16 1326  11/10/16 1514 11/10/16 1519 11/10/16 2154 11/11/16 0212 11/12/16 0941  HGB 14.2  --   --   --   --  13.4 14.9  HCT 44.3  --   --   --   --  41.8 46.0  PLT 147*  --   --   --   --  178 143*  APTT  --   < > 30  --  69* 68* 82*  LABPROT  --   --  13.7  --   --   --   --   INR  --   --  1.04  --   --   --   --   HEPARINUNFRC  --   --  0.34  --   --  0.55 0.72*  CREATININE 1.39*  --   --   --   --  1.18 1.23  TROPONINI  --   --   --  <0.03 0.03* 0.03*  --   < > = values in this interval not displayed.   Assessment: On heparin for atrial fibrillation while Xarelto on hold during critical illness, aPTT is therapeutic, using aPTT to dose for now given Xarelto influence on anti-Xa levels. CBC is stable with no bleeding noted.   Goal of Therapy:  Heparin level 0.3-0.7 units/ml aPTT 66-102 seconds Monitor platelets by anticoagulation protocol: Yes    Plan:  -Cont heparin at 1000 units/hr -aPTT/HL and CBC with AM labs    Agapito GamesAlison Briggitte Boline, PharmD, BCPS Clinical Pharmacist 11/12/2016 11:08 AM

## 2016-11-12 NOTE — Progress Notes (Signed)
eLink Physician-Brief Progress Note Patient Name: Martin AbedMelvin L Heidecker DOB: 05/25/1950 MRN: [id number]   Date of Service  11/12/2016  HPI/Events of Note  Self extubated minutes ago Was initially aggitated with a few min the weird unresposnsive, have PCCm at bedside in minutes, to retubed  eICU Interventions       Intervention Category Major Interventions: Airway management  Nelda BucksFEINSTEIN,Jalicia Roszak J. 11/12/2016, 11:32 PM

## 2016-11-12 NOTE — Progress Notes (Signed)
On/off agitation and self extubated Seemed post ictal during immediate pre-intubation  Plan cxr Add fent gtt to precedex gtt fent and versed prn Check abg Ct head   Dr. Kalman ShanMurali Ladona Vazquez, M.D., [facility] CenterF.C.C.P Pulmonary and Critical Care Medicine Staff Physician [facility] [place] [facility] Pager: [phone], If no answer or between  15:00h - 7:00h: call [phone]  11/12/2016 11:50 PM

## 2016-11-12 NOTE — Progress Notes (Signed)
eLink Physician-Brief Progress Note Patient Name: Martin AbedMelvin L Vazquez DOB: 02/23/1951 MRN: [id number]   Date of Service  11/12/2016  HPI/Events of Note  Patient only breathing 4 times/minute even when someone is in the room with him. I am very reluctant to extubate this patient.   eICU Interventions  Will order: 1. Precedex IV infusion. Titrate to RASS = 0.      Intervention Category Major Interventions: Other:  Sommer,Steven Dennard Nipugene 11/12/2016, 5:25 PM

## 2016-11-12 NOTE — Progress Notes (Signed)
Pt is requiring constant stimulation via interaction to maintain adequate minute volumes while weaning on PS. This is with RRT at bedside. Sedation is off. RR is 4-6 when left alone without stimulation and continues with low minute volumes. This sustained for almost 45 minutes.   MD Sommers called to make aware and ask if he'd like to extubate or continue with verbal interactions to increase chances of extubation. TVO received to place back on Precedex and try to extubate tomorrow. Concern for need for emergent intubation if he fails  Felipa EmoryJarrell, Mia Winthrop Denise

## 2016-11-12 NOTE — Progress Notes (Signed)
eLink Physician-Brief Progress Note Patient Name: Martin AbedMelvin L Vazquez DOB: 04/07/1951 MRN: [id number]   Date of Service  11/12/2016  HPI/Events of Note  Delirium - Already on Precedex IV infusion at 1.2 mcg/kg/hour.  eICU Interventions  Will order: 1. Increase Precedex IV infusion to 1.7 mcg/kg/hour.  2. Versed 2 mg IV Q 1 hour PRN agitation.      Intervention Category Major Interventions: Delirium, psychosis, severe agitation - evaluation and management  Sommer,Steven Eugene 11/12/2016, 10:23 PM

## 2016-11-12 NOTE — Progress Notes (Signed)
[facility] PCCM  Awake, alert, following commands Passing SBT Extubate  Heber CarolinaBrent Luma Clopper, MD [place] [facility] Pager: [phone] Cell: [phone] After 3pm or if no response, call [phone]

## 2016-11-13 ENCOUNTER — Inpatient Hospital Stay (HOSPITAL_COMMUNITY): Payer: Medicare Other

## 2016-11-13 DIAGNOSIS — I5021 Acute systolic (congestive) heart failure: Secondary | ICD-10-CM

## 2016-11-13 DIAGNOSIS — G9340 Encephalopathy, unspecified: Secondary | ICD-10-CM

## 2016-11-13 LAB — BASIC METABOLIC PANEL
ANION GAP: 10 (ref 5–15)
BUN: 27 mg/dL — ABNORMAL HIGH (ref 6–20)
CHLORIDE: 103 mmol/L (ref 101–111)
CO2: 28 mmol/L (ref 22–32)
Calcium: 9 mg/dL (ref 8.9–10.3)
Creatinine, Ser: 1.21 mg/dL (ref 0.61–1.24)
GFR calc Af Amer: >60 mL/min (ref >60)
GFR calc non Af Amer: >60 mL/min (ref >60)
GLUCOSE: 75 mg/dL (ref 65–99)
POTASSIUM: 3.3 mmol/L — AB (ref 3.5–5.1)
Sodium: 141 mmol/L (ref 135–145)

## 2016-11-13 LAB — CBC
HEMATOCRIT: 41.6 % (ref 39.0–52.0)
HEMOGLOBIN: 13.3 g/dL (ref 13.0–17.0)
MCH: 27.8 pg (ref 26.0–34.0)
MCHC: 32 g/dL (ref 30.0–36.0)
MCV: 86.8 fL (ref 78.0–100.0)
Platelets: 169 10*3/uL (ref 150–400)
RBC: 4.79 MIL/uL (ref 4.22–5.81)
RDW: 15.6 % — ABNORMAL HIGH (ref 11.5–15.5)
WBC: 8.5 10*3/uL (ref 4.0–10.5)

## 2016-11-13 LAB — GLUCOSE, CAPILLARY
GLUCOSE-CAPILLARY: 133 mg/dL — AB (ref 65–99)
GLUCOSE-CAPILLARY: 212 mg/dL — AB (ref 65–99)
GLUCOSE-CAPILLARY: 48 mg/dL — AB (ref 65–99)
Glucose-Capillary: 102 mg/dL — ABNORMAL HIGH (ref 65–99)
Glucose-Capillary: 111 mg/dL — ABNORMAL HIGH (ref 65–99)
Glucose-Capillary: 88 mg/dL (ref 65–99)

## 2016-11-13 LAB — APTT: aPTT: 77 seconds — ABNORMAL HIGH (ref 24–36)

## 2016-11-13 LAB — BLOOD GAS, ARTERIAL
ACID-BASE EXCESS: 4.2 mmol/L — AB (ref 0.0–2.0)
BICARBONATE: 27.5 mmol/L (ref 20.0–28.0)
DRAWN BY: 418751
FIO2: 40
LHR: 20 {breaths}/min
MECHVT: 630 mL
O2 SAT: 95.5 %
PATIENT TEMPERATURE: 98.6
PCO2 ART: 36.1 mmHg (ref 32.0–48.0)
PEEP/CPAP: 5 cmH2O
PH ART: 7.494 — AB (ref 7.350–7.450)
PO2 ART: 75.6 mmHg — AB (ref 83.0–108.0)

## 2016-11-13 LAB — HEPARIN LEVEL (UNFRACTIONATED): Heparin Unfractionated: 0.49 IU/mL (ref 0.30–0.70)

## 2016-11-13 LAB — PHOSPHORUS: Phosphorus: 4.7 mg/dL — ABNORMAL HIGH (ref 2.5–4.6)

## 2016-11-13 LAB — MAGNESIUM: MAGNESIUM: 2.3 mg/dL (ref 1.7–2.4)

## 2016-11-13 MED ORDER — DEXTROSE 50 % IV SOLN
INTRAVENOUS | Status: AC
  Administered 2016-11-13: 50 mL
  Filled 2016-11-13: qty 50

## 2016-11-13 MED ORDER — POTASSIUM CHLORIDE 20 MEQ/15ML (10%) PO SOLN
20.0000 meq | ORAL | Status: DC
Start: 2016-11-13 — End: 2016-11-13
  Administered 2016-11-13: 20 meq
  Filled 2016-11-13: qty 15

## 2016-11-13 MED ORDER — POLYETHYLENE GLYCOL 3350 17 G PO PACK
17.0000 g | PACK | Freq: Every day | ORAL | Status: DC | PRN
  Filled 2016-11-13: qty 1

## 2016-11-13 MED ORDER — MIDAZOLAM HCL 2 MG/2ML IJ SOLN
2.0000 mg | Freq: Once | INTRAMUSCULAR | Status: AC
  Administered 2016-11-13: 2 mg via INTRAVENOUS

## 2016-11-13 MED ORDER — CLONAZEPAM 1 MG PO TABS
1.0000 mg | ORAL_TABLET | Freq: Two times a day (BID) | ORAL | Status: DC
  Administered 2016-11-13: 1 mg
  Filled 2016-11-13: qty 1

## 2016-11-13 MED ORDER — POTASSIUM CHLORIDE 10 MEQ/100ML IV SOLN
10.0000 meq | INTRAVENOUS | Status: AC
  Administered 2016-11-13 (×2): 10 meq via INTRAVENOUS
  Filled 2016-11-13 (×2): qty 100

## 2016-11-13 MED ORDER — ETOMIDATE 2 MG/ML IV SOLN
15.0000 mg | Freq: Once | INTRAVENOUS | Status: AC
  Administered 2016-11-13: 15 mg via INTRAVENOUS

## 2016-11-13 MED ORDER — ROCURONIUM BROMIDE 50 MG/5ML IV SOLN
1.0000 mg/kg | Freq: Once | INTRAVENOUS | Status: AC
  Administered 2016-11-13: 50 mg via INTRAVENOUS

## 2016-11-13 MED ORDER — FENTANYL CITRATE (PF) 100 MCG/2ML IJ SOLN
100.0000 ug | Freq: Once | INTRAMUSCULAR | Status: AC
  Administered 2016-11-13: 100 ug via INTRAVENOUS

## 2016-11-13 NOTE — Progress Notes (Signed)
SLP Cancellation Note  Patient Details Name: [name] MRN: [id number] DOB: 01/30/1951   Cancelled treatment:       Reason Eval/Treat Not Completed: Medical issues which prohibited therapy. Pt reintubated. Will cancel orders for now. Reorder when ready.    Jaslene Marsteller, Riley NearingBonnie Caroline 11/13/2016, 7:52 AM

## 2016-11-13 NOTE — Procedures (Signed)
ELECTROENCEPHALOGRAM REPORT  Date of Study: 11/13/2016  Patient's Name: [name] MRN: [id number] Date of Birth: Nov 22, 1950  Referring Provider: Dr. Rory Percyaniel Feinstein  Clinical History: This is a 66 year old man with multiple episodes of unresponsiveness and apnea.  Medications: dexmedetomidine (PRECEDEX) 400 MCG/100ML (4 mcg/mL) infusion  fentaNYL (SUBLIMAZE) bolus via infusion 50 mcg  dextrose 10 % infusion  diphenhydrAMINE (BENADRYL) injection 12.5 mg  feeding supplement (VITAL 1.5 CAL) liquid 1,000 mL  furosemide (LASIX) 250 mg in dextrose 5 % 250 mL (1 mg/mL) infusion  haloperidol lactate (HALDOL) injection 2 mg  insulin aspart (novoLOG) injection 2-6 Units  ipratropium-albuterol (DUONEB) 0.5-2.5 (3) MG/3ML nebulizer solution 3 mL  norepinephrine (LEVOPHED) 4 mg in dextrose 5 % 250 mL (0.016 mg/mL) infusion  pantoprazole (PROTONIX) injection 40 mg  potassium chloride 20 MEQ/15ML (10%) solution 20 mEq   Technical Summary: A multichannel digital EEG recording measured by the international 10-20 system with electrodes applied with paste and impedances below 5000 ohms performed as portable with EKG monitoring in an intubated and sedated patient.  Hyperventilation and photic stimulation were not performed.  The digital EEG was referentially recorded, reformatted, and digitally filtered in a variety of bipolar and referential montages for optimal display.   Description: The patient is intubated and sedated during the recording.  There is no clear posterior dominant rhythm. The background consists of a large amount of diffuse 4-6 Hz theta and 2-3 Hz delta slowing. Normal sleep architecture was not seen. With stimulation and increased alertness, there is an increase in faster frequencies and muscle artifact. There were no focal slowing, epileptiform discharges, or electrographic seizures seen.  EKG lead showed sinus bradycardia at 60 bpm.  Impression: This sedated EEG is abnormal  due to moderate diffuse slowing of the background.  Clinical Correlation of the above findings indicates diffuse cerebral dysfunction that is non-specific in etiology and can be seen with hypoxic/ischemic injury, toxic/metabolic encephalopathies, or medication effect from Precedex and Fentanyl.  The absence of epileptiform discharges does not rule out a clinical diagnosis of epilepsy.  Clinical correlation is advised.   Patrcia DollyKaren Lucill Mauck, M.D.

## 2016-11-13 NOTE — Progress Notes (Signed)
[facility] PCCM  Patient demanding to have endotracheal tube removed Has been lucid for several hours Will extubate now per patient wishes  Heber CarolinaBrent McQuaid, MD [place] [facility] Pager: [phone] Cell: [phone] After 3pm or if no response, call [phone]

## 2016-11-13 NOTE — Progress Notes (Signed)
[facility] LAB REPLACEMENT ONLY  The patient does apply for the [facility] Protocol based on the criteria listed below:   1. Is GFR >/= 40 ml/min? Yes.    Patient's GFR today is >60 2. Is urine output >/= 0.5 ml/kg/hr for the last 6 hours? Yes.   Patient's UOP is 1.02 ml/kg/hr 3. Is BUN < 60 mg/dL? Yes.    Patient's BUN today is 27 4. Abnormal electrolyte(s): 3.3 5. Ordered repletion with: per protocol 6. If a panic level lab has been reported, has the CCM MD in charge been notified? Yes.  .   Physician: Dr.Feintstein  [name], Martin BoosMaria Vazquez 11/13/2016 6:32 AM

## 2016-11-13 NOTE — Progress Notes (Signed)
Patient transported to CT Scan and returned to unit without any complications. 

## 2016-11-13 NOTE — Procedures (Signed)
Extubation Procedure Note  Patient Details:   Name: [name] DOB: 04/12/1951 MRN: [id number]   Airway Documentation:     Evaluation  O2 sats: stable throughout Complications: No apparent complications Patient did tolerate procedure well. Bilateral Breath Sounds: Clear, Diminished   Yes   Pt extubated and placed on 4 L [place] with humdity. No stridor noted.  [name] 11/13/2016, 3:05 PM

## 2016-11-13 NOTE — Progress Notes (Signed)
eLink Physician-Brief Progress Note Patient Name: Martin AbedMelvin L Carrera DOB: 08/24/1950 MRN: [id number]   Date of Service  11/13/2016  HPI/Events of Note  ABg reviewed - rate to 14 Pcxr, ett wnl For CT Consider am eeg, given how he appeared  eICU Interventions       Intervention Category Major Interventions: Hypoxemia - evaluation and management  Nelda BucksFEINSTEIN,DANIEL J. 11/13/2016, 12:43 AM

## 2016-11-13 NOTE — Progress Notes (Signed)
EEG Completed; Results Pending  

## 2016-11-13 NOTE — Consult Note (Signed)
NEURO HOSPITALIST CONSULT NOTE   Requesting physician: Dr. Marchelle Gearing  Reason for Consult: Agitation, periods of non-responsiveness  History obtained from:  Chart  HPI:                                                                                                                                          [name] is an 66 y.o. male who presented to [facility] with complaints of dyspnea on 7/3. He was intubated upon arrival (per patient request) and subsequently noted to have periods of agitation (beginning 7/4) while on the vent, despite sedative medications. He became alert and followed commands on 7/5; however, shortly following he was found nearly unresponsive by nursing staff. He then became agitated, self-extubated and became unresponsive again. Critical care staff felt his appearance immediately prior to re-intubation was suspicious of a post-ictal state.  On my initial visit, [name] was awake, alert and following commands on my visit. He acknowledged feeling comfortable on the vent, although the sitter stated that he occasionally chokes on it.He does have a past history of stroke with residual right arm numbness, but nodded "no" to ever having seizures before.  [name] did not want to be intubated at the time I was present and was subsequently extubated.  Following extubation, [name] described his earlier episode of agitation, during which he pulled out the ET tube, as deliberate - he did not want to be intubated. He stated that the times at which he was unresponsive were not purposeful, but states he was cognizant of them - he "could not get [his] words out"  Pertinent medications: Clonazepam 1mg  BID Precedex PRN for RASS >0 Versed PRN agitation  Pertinent imaging: CT head: No acute abnormality. Small vessel disease. 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 Overnight EEG pending   Past Medical History:  Diagnosis  Date  . Atrial fibrillation (HCC)   . CAD (coronary artery disease)    nonobstructive  . CHF (congestive heart failure) (HCC)   . Chronic systolic heart failure (HCC)   . COPD (chronic obstructive pulmonary disease) (HCC)   . Drug abuse    cocaine  . Heartburn   . Hypertension   . Stroke (HCC)   . Substance abuse     Past Surgical History:  Procedure Laterality Date  . JOINT REPLACEMENT  L knee    Family History  Problem Relation Age of Onset  . Malignant hyperthermia Mother     Social History:  reports that he quit smoking about 2 months ago. His smoking use included Cigarettes. He has never used smokeless tobacco. He reports that he drinks alcohol. He reports that he uses drugs, including Cocaine, about 1 time per week.  No Known Allergies  MEDICATIONS:  Current Meds  Medication Sig  . atorvastatin (LIPITOR) 40 MG tablet Take 1 tablet (40 mg total) by mouth daily at 6 PM.  . carvedilol (COREG) 3.125 MG tablet Take 3.125 mg by mouth 2 (two) times daily.  Marland Kitchen ipratropium-albuterol (DUONEB) 0.5-2.5 (3) MG/3ML SOLN Take 3 mLs by nebulization every 6 (six) hours as needed. (Patient taking differently: Take 3 mLs by nebulization every 6 (six) hours as needed (for shortness of breath or wheezing). )  . lisinopril (PRINIVIL,ZESTRIL) 2.5 MG tablet Take 1 tablet (2.5 mg total) by mouth daily.  . VENTOLIN HFA 108 (90 Base) MCG/ACT inhaler Inhale 1 puff into the lungs every 6 (six) hours as needed for wheezing or shortness of breath.      Review Of Systems:                                                                                                           History obtained from unobtainable from patient due to mental status and intubation Review of Systems  Constitutional: Negative for fever.  HENT: Negative for congestion, hearing loss and sore throat.   Eyes:  Negative for blurred vision and double vision.  Respiratory: Negative for cough, shortness of breath and wheezing.   Cardiovascular: Negative for chest pain, palpitations and orthopnea.  Gastrointestinal: Negative for abdominal pain and nausea.  Genitourinary: Negative.   Musculoskeletal: Negative.   Skin: Negative.   Neurological: Positive for speech change. Negative for dizziness, tingling, tremors, sensory change, focal weakness, seizures, weakness and headaches.  Psychiatric/Behavioral: Negative.    Blood pressure (!) 68/55, pulse (!) 57, temperature (!) 96.6 F (35.9 C), resp. rate 14, height 6' 0.5" (1.842 m), weight 57.8 kg (127 lb 6.8 oz), SpO2 100 %.   Physical Examination:                                                                                                      General: WDWN male. Appears calm and comfortable in bed HEENT:  Normocephalic, no lesions, without obvious abnormality.  Normal external eye and conjunctiva.  Normal external ears. Normal external nose, mucus membranes and septum.  Normal pharynx. Cardiovascular: S1, S2 normal, pulses palpable throughout   Pulmonary: chest clear, no wheezing, rales, normal symmetric air entry, on ventilator Abdomen: soft, non-tender Extremities: no joint deformities, effusion, or inflammation. Musculoskeletal: no joint tenderness, deformity or swelling  Skin: arcus senilis  Neurological Examination:  Mental Status: [name] is alert, thought content appropriate.  Speech is clear without evidence of aphasia or dysarthria. Able to follow 3-step commands without difficulty. Cranial Nerves: II: Visual fields grossly normal, pupils are equal, round, reactive to light III,IV, VI: Ptosis not present, extra-ocular muscle movements intact bilaterally V,VII: Face is symmetric. Facial light touch sensation intact bilaterally VIII:  Hearing grossly intact IX,X: Gag reflex intact XI: SCM and bilateral shoulder shrug strength 5/5 XII: Tongue midline Motor: Right :     Upper extremity   5/5   Left:     Upper extremity   4/5          Lower extremity   5/5     Lower extremity   4/5 Pronator drift not present, but left hand closes involuntarily during this activity. Sensory: Light touch intact throughout BLE. Light touch sensed greater on the right than the left (chronic) Deep Tendon Reflexes: 2+ and symmetric throughout Plantars: Right: downgoing   Left: downgoing Cerebellar: Finger-to-nose test ataxic bilaterally with mild dysmetria on the left Proprioception: Vibratory sense intact Gait: Not assessed   Lab Results: Basic Metabolic Panel:  Recent Labs Lab 11/10/16 1254  11/10/16 1326  11/11/16 0212 11/11/16 0429 11/11/16 1702 11/12/16 0941 11/13/16 0214  NA 138  --  136  --  137  --   --  143 141  K 4.1  --  4.1  --  3.2*  --   --  3.6 3.3*  CL 106  --  103  --  101  --   --  104 103  CO2  --   --  21*  --  25  --   --  24 28  GLUCOSE 137*  --  178*  --  139*  --   --  94 75  BUN 16  --  12  --  17  --   --  23* 27*  CREATININE 1.30*  --  1.39*  --  1.18  --   --  1.23 1.21  CALCIUM  --   < > 9.1  --  9.3  --   --  9.2 9.0  MG  --   --   --   < > 2.2 2.2 2.3 2.3 2.3  PHOS  --   --   --   < > 2.5 3.1 2.8 4.1 4.7*  < > = values in this interval not displayed.  Liver Function Tests:  Recent Labs Lab 11/10/16 1326  AST 39  33  ALT 23  22  ALKPHOS 109  118  BILITOT 0.9  1.0  PROT 6.4*  6.5  ALBUMIN 3.7  3.7   No results for input(s): LIPASE, AMYLASE in the last 168 hours. No results for input(s): AMMONIA in the last 168 hours.  CBC:  Recent Labs Lab 11/10/16 1254 11/10/16 1326 11/11/16 0212 11/12/16 0941 11/13/16 0214  WBC  --  9.5 5.9 8.3 8.5  NEUTROABS  --  6.7  --   --   --   HGB 15.6 14.2 13.4 14.9 13.3  HCT 46.0 44.3 41.8 46.0 41.6  MCV  --  87.7 85.0 87.8 86.8  PLT  --  147*  178 143* 169    Cardiac Enzymes:  Recent Labs Lab 11/10/16 1519 11/10/16 2154 11/11/16 0212  TROPONINI <0.03 0.03* 0.03*    Lipid Panel: No results for input(s): CHOL, TRIG, HDL, CHOLHDL, VLDL, LDLCALC in the last 168 hours.  CBG:  Recent  Labs Lab 11/12/16 1932 11/13/16 0015 11/13/16 0328 11/13/16 0742 11/13/16 1118  GLUCAP 150* 212* 48* 133* 88    Microbiology: Results for orders placed or performed during the hospital encounter of 11/10/16  MRSA PCR Screening     Status: None   Collection Time: 11/10/16  5:07 PM  Result Value Ref Range Status   MRSA by PCR NEGATIVE NEGATIVE Final    Comment:        The GeneXpert MRSA Assay (FDA approved for NASAL specimens only), is one component of a comprehensive MRSA colonization surveillance program. It is not intended to diagnose MRSA infection nor to guide or monitor treatment for MRSA infections.     Coagulation Studies:  Recent Labs  11/10/16 1514  LABPROT 13.7  INR 1.04    Imaging: Ct Head Wo Contrast  Result Date: 11/13/2016 CLINICAL DATA:  Acute encephalopathy EXAM: CT HEAD WITHOUT CONTRAST TECHNIQUE: Contiguous axial images were obtained from the base of the skull through the vertex without intravenous contrast. COMPARISON:  Head CT 10/04/2015 FINDINGS: Brain: No mass lesion, intraparenchymal hemorrhage or extra-axial collection. No evidence of acute cortical infarct. There is an old right parieto-occipital-temporal infarct with associated encephalomalacia. Unchanged ex vacuo dilatation of the right lateral ventricle. The region of encephalomalacia is greater than on the prior study. There is periventricular hypoattenuation compatible with chronic microvascular disease. Vascular: No hyperdense vessel or unexpected calcification. Skull: Normal visualized skull base, calvarium and extracranial soft tissues. Sinuses/Orbits: No sinus fluid levels or advanced mucosal thickening. No mastoid effusion. Normal orbits.  IMPRESSION: 1. No acute intracranial abnormality. 2. Old infarcts of the posterior right MCA territory and right PCA territory with associated encephalomalacia and superimposed chronic microvascular ischemia. Electronically Signed   By: Deatra Robinson M.D.   On: 11/13/2016 01:26   Dg Chest Port 1 View  Result Date: 11/13/2016 CLINICAL DATA:  Endotracheal tube placement. Orogastric tube placement. Initial encounter. EXAM: PORTABLE CHEST 1 VIEW COMPARISON:  Chest radiograph performed 11/11/2016 FINDINGS: The patient's endotracheal tube is seen ending 6-7 cm above the carina. An enteric tube is noted extending below the diaphragm. The lungs are hyperexpanded, with flattening of the hemidiaphragms compatible with COPD. Chronic peribronchial thickening is noted. No pleural effusion or pneumothorax is seen. The cardiomediastinal silhouette is normal in size. No acute osseous abnormalities are identified. IMPRESSION: 1. Endotracheal tube seen ending 6-7 cm above the carina. This could be advanced 3-4 cm. 2. Enteric tube noted extending below the diaphragm. 3. Findings of COPD. Chronic peribronchial thickening noted. No acute focal airspace consolidation seen. Electronically Signed   By: Roanna Raider M.D.   On: 11/13/2016 00:39   Dg Abd Portable 1v  Result Date: 11/13/2016 CLINICAL DATA:  Orogastric tube placement.  Initial encounter. EXAM: PORTABLE ABDOMEN - 1 VIEW COMPARISON:  CT of the abdomen and pelvis performed 03/05/2014 FINDINGS: The patient's enteric tube is noted ending overlying the body of the stomach. The visualized bowel gas pattern is grossly unremarkable. The lung bases are not well characterized due to technique. No free intra-abdominal air is seen, though evaluation for free air is limited on a single supine view. No acute osseous abnormalities are seen. IMPRESSION: Enteric tube noted ending overlying the body of the stomach. Electronically Signed   By: Roanna Raider M.D.   On: 11/13/2016 00:40      Thank you for consulting the [facility] team. Assessment and plan per attending neurologist.   Bruna Potter PA-C [facility]  11/13/2016, 1:05 PM  I have  seen the patient and reviewed the above note.  He is awake, alert, has a mild left hemiparesis which is baseline from his previous stroke.  Assessment and Plan: 66 year old male with multiple episodes of unresponsiveness and apnea. Description could be consistent with multiple etiologies including hyperglycemia, hypoxia, seizure. I would favor ruling out seizure is a possibility with continuous EEG.   1) overnight EEG with video 2) neurology will continue to follow  Ritta SlotMcNeill Kalenna Millett, MD [facility] [phone]  If 7pm- 7am, please page neurology on call as listed in AMION.

## 2016-11-13 NOTE — Progress Notes (Signed)
ANTICOAGULATION CONSULT NOTE  Pharmacy Consult for Heparin Indication: atrial fibrillation  No Known Allergies  Patient Measurements: Height: 6' 0.5" (184.2 cm) Weight: 127 lb 6.8 oz (57.8 kg) IBW/kg (Calculated) : 78.75  Vital Signs: Temp: 97.5 F (36.4 C) (07/06 0746) Temp Source: Oral (07/06 0746) BP: 133/99 (07/06 0810) Pulse Rate: 53 (07/06 0810)  Labs:  Recent Labs  11/10/16 1514 11/10/16 1519 11/10/16 2154 11/11/16 0212 11/12/16 0941 11/13/16 0214  HGB  --   --   --  13.4 14.9 13.3  HCT  --   --   --  41.8 46.0 41.6  PLT  --   --   --  178 143* 169  APTT 30  --  69* 68* 82* 77*  LABPROT 13.7  --   --   --   --   --   INR 1.04  --   --   --   --   --   HEPARINUNFRC 0.34  --   --  0.55 0.72* 0.49  CREATININE  --   --   --  1.18 1.23 1.21  TROPONINI  --  <0.03 0.03* 0.03*  --   --      Assessment: On heparin for atrial fibrillation while Xarelto on hold during critical illness, aPTT and HL are therapeutic. CBC is stable with no bleeding noted.   Goal of Therapy:  Heparin level 0.3-0.7 units/ml aPTT 66-102 seconds Monitor platelets by anticoagulation protocol: Yes    Plan:  -Cont heparin at 1000 units/hr -Daily HL, CBC -Will dc aPTTs given correlating levels   Agapito GamesAlison Dayanis Bergquist, PharmD, BCPS Clinical Pharmacist 11/13/2016 10:17 AM

## 2016-11-13 NOTE — Progress Notes (Signed)
PULMONARY / CRITICAL CARE MEDICINE   Name: [name] MRN: [id number] DOB: 02-Sep-1950    ADMISSION DATE:  11/10/2016 CONSULTATION DATE:  11/10/2016  REFERRING MD:  Dr. Rush Landmark   CHIEF COMPLAINT:  Dyspnea   HISTORY OF PRESENT ILLNESS:   66 year old male with PMH of COPD, systolic HF (EF 25%), and polysubstance abuse (cocaine). Presented to ED on 7/3 with severe shortness breath by EMS. Upon arrival to ED patient requesting to be intubated as he felt he was suffering. Moments later he became minimally response and was immediately intubated. ABG 7.26/59/123. WBC 9.5, Troponin 0.03, BNP 1932.3. PCCM asked to admit. Recent admission on 3/1 and 3/29 with acute hypoxic respiratory failure in setting of pulmonary edema requiring intubation. During both admissions it was documented that he tests positive for cocaine.    SUBJECTIVE:  Self extubated overnight, was agitated then became unresponsive, re-intubated  VITAL SIGNS: BP (!) 133/99   Pulse (!) 53   Temp (!) 97.5 F (36.4 C) (Oral)   Resp 14   Ht 6' 0.5" (1.842 m)   Wt 57.8 kg (127 lb 6.8 oz)   SpO2 100%   BMI 17.04 kg/m   HEMODYNAMICS:    VENTILATOR SETTINGS: Vent Mode: PRVC FiO2 (%):  [40 %] 40 % Set Rate:  [14 bmp-20 bmp] 14 bmp Vt Set:  [630 mL] 630 mL PEEP:  [5 cmH20] 5 cmH20 Pressure Support:  [8 cmH20] 8 cmH20 Plateau Pressure:  [11 cmH20-21 cmH20] 14 cmH20  INTAKE / OUTPUT: I/O last 3 completed shifts: In: 3405.7 [I.V.:2013.7; NG/GT:1392] Out: 2525 [Urine:2525]  PHYSICAL EXAMINATION:   General:  Resting comfortably in bed HENT: NCAT ETT in place PULM: CTA B, normal effort, vent supported breathing CV: RRR, no mgr GI: BS+, soft, nontender MSK: normal bulk and tone Neuro: following commands on vent   LABS:  BMET  Recent Labs Lab 11/11/16 0212 11/12/16 0941 11/13/16 0214  NA 137 143 141  K 3.2* 3.6 3.3*  CL 101 104 103  CO2 25 24 28   BUN 17 23* 27*  CREATININE 1.18 1.23 1.21  GLUCOSE 139* 94  75    Electrolytes  Recent Labs Lab 11/11/16 0212  11/11/16 1702 11/12/16 0941 11/13/16 0214  CALCIUM 9.3  --   --  9.2 9.0  MG 2.2  < > 2.3 2.3 2.3  PHOS 2.5  < > 2.8 4.1 4.7*  < > = values in this interval not displayed.  CBC  Recent Labs Lab 11/11/16 0212 11/12/16 0941 11/13/16 0214  WBC 5.9 8.3 8.5  HGB 13.4 14.9 13.3  HCT 41.8 46.0 41.6  PLT 178 143* 169    Coag's  Recent Labs Lab 11/10/16 1514  11/11/16 0212 11/12/16 0941 11/13/16 0214  APTT 30  < > 68* 82* 77*  INR 1.04  --   --   --   --   < > = values in this interval not displayed.  Sepsis Markers  Recent Labs Lab 11/10/16 1627 11/10/16 1816  LATICACIDVEN 1.9 1.9    ABG  Recent Labs Lab 11/10/16 1442 11/13/16 0005  PHART 7.374 7.494*  PCO2ART 45.4 36.1  PO2ART 71.0* 75.6*    Liver Enzymes  Recent Labs Lab 11/10/16 1326  AST 39  33  ALT 23  22  ALKPHOS 109  118  BILITOT 0.9  1.0  ALBUMIN 3.7  3.7    Cardiac Enzymes  Recent Labs Lab 11/10/16 1519 11/10/16 2154 11/11/16 0212  TROPONINI <0.03 0.03* 0.03*  Glucose  Recent Labs Lab 11/12/16 1138 11/12/16 1559 11/12/16 1932 11/13/16 0015 11/13/16 0328 11/13/16 0742  GLUCAP 92 90 150* 212* 48* 133*    Imaging Ct Head Wo Contrast  Result Date: 11/13/2016 CLINICAL DATA:  Acute encephalopathy EXAM: CT HEAD WITHOUT CONTRAST TECHNIQUE: Contiguous axial images were obtained from the base of the skull through the vertex without intravenous contrast. COMPARISON:  Head CT 10/04/2015 FINDINGS: Brain: No mass lesion, intraparenchymal hemorrhage or extra-axial collection. No evidence of acute cortical infarct. There is an old right parieto-occipital-temporal infarct with associated encephalomalacia. Unchanged ex vacuo dilatation of the right lateral ventricle. The region of encephalomalacia is greater than on the prior study. There is periventricular hypoattenuation compatible with chronic microvascular disease. Vascular:  No hyperdense vessel or unexpected calcification. Skull: Normal visualized skull base, calvarium and extracranial soft tissues. Sinuses/Orbits: No sinus fluid levels or advanced mucosal thickening. No mastoid effusion. Normal orbits. IMPRESSION: 1. No acute intracranial abnormality. 2. Old infarcts of the posterior right MCA territory and right PCA territory with associated encephalomalacia and superimposed chronic microvascular ischemia. Electronically Signed   By: Deatra RobinsonKevin  Herman M.D.   On: 11/13/2016 01:26   Dg Chest Port 1 View  Result Date: 11/13/2016 CLINICAL DATA:  Endotracheal tube placement. Orogastric tube placement. Initial encounter. EXAM: PORTABLE CHEST 1 VIEW COMPARISON:  Chest radiograph performed 11/11/2016 FINDINGS: The patient's endotracheal tube is seen ending 6-7 cm above the carina. An enteric tube is noted extending below the diaphragm. The lungs are hyperexpanded, with flattening of the hemidiaphragms compatible with COPD. Chronic peribronchial thickening is noted. No pleural effusion or pneumothorax is seen. The cardiomediastinal silhouette is normal in size. No acute osseous abnormalities are identified. IMPRESSION: 1. Endotracheal tube seen ending 6-7 cm above the carina. This could be advanced 3-4 cm. 2. Enteric tube noted extending below the diaphragm. 3. Findings of COPD. Chronic peribronchial thickening noted. No acute focal airspace consolidation seen. Electronically Signed   By: Roanna RaiderJeffery  Chang M.D.   On: 11/13/2016 00:39   Dg Abd Portable 1v  Result Date: 11/13/2016 CLINICAL DATA:  Orogastric tube placement.  Initial encounter. EXAM: PORTABLE ABDOMEN - 1 VIEW COMPARISON:  CT of the abdomen and pelvis performed 03/05/2014 FINDINGS: The patient's enteric tube is noted ending overlying the body of the stomach. The visualized bowel gas pattern is grossly unremarkable. The lung bases are not well characterized due to technique. No free intra-abdominal air is seen, though evaluation for  free air is limited on a single supine view. No acute osseous abnormalities are seen. IMPRESSION: Enteric tube noted ending overlying the body of the stomach. Electronically Signed   By: Roanna RaiderJeffery  Chang M.D.   On: 11/13/2016 00:40     STUDIES:  CXR 7/3 > Moderate congestive heart failure. Appropriate position of endotracheal tube. Suspect pulmonary artery enlargement, as can be seen with pulmonary arterial hypertension. 7/6 Head CT > neg for acute, old stroke noted 7/6 EEG> pending  CULTURES: Sputum 7/3 >>  ANTIBIOTICS: None.   SIGNIFICANT EVENTS: 7/3 > Presents to ED with Dyspnea   LINES/TUBES: ETT 7/3 >>  DISCUSSION: 66 year old male presents severe dyspnea. Intubated. EF 25%. BNP 1932. Noted to have flash pulmonary edema thought secondary to cocaine use for which he was positive on presentation. Radiographically he improved, however, agitation continued to limit his ability to clinically improve on vent with SBT.  ASSESSMENT / PLAN:  PULMONARY A: Acute Hypoxic Respiratory Failure in setting of pulmonary edema  COPD without evidence of acute exacerbation  P:   Continue full vent suport Continue VAP prevention Continue lasix infusion  CARDIOVASCULAR A:  Acute on Chronic systolic Heart Failure - EF 25% 07/13/2016  Ischemic Cardiomyopathy  Hypotension > medication related vs propofol H/O A.Fib on home Xarelto, HTN  -BNP 1932.3 P:  Continue lasix gtt Tele Continue levophed for MAP > 55 Continue heparin  RENAL A:   Acute Renal Failure > improved Remains net positive.  P:   Monitor BMET and UOP Replace electrolytes as needed Continue diuresis  GASTROINTESTINAL A:   GERD P:   Continue tube feedings Continue stress ulcer prophylaxis  HEMATOLOGIC A:   No issues > on chronic anticoagulation  P:  Monitor for bleeding Heparin per pharm  INFECTIOUS A:   No issues  P:   Monitor for bleeding  ENDOCRINE A:   Hyperglycemia    Had hypoglycemia overnight>  was this related to his confusion? P:   Trend Glucose  SSI Continue D10 for now Check glucose immediately when if he becomes unresponsive  NEUROLOGIC A:   Acute Encephalopathy in setting of sedation  > intermittent periods of unresponsiveness both on and off vent> seizures? Hypoglycemia? Central sleep apnea UDS positive for cocaine and benzodiazepine  H/O Cocaine Abuse, CVA  P:   RASS goal precedex 0 to -1 Try to get continuous sedation infusions Continue precedex for intermittent severe agitation/combativeness Add clonazepam 1 bid Make fentanyl prn   FAMILY  - Updates: no family at bedside   - Inter-disciplinary family meet or Palliative Care meeting due by:  11/17/2016   My cc time 32 minutes  Heber [place], MD [place] [facility] Pager: [phone] Cell: [phone] After 3pm or if no response, call [phone]   11/13/2016 9:59 AM

## 2016-11-13 NOTE — Progress Notes (Signed)
Pt vomited. HME and ballard changed.

## 2016-11-13 NOTE — Progress Notes (Signed)
Pt remains calm and cooperative after extubation. VS stable. Klonopin was help per Dr. Dellie CatholicSommers. Fentanyl 225 ml was wasted in sink. Witnessed by Lilli FewLauren Robins, RN.

## 2016-11-13 NOTE — Progress Notes (Addendum)
LTM EEG initiated, patient, nursing staff educated.  Event button in place and tested.

## 2016-11-13 NOTE — Progress Notes (Signed)
eLink Physician-Brief Progress Note Patient Name: Martin AbedMelvin L Vazquez DOB: 07/05/1950 MRN: [id number]   Date of Service  11/13/2016  HPI/Events of Note  Blood glucose = 88. Diabetes nurse request D/C of Novolog SSI.  eICU Interventions  Will D/C Novolog SSI.     Intervention Category Major Interventions: Other:  Pradeep Beaubrun Dennard Nipugene 11/13/2016, 5:15 PM

## 2016-11-14 DIAGNOSIS — J810 Acute pulmonary edema: Secondary | ICD-10-CM

## 2016-11-14 LAB — BASIC METABOLIC PANEL
Anion gap: 12 (ref 5–15)
BUN: 18 mg/dL (ref 6–20)
CALCIUM: 9.3 mg/dL (ref 8.9–10.3)
CO2: 28 mmol/L (ref 22–32)
CREATININE: 1.22 mg/dL (ref 0.61–1.24)
Chloride: 98 mmol/L — ABNORMAL LOW (ref 101–111)
GFR calc non Af Amer: >60 mL/min (ref >60)
Glucose, Bld: 95 mg/dL (ref 65–99)
Potassium: 3.4 mmol/L — ABNORMAL LOW (ref 3.5–5.1)
Sodium: 138 mmol/L (ref 135–145)

## 2016-11-14 LAB — GLUCOSE, CAPILLARY
GLUCOSE-CAPILLARY: 92 mg/dL (ref 65–99)
Glucose-Capillary: 143 mg/dL — ABNORMAL HIGH (ref 65–99)
Glucose-Capillary: 142 mg/dL — ABNORMAL HIGH (ref 65–99)

## 2016-11-14 LAB — CBC
HCT: 45.8 % (ref 39.0–52.0)
Hemoglobin: 14.5 g/dL (ref 13.0–17.0)
MCH: 28 pg (ref 26.0–34.0)
MCHC: 31.7 g/dL (ref 30.0–36.0)
MCV: 88.4 fL (ref 78.0–100.0)
PLATELETS: 149 10*3/uL — AB (ref 150–400)
RBC: 5.18 MIL/uL (ref 4.22–5.81)
RDW: 15.5 % (ref 11.5–15.5)
WBC: 9.9 10*3/uL (ref 4.0–10.5)

## 2016-11-14 LAB — PHOSPHORUS: Phosphorus: 3.4 mg/dL (ref 2.5–4.6)

## 2016-11-14 LAB — MAGNESIUM: MAGNESIUM: 2.2 mg/dL (ref 1.7–2.4)

## 2016-11-14 LAB — HEPARIN LEVEL (UNFRACTIONATED): HEPARIN UNFRACTIONATED: 0.44 [IU]/mL (ref 0.30–0.70)

## 2016-11-14 LAB — APTT: aPTT: 68 seconds — ABNORMAL HIGH (ref 24–36)

## 2016-11-14 MED ORDER — PNEUMOCOCCAL VAC POLYVALENT 25 MCG/0.5ML IJ INJ
0.5000 mL | INJECTION | INTRAMUSCULAR | Status: AC
  Administered 2016-11-16: 0.5 mL via INTRAMUSCULAR
  Filled 2016-11-14: qty 0.5

## 2016-11-14 MED ORDER — IPRATROPIUM-ALBUTEROL 0.5-2.5 (3) MG/3ML IN SOLN
3.0000 mL | Freq: Two times a day (BID) | RESPIRATORY_TRACT | Status: DC
  Administered 2016-11-15 – 2016-11-19 (×8): 3 mL via RESPIRATORY_TRACT
  Filled 2016-11-14 (×8): qty 3

## 2016-11-14 MED ORDER — CLONAZEPAM 0.5 MG PO TABS
0.5000 mg | ORAL_TABLET | Freq: Two times a day (BID) | ORAL | Status: DC
  Administered 2016-11-14 – 2016-11-16 (×4): 0.5 mg
  Filled 2016-11-14 (×4): qty 1

## 2016-11-14 MED ORDER — FUROSEMIDE 10 MG/ML IJ SOLN
40.0000 mg | Freq: Four times a day (QID) | INTRAMUSCULAR | Status: AC
  Administered 2016-11-14 (×2): 40 mg via INTRAVENOUS
  Filled 2016-11-14 (×3): qty 4

## 2016-11-14 MED ORDER — POTASSIUM CHLORIDE CRYS ER 20 MEQ PO TBCR
40.0000 meq | EXTENDED_RELEASE_TABLET | Freq: Once | ORAL | Status: AC
  Administered 2016-11-14: 40 meq via ORAL
  Filled 2016-11-14: qty 2

## 2016-11-14 MED ORDER — RIVAROXABAN 20 MG PO TABS
20.0000 mg | ORAL_TABLET | Freq: Every day | ORAL | Status: DC
  Administered 2016-11-14: 20 mg via ORAL
  Filled 2016-11-14: qty 1

## 2016-11-14 NOTE — Progress Notes (Signed)
Report was given to 3E29.  Pt calm and cooperative. VS stable. Pt's belongings with the patient.

## 2016-11-14 NOTE — Progress Notes (Signed)
No events overnight.   He is awake, alert, oriented. No further spells.   A this time, I think that the spells are of unclear etiology, low suspicion for seizures.   Will d/c LTM EEG at this time. Will follow up read.   Please call if he has any further events. Neurology will sign off.   Ritta SlotMcNeill Charlyne Robertshaw, MD [facility] [phone]  If 7pm- 7am, please page neurology on call as listed in AMION.

## 2016-11-14 NOTE — Procedures (Signed)
  Video EEG Monitoring Report    Dates of monitoring:   11/13/16 @14 :52 to 11/14/16 @07 :30  Recording day:    1 (started on 7/6) EEG Number:    [id number] Requesting provider:   Rejeana BrockKirkpatrick, McNeill P, MD Interpreting physician:  Wynelle Bourgeoisan-Andrei Shonita Rinck, MD  CPT:  [phone] ICD-10:  R56.9   Present History: 66 year old man with episodes of unresponsiveness and apnea. Continuous VEEG requested to evaluate for seizures.    EEG Classification Normal, Awake and sleep and oriented  PDR  9 Hz, reactive and symmetric  HR  70 bpm and regular     EEG DETAILS:  TYPE OF RECORDING: At least 18-channel digital EEG with using a standard international 10-20 placement with additional EEG electrodes, and 1 additional channel dedicated to the EKG, at a sampling rate of at least 256 Hz. Video was recorded throughout the study. The recording was interpreted using digital review software allowing for montage reformatting, gain and filter changes. Each page was reviewed manually. Automatic spike and seizure detection software and quantitative analysis tools were used as needed.   Description of EEG features: During wakefulness normal voltage, mixed frequency rhythms are seen with a dominant alpha-theta background and superimposed beta activity which has normal amplitude. A posterior dominant rhythm of 9 Hz is observed, which is symmetric and attenuates normally with eye opening.   Drowsiness is accompanied by the usual EEG correlates including roving and then absent eye movements, progressively fewer eye blinks, waxing and waning PDR and disappearance of the PDR, generalized slow activity, and central theta activity.  Normal stage II and slow wave sleep are recorded. Stage II is characterized by normal appearing, symmetric vertex waves, spindles and K-complexes. Slow wave sleep is distinguishable by high amplitude, diffuse, symmetric delta dominant background with mixed overriding faster activities.   Push Button  Events: none  Interpretation: This is a normal continuous EEG. No epileptiform discharges, focal or lateralizing signs are seen. There is no unequivocal evidence of encephalopathy.

## 2016-11-14 NOTE — Progress Notes (Signed)
PULMONARY / CRITICAL CARE MEDICINE   Name: [name] MRN: [id number] DOB: 1951/03/16    ADMISSION DATE:  11/10/2016 CONSULTATION DATE:  11/10/2016  REFERRING MD:  Dr. Rush Landmark   CHIEF COMPLAINT:  Dyspnea   HISTORY OF PRESENT ILLNESS:   66 year old male with PMH of COPD, systolic HF (EF 25%), and polysubstance abuse (cocaine). Presented to ED on 7/3 with severe shortness breath by EMS. Upon arrival to ED patient requesting to be intubated as he felt he was suffering. Moments later he became minimally response and was immediately intubated. ABG 7.26/59/123. WBC 9.5, Troponin 0.03, BNP 1932.3. PCCM asked to admit. Recent admission on 3/1 and 3/29 with acute hypoxic respiratory failure in setting of pulmonary edema requiring intubation. During both admissions it was documented that he tests positive for cocaine.    SUBJECTIVE:  Extubated yesterday Wants to eat No neuro events  VITAL SIGNS: BP 94/65   Pulse 86   Temp 99.6 F (37.6 C) (Oral)   Resp 19   Ht 6' 0.5" (1.842 m)   Wt 57.8 kg (127 lb 6.8 oz)   SpO2 94%   BMI 17.04 kg/m   HEMODYNAMICS:    VENTILATOR SETTINGS: Vent Mode: PRVC FiO2 (%):  [40 %] 40 % Set Rate:  [16 bmp] 16 bmp Vt Set:  [630 mL] 630 mL PEEP:  [5 cmH20] 5 cmH20 Plateau Pressure:  [14 cmH20] 14 cmH20  INTAKE / OUTPUT: I/O last 3 completed shifts: In: 2564.2 [I.V.:1732.2; NG/GT:632; IV Piggyback:200] Out: 3325 [Urine:2825; Stool:500]  PHYSICAL EXAMINATION:   General:  Resting comfortably in bed HENT: NCAT OP clear PULM: CTA B, normal effort CV: RRR, no mgr GI: BS+, soft, nontender MSK: normal bulk and tone Neuro: awake, alert, no distress, MAEW    LABS:  BMET  Recent Labs Lab 11/12/16 0941 11/13/16 0214 11/14/16 0605  NA 143 141 138  K 3.6 3.3* 3.4*  CL 104 103 98*  CO2 24 28 28   BUN 23* 27* 18  CREATININE 1.23 1.21 1.22  GLUCOSE 94 75 95    Electrolytes  Recent Labs Lab 11/12/16 0941 11/13/16 0214 11/14/16 0605   CALCIUM 9.2 9.0 9.3  MG 2.3 2.3 2.2  PHOS 4.1 4.7* 3.4    CBC  Recent Labs Lab 11/12/16 0941 11/13/16 0214 11/14/16 0605  WBC 8.3 8.5 9.9  HGB 14.9 13.3 14.5  HCT 46.0 41.6 45.8  PLT 143* 169 149*    Coag's  Recent Labs Lab 11/10/16 1514  11/12/16 0941 11/13/16 0214 11/14/16 0605  APTT 30  < > 82* 77* 68*  INR 1.04  --   --   --   --   < > = values in this interval not displayed.  Sepsis Markers  Recent Labs Lab 11/10/16 1627 11/10/16 1816  LATICACIDVEN 1.9 1.9    ABG  Recent Labs Lab 11/10/16 1442 11/13/16 0005  PHART 7.374 7.494*  PCO2ART 45.4 36.1  PO2ART 71.0* 75.6*    Liver Enzymes  Recent Labs Lab 11/10/16 1326  AST 39  33  ALT 23  22  ALKPHOS 109  118  BILITOT 0.9  1.0  ALBUMIN 3.7  3.7    Cardiac Enzymes  Recent Labs Lab 11/10/16 1519 11/10/16 2154 11/11/16 0212  TROPONINI <0.03 0.03* 0.03*    Glucose  Recent Labs Lab 11/13/16 0742 11/13/16 1118 11/13/16 1929 11/13/16 2330 11/14/16 0333 11/14/16 0808  GLUCAP 133* 88 111* 102* 92 143*    Imaging No results found.  STUDIES:  CXR 7/3 > Moderate congestive heart failure. Appropriate position of endotracheal tube. Suspect pulmonary artery enlargement, as can be seen with pulmonary arterial hypertension. 7/6 Head CT > neg for acute, old stroke noted 7/6 EEG> negative  CULTURES: Sputum 7/3 >>  ANTIBIOTICS: None.   SIGNIFICANT EVENTS: 7/3 > Presents to ED with Dyspnea  7/6 > self extubated, re-intubated, then extubated  LINES/TUBES: ETT 7/3 >> 7.6  DISCUSSION: 66 year old male presents severe dyspnea. Intubated. EF 25%. BNP 1932. Noted to have flash pulmonary edema thought secondary to cocaine use for which he was positive on presentation. Radiographically he improved, however, agitation continued to limit his ability to clinically improve on vent with SBT.  As of 7/7 his mental status has improved.  ASSESSMENT / PLAN:  PULMONARY A: Acute Hypoxic  Respiratory Failure in setting of pulmonary edema > improving COPD without evidence of acute exacerbation P:   Extubate today Prn albuterol Monitor O2 saturation  CARDIOVASCULAR A:  Acute on Chronic systolic Heart Failure - EF 25% 07/13/2016 > improving Ischemic Cardiomyopathy  H/O A.Fib on home Xarelto, HTN  P:  Tele Stop lasix infusion Start xarelto, hold heparin Start back coreg/ACE in the next few days as BP allows  RENAL A:   Acute Renal Failure > improved  P:   Monitor BMET and UOP Replace electrolytes as needed Change diuresis to bid  GASTROINTESTINAL A:   GERD P:   Advance diet: heart healthy  HEMATOLOGIC A:   No issues > on chronic anticoagulation  P:  Stop heparin Start xarelto  INFECTIOUS A:   No issues  P:   Monitor for fever  ENDOCRINE A:   Hyperglycemia    > resolved  P:   Monitor glucose on BMET daily  NEUROLOGIC A:   Acute Encephalopathy in setting of sedation  > resolved, sedation related? EEG negative UDS positive for cocaine and benzodiazepine  H/O Cocaine Abuse, CVA  P:   Decrease clonazepam to 0.5 bid Appreciate neuro recs   FAMILY  - Updates: no family at bedside   - Inter-disciplinary family meet or Palliative Care meeting due by:  11/17/2016   Move out to [place], [facility] service  Heber CarolinaBrent McQuaid, MD [place] [facility] Pager: [phone] Cell: [phone] After 3pm or if no response, call [phone]   11/14/2016 10:15 AM

## 2016-11-14 NOTE — Progress Notes (Signed)
ANTICOAGULATION CONSULT NOTE - Initial Consult  Pharmacy Consult for Xarelto Indication: atrial fibrillation  No Known Allergies  Patient Measurements: Height: 6' 0.5" (184.2 cm) Weight: 127 lb 6.8 oz (57.8 kg) IBW/kg (Calculated) : 78.75   Vital Signs: Temp: 99.6 F (37.6 C) (07/07 0806) Temp Source: Oral (07/07 0806) BP: 94/65 (07/07 1000) Pulse Rate: 86 (07/07 1000)  Labs:  Recent Labs  11/12/16 0941 11/13/16 0214 11/14/16 0605  HGB 14.9 13.3 14.5  HCT 46.0 41.6 45.8  PLT 143* 169 149*  APTT 82* 77* 68*  HEPARINUNFRC 0.72* 0.49 0.44  CREATININE 1.23 1.21 1.22    Estimated Creatinine Clearance: 50.7 mL/min (by C-G formula based on SCr of 1.22 mg/dL).   Medical History: Past Medical History:  Diagnosis Date  . Atrial fibrillation (HCC)   . CAD (coronary artery disease)    nonobstructive  . CHF (congestive heart failure) (HCC)   . Chronic systolic heart failure (HCC)   . COPD (chronic obstructive pulmonary disease) (HCC)   . Drug abuse    cocaine  . Heartburn   . Hypertension   . Stroke (HCC)   . Substance abuse      Assessment: 66 yo male on Xarelto prior to admission for history of AFib. He was transitioned to heparin upon intubation and while he was critically ill. Morning labs show he was therapeutic on heparin today. Now planning to transition back to Xarelto. hgb 14.5, plts 149  Goal of Therapy:  Monitor platelets by anticoagulation protocol: Yes    Plan:  -Stop heparin infusion at the same time Xarelto is given -Xarelto 20 mg po daily   Dhanush Jokerst, Darl Householderlison M 11/14/2016,10:26 AM

## 2016-11-14 NOTE — Progress Notes (Signed)
Patient transferred to room 3E29 via wheelchair from ICU.  Patient oriented to room and call bell within reach.

## 2016-11-15 DIAGNOSIS — G9341 Metabolic encephalopathy: Secondary | ICD-10-CM

## 2016-11-15 DIAGNOSIS — R739 Hyperglycemia, unspecified: Secondary | ICD-10-CM

## 2016-11-15 DIAGNOSIS — I159 Secondary hypertension, unspecified: Secondary | ICD-10-CM

## 2016-11-15 DIAGNOSIS — N179 Acute kidney failure, unspecified: Secondary | ICD-10-CM

## 2016-11-15 DIAGNOSIS — I480 Paroxysmal atrial fibrillation: Secondary | ICD-10-CM

## 2016-11-15 DIAGNOSIS — E871 Hypo-osmolality and hyponatremia: Secondary | ICD-10-CM

## 2016-11-15 DIAGNOSIS — F1910 Other psychoactive substance abuse, uncomplicated: Secondary | ICD-10-CM

## 2016-11-15 DIAGNOSIS — I10 Essential (primary) hypertension: Secondary | ICD-10-CM

## 2016-11-15 DIAGNOSIS — E876 Hypokalemia: Secondary | ICD-10-CM

## 2016-11-15 LAB — CBC
HCT: 41.6 % (ref 39.0–52.0)
Hemoglobin: 13.4 g/dL (ref 13.0–17.0)
MCH: 28 pg (ref 26.0–34.0)
MCHC: 32.2 g/dL (ref 30.0–36.0)
MCV: 87 fL (ref 78.0–100.0)
PLATELETS: 167 10*3/uL (ref 150–400)
RBC: 4.78 MIL/uL (ref 4.22–5.81)
RDW: 15.1 % (ref 11.5–15.5)
WBC: 9.4 10*3/uL (ref 4.0–10.5)

## 2016-11-15 LAB — BASIC METABOLIC PANEL
Anion gap: 11 (ref 5–15)
BUN: 25 mg/dL — AB (ref 6–20)
CHLORIDE: 98 mmol/L — AB (ref 101–111)
CO2: 25 mmol/L (ref 22–32)
CREATININE: 1.36 mg/dL — AB (ref 0.61–1.24)
Calcium: 9.1 mg/dL (ref 8.9–10.3)
GFR calc Af Amer: >60 mL/min (ref >60)
GFR calc non Af Amer: 54 mL/min — ABNORMAL LOW (ref >60)
GLUCOSE: 129 mg/dL — AB (ref 65–99)
Potassium: 3.3 mmol/L — ABNORMAL LOW (ref 3.5–5.1)
Sodium: 134 mmol/L — ABNORMAL LOW (ref 135–145)

## 2016-11-15 LAB — PHOSPHORUS: Phosphorus: 3.5 mg/dL (ref 2.5–4.6)

## 2016-11-15 LAB — MAGNESIUM: MAGNESIUM: 2.4 mg/dL (ref 1.7–2.4)

## 2016-11-15 MED ORDER — POTASSIUM CHLORIDE CRYS ER 20 MEQ PO TBCR
40.0000 meq | EXTENDED_RELEASE_TABLET | Freq: Two times a day (BID) | ORAL | Status: DC
  Administered 2016-11-15 – 2016-11-16 (×4): 40 meq via ORAL
  Filled 2016-11-15 (×4): qty 2

## 2016-11-15 MED ORDER — RIVAROXABAN 15 MG PO TABS
15.0000 mg | ORAL_TABLET | Freq: Every day | ORAL | Status: DC
  Administered 2016-11-15 – 2016-11-19 (×5): 15 mg via ORAL
  Filled 2016-11-15 (×5): qty 1

## 2016-11-15 NOTE — Progress Notes (Signed)
ANTICOAGULATION CONSULT NOTE - Follow Up Consult  Pharmacy Consult for Xarelto (rivaroxaban) Indication: atrial fibrillation  No Known Allergies  Patient Measurements: Height: 6\' 1"  (185.4 cm) Weight: 120 lb 14.4 oz (54.8 kg) IBW/kg (Calculated) : 79.9  Vital Signs: Temp: 98.4 F (36.9 C) (07/08 0521) Temp Source: Oral (07/08 0521) BP: 104/73 (07/08 0521) Pulse Rate: 81 (07/08 0521)  Labs:  Recent Labs  11/12/16 0941 11/13/16 0214 11/14/16 0605 11/15/16 0209  HGB 14.9 13.3 14.5 13.4  HCT 46.0 41.6 45.8 41.6  PLT 143* 169 149* 167  APTT 82* 77* 68*  --   HEPARINUNFRC 0.72* 0.49 0.44  --   CREATININE 1.23 1.21 1.22 1.36*    Estimated Creatinine Clearance: 43.1 mL/min (A) (by C-G formula based on SCr of 1.36 mg/dL (H)).   Assessment: [age] yoM w/ Afib, treated on Xarelto 20mg  at home. Changed from heparin back to Xarelto in hospital on 7/7. Baseline renal function from May was Scr 1 (CrCl 58.6), renal function declined overnight, Scr 1.22->1.36 (CrCl 43.1), warranting a dose adjustment of Xarelto. Last dose given 7/7 at 1230.  Hgb 13.4 PLTs 167  Goal of Therapy:  Monitor platelets by anticoagulation protocol: Yes   Plan:  Switch Xarelto 20mg  to 15mg  PO once daily with meals today.  Monitor renal function; if renal function improves, and Scr gets lower than 1.17 (CrCl 50), switch back to Xarelto 20 mg  Donnella Biyler Cia Garretson, PharmD PGY1 Acute Care Pharmacy Resident Pager: [phone] 11/15/2016,8:22 AM

## 2016-11-15 NOTE — Evaluation (Signed)
Physical Therapy Evaluation Patient Details Name: Martin AbedMelvin L Vazquez MRN: [id number] DOB: 10/11/1950 Today's Date: 11/15/2016   History of Present Illness  Pt is a 66 yo male admitted through the ED on 11/10/16 with severe dyspnea and became unresponsive in ED requiring intubation on 11/10/16. Pt self exctubated himself on 11/12/16 and was reintubated with increased confusion noted. Pt was finally extubated on 11/13/16 per his request when he was more lucid and aware. Pt was transferred from ICU to 3E on 11/14/16. PMH significant for A-fib, CHF, chornic systolic CHF, COPD, Drug abuse (cocaine), heartburn, HTN and stroke.   Clinical Impression  Pt presents with the above diagnosis and below deficits for therapy evaluation. Prior to admission, pt reports that he was completely independent and doing for himself without any assistance using public transportation. Pt reports no family or friend support consistently once he returns home. Pt requires Min to Mod A for all mobility this session and is very unsteady on his feet this session with him refusing to use a RW. Pt is in agreement and will benefit from SNF at discharge in order to improve his mobility and strength prior to returning home independently. Pt continues to benefit from acute PT follow-up to address the below deficits prior to discharge.     Follow Up Recommendations SNF;Supervision for mobility/OOB    Equipment Recommendations  3in1 (PT);Rolling walker with 5" wheels    Recommendations for Other Services OT consult;Other (comment) (already in place)     Precautions / Restrictions Precautions Precautions: Fall Restrictions Weight Bearing Restrictions: No      Mobility  Bed Mobility Overal bed mobility: Needs Assistance Bed Mobility: Supine to Sit     Supine to sit: Min guard     General bed mobility comments: Min guard for safety to achieve sitting at EOB  Transfers Overall transfer level: Needs assistance Equipment used: Straight  cane (pt refused RW use this session) Transfers: Sit to/from Stand Sit to Stand: Min assist         General transfer comment: Min A from EOB with A to stabilize once in standing.   Ambulation/Gait Ambulation/Gait assistance: Min assist;Mod assist Ambulation Distance (Feet): 35 Feet Assistive device: Straight cane Gait Pattern/deviations: Step-to pattern;Decreased step length - right;Decreased step length - left;Drifts right/left Gait velocity: decreased Gait velocity interpretation: Below normal speed for age/gender General Gait Details: Slow cadence, unsteady with right lateral trunk lean. Pt becomes unsteady multiple times requiring Mod A to stabilize.   Stairs            Wheelchair Mobility    Modified Rankin (Stroke Patients Only)       Balance Overall balance assessment: Needs assistance Sitting-balance support: No upper extremity supported;Feet supported Sitting balance-Leahy Scale: Fair   Postural control: Right lateral lean Standing balance support: Bilateral upper extremity supported;During functional activity Standing balance-Leahy Scale: Poor Standing balance comment: Reliant on UE support and external assistance to maintain upright posture in standing                             Pertinent Vitals/Pain Pain Assessment: No/denies pain    Home Living Family/patient expects to be discharged to:: Private residence Living Arrangements: Alone Available Help at Discharge: Family;Available PRN/intermittently Type of Home: Apartment Home Access: Stairs to enter Entrance Stairs-Rails: None Entrance Stairs-Number of Steps: 1-2 Home Layout: One level Home Equipment: Cane - single point      Prior Function Level of Independence: Independent  with assistive device(s)         Comments: pt reported that he ambulates with use of SPC when out in his community. Does not use AD within his home. Independent with ADLs.     Hand Dominance   Dominant  Hand: Right    Extremity/Trunk Assessment   Upper Extremity Assessment Upper Extremity Assessment: Defer to OT evaluation    Lower Extremity Assessment Lower Extremity Assessment: Generalized weakness    Cervical / Trunk Assessment Cervical / Trunk Assessment: Normal  Communication   Communication: No difficulties  Cognition Arousal/Alertness: Awake/alert Behavior During Therapy: WFL for tasks assessed/performed Overall Cognitive Status: Within Functional Limits for tasks assessed                                        General Comments General comments (skin integrity, edema, etc.): Pt reports he has no reliable family or friends who can assist at discharge.     Exercises     Assessment/Plan    PT Assessment Patient needs continued PT services  PT Problem List Decreased strength;Decreased activity tolerance;Decreased balance;Decreased mobility;Decreased knowledge of use of DME;Decreased safety awareness;Cardiopulmonary status limiting activity       PT Treatment Interventions DME instruction;Gait training;Functional mobility training;Therapeutic activities;Therapeutic exercise;Balance training;Patient/family education    PT Goals (Current goals can be found in the Care Plan section)  Acute Rehab PT Goals Patient Stated Goal: to get stronger so he can go home by himself PT Goal Formulation: With patient Time For Goal Achievement: 11/29/16 Potential to Achieve Goals: Good    Frequency Min 2X/week   Barriers to discharge Decreased caregiver support no family or friends available to assist consistenly if he returns home    Co-evaluation               AM-PAC PT "6 Clicks" Daily Activity  Outcome Measure Difficulty turning over in bed (including adjusting bedclothes, sheets and blankets)?: None Difficulty moving from lying on back to sitting on the side of the bed? : Total Difficulty sitting down on and standing up from a chair with arms (e.g.,  wheelchair, bedside commode, etc,.)?: Total Help needed moving to and from a bed to chair (including a wheelchair)?: A Lot Help needed walking in hospital room?: A Lot Help needed climbing 3-5 steps with a railing? : Total 6 Click Score: 11    End of Session Equipment Utilized During Treatment: Gait belt Activity Tolerance: Patient limited by fatigue Patient left: in chair;with call bell/phone within reach;with chair alarm set Nurse Communication: Mobility status PT Visit Diagnosis: Unsteadiness on feet (R26.81);Muscle weakness (generalized) (M62.81);Difficulty in walking, not elsewhere classified (R26.2)    Time: 1610-9604 PT Time Calculation (min) (ACUTE ONLY): 23 min   Charges:   PT Evaluation $PT Eval Moderate Complexity: 1 Procedure PT Treatments $Therapeutic Activity: 8-22 mins   PT G Codes:        Colin Broach PT, DPT  [phone]   Ruel Favors Aletha Halim 11/15/2016, 3:58 PM

## 2016-11-15 NOTE — Progress Notes (Signed)
PROGRESS NOTE    [name]  BJY:[id number] DOB: 07/15/1950 DOA: 11/10/2016 PCP: [facility]  Brief Narrative:  66 year old male with PMH of COPD, systolic HF (EF 25%), and polysubstance abuse (cocaine). Presented to ED on 7/3 with severe shortness breath by EMS. Upon arrival to ED patient requesting to be intubated as he felt he was suffering. Moments later he became minimally response and was immediately intubated. ABG 7.26/59/123. WBC 9.5, Troponin 0.03, BNP 1932.3. PCCM asked to admit. Recent admission on 3/1 and 3/29 with acute hypoxic respiratory failure in setting of pulmonary edema requiring intubation. During both admissions it was documented that he tests positive for cocaine. Patient was extubated 11/13/16 [facility]. He is feeling improved and had no complaints currently.   Assessment & Plan:   Active Problems:   Polysubstance abuse   Essential hypertension   Acute respiratory failure with hypoxia (HCC)   Chronic systolic congestive heart failure (HCC)   Flash pulmonary edema (HCC)   Hypokalemia   Acute on chronic systolic heart failure (HCC)   Atrial fibrillation (HCC)   Substance abuse   Hypertension   Acute on chronic heart failure (HCC)   Ventilator dependent (HCC)   Acute encephalopathy   AKI (acute kidney injury) (HCC)   Hyperglycemia   Acute metabolic encephalopathy   Hyponatremia  Acute Respiratory Failure with Hypoxia in the Setting of Pulmonary Edema from Systolic CHF exacerbation in the setting of Cocaine abuse, improved -Patient is s/p Extubation -C/w DuoNeb 3 mL BID -Continuous Pulse Oximetry and Maintain O2 Saturations >92% -C/w Supplemental O2 as Needed and Wean as tolerated -Patient is on Room Air Now  Acute Encephalopathy in the Setting of Sedation, improved  -Resolved and ? Sedation relation -Neurology Consulted for further Reccs and feel he has low suspicion for seizures -EEG on 11/13/16  was showe sedated EEG is abnormal due to moderate diffuse slowing of the background. Clinical Correlation of the above findings indicates diffuse cerebral dysfunction that is non-specific in etiology and can be seen with hypoxic/ischemic injury, toxic/metabolic encephalopathies, or medication effect from Precedex and Fentanyl.  The absence of epileptiform discharges does not rule out a clinical diagnosis of epilepsy.   -Continuous VEEG was interpreted as a normal continuous EEG. No epileptiform discharges, focal or lateralizing signs are seen. There is no unequivocal evidence of encephalopathy -Neurology Signed off and appreciated their assistance  -C/w Clonazepam 0.5 mg po BID  Hx of Atrial Fibrillation -C/w Telemetry -Heparin gtt stopped and Xarelto started with Pharmacy to dose -Will likely need to restart Carvedilol 3.125 mg po BID in AM -C/w Xarelto 20 mg po Daily  Acute Decompensation of Chronic Systolic CHF with an EF of 25% in association with Ischemic Cardiomyopathy -C/w Telemetry -Strict I's/O's, Daily Weights, SLIV -BNP was 1932.3 -IV 40 mg Lasix q6h D/C'd; Will likely restart po Lasix in AM -Patient is -1.083 Liters; Patient has lost 20 lbs -Will likely need to restart Carvedilol 3.125 mg po BID in AM and Lisinopril 2.5 mg po Daily eventually and may start tomorrow but holding today given recent AKI and Cocaine Abuse  -Continue to Monitor Volume Status   Hypokalemia -Likely from Diuresis  -Patient's K+ this AM was 3.2 -Replete with po KCl 40 mEQ po BID x2 -Continue to Monitor and Replete as Necessary -Repeat CMP in AM  COPD  -Currently not in exacerbation -As above  Hyperglycemia -CBG's ranging from 92-1143 -If Necessary Add Sensitive Novolog  SSI  AKI -Likely from Aggressive Diuresis   -Patient's BUN/Cr went from 17/1.18 -> 27/1.21 -> 18/1.22 -> 25/1.36 -Hold off on Diuresis today -Possibly add Home Lasix of 40 mg po Daily and Lisinopril 2.5 mg po daily -Continue  to Monitor and Repeat CMP in AM  Polysubstance/Cocaine Abuse -Counseling Given -Avoid BB for Now given Recent Cocaine Abuse -UDS + for Cocaine as well as + Benzodiazepines   Essential Hypertension -BP was on the softer side and controlled without medication  -Patient was given IV Diuresis -Holding BP meds of Lasix of 40 mg po Daily, Carvedilol 3.125 mg po BID, and Lisinopril 2.5 mg po daily for now  Hyponatremia -Mild at 134 -Continue to Monitor and Repeat CMP in AM  DVT prophylaxis: Anticoagulated with Xarelto Code Status: FULL CODE Family Communication: No Family present at bedside Disposition Plan: Will get PT Evaluation; Likely Home at D/C  Consultants:   PCCM Transfer  Neurology  Procedures:   Intubated 7/3 and Self Extubated 7/6, Reintubated and then Extubated.   EEG on 11/13/16  Impression: This sedated EEG is abnormal due to moderate diffuse slowing of the background.  Clinical Correlation of the above findings indicates diffuse cerebral dysfunction that is non-specific in etiology and can be seen with hypoxic/ischemic injury, toxic/metabolic encephalopathies, or medication effect from Precedex and Fentanyl.  The absence of epileptiform discharges does not rule out a clinical diagnosis of epilepsy.  Clinical correlation is advised.  VIDEO EEG from 11/13/16 -> 11/14/16  Interpretation: This is a normal continuous EEG. No epileptiform discharges, focal or lateralizing signs are seen. There is no unequivocal evidence of encephalopathy.   Antimicrobials:  Anti-infectives    None     Subjective: Seen and examined at bedside and was feeling better and breathing better. Slept ok. No CP or SOB. Denied any other complaints or concerns at this time.   Objective: Vitals:   11/15/16 0020 11/15/16 0521 11/15/16 0742 11/15/16 1304  BP: 98/69 104/73  112/69  Pulse: 77 81  84  Resp: 18 18  20   Temp: 99.1 F (37.3 C) 98.4 F (36.9 C)  (!) 97.5 F (36.4 C)  TempSrc: Oral  Oral  Oral  SpO2: 100% 98% 98% 98%  Weight:  54.8 kg (120 lb 14.4 oz)    Height:        Intake/Output Summary (Last 24 hours) at 11/15/16 1536 Last data filed at 11/15/16 1348  Gross per 24 hour  Intake              720 ml  Output             1100 ml  Net             -380 ml   Filed Weights   11/13/16 0500 11/14/16 1305 11/15/16 0521  Weight: 57.8 kg (127 lb 6.8 oz) 54.9 kg (121 lb 1 oz) 54.8 kg (120 lb 14.4 oz)   Examination: Physical Exam:  Constitutional: Thin and cachectic AAM in NAD and appears calm and comfortable Eyes: Lids and conjunctivae normal, sclerae anicteric; Has Arcus Senilis  ENMT: External Ears, Nose appear normal. Grossly normal hearing.   Neck: Appears normal, supple, no cervical masses, normal ROM, no appreciable thyromegaly, no JVD Respiratory: Diminished to auscultation bilaterally, no wheezing, rales, rhonchi or crackles. Normal respiratory effort and patient is not tachypenic. No accessory muscle use.  Cardiovascular: RRR, no murmurs / rubs / gallops. S1 and S2 auscultated. No extremity edema.  Abdomen: Soft, non-tender, non-distended. No  masses palpated. No appreciable hepatosplenomegaly. Bowel sounds positive x4  GU: Deferred. Musculoskeletal: No clubbing / cyanosis of digits/nails. No joint deformity upper and lower extremities.  Skin: No rashes, lesions, ulcers on limited skin evaluation. No induration; Warm and dry.  Neurologic: CN 2-12 grossly intact with no focal deficits. Sensation intact in all 4 Extremities. Romberg sign cerebellar reflexes not assessed.  Psychiatric: Normal judgment and insight. Alert and oriented x 3. Normal mood and appropriate affect.   Data Reviewed: I have personally reviewed following labs and imaging studies  CBC:  Recent Labs Lab 11/10/16 1326 11/11/16 0212 11/12/16 0941 11/13/16 0214 11/14/16 0605 11/15/16 0209  WBC 9.5 5.9 8.3 8.5 9.9 9.4  NEUTROABS 6.7  --   --   --   --   --   HGB 14.2 13.4 14.9 13.3 14.5  13.4  HCT 44.3 41.8 46.0 41.6 45.8 41.6  MCV 87.7 85.0 87.8 86.8 88.4 87.0  PLT 147* 178 143* 169 149* 167   Basic Metabolic Panel:  Recent Labs Lab 11/11/16 0212  11/11/16 1702 11/12/16 0941 11/13/16 0214 11/14/16 0605 11/15/16 0209  NA 137  --   --  143 141 138 134*  K 3.2*  --   --  3.6 3.3* 3.4* 3.3*  CL 101  --   --  104 103 98* 98*  CO2 25  --   --  24 28 28 25   GLUCOSE 139*  --   --  94 75 95 129*  BUN 17  --   --  23* 27* 18 25*  CREATININE 1.18  --   --  1.23 1.21 1.22 1.36*  CALCIUM 9.3  --   --  9.2 9.0 9.3 9.1  MG 2.2  < > 2.3 2.3 2.3 2.2 2.4  PHOS 2.5  < > 2.8 4.1 4.7* 3.4 3.5  < > = values in this interval not displayed. GFR: Estimated Creatinine Clearance: 43.1 mL/min (A) (by C-G formula based on SCr of 1.36 mg/dL (H)). Liver Function Tests:  Recent Labs Lab 11/10/16 1326  AST 39  33  ALT 23  22  ALKPHOS 109  118  BILITOT 0.9  1.0  PROT 6.4*  6.5  ALBUMIN 3.7  3.7   No results for input(s): LIPASE, AMYLASE in the last 168 hours. No results for input(s): AMMONIA in the last 168 hours. Coagulation Profile:  Recent Labs Lab 11/10/16 1514  INR 1.04   Cardiac Enzymes:  Recent Labs Lab 11/10/16 1519 11/10/16 2154 11/11/16 0212  TROPONINI <0.03 0.03* 0.03*   BNP (last 3 results) No results for input(s): PROBNP in the last 8760 hours. HbA1C: No results for input(s): HGBA1C in the last 72 hours. CBG:  Recent Labs Lab 11/13/16 1929 11/13/16 2330 11/14/16 0333 11/14/16 0808 11/14/16 1204  GLUCAP 111* 102* 92 143* 142*   Lipid Profile: No results for input(s): CHOL, HDL, LDLCALC, TRIG, CHOLHDL, LDLDIRECT in the last 72 hours. Thyroid Function Tests: No results for input(s): TSH, T4TOTAL, FREET4, T3FREE, THYROIDAB in the last 72 hours. Anemia Panel: No results for input(s): VITAMINB12, FOLATE, FERRITIN, TIBC, IRON, RETICCTPCT in the last 72 hours. Sepsis Labs:  Recent Labs Lab 11/10/16 1627 11/10/16 1816  LATICACIDVEN 1.9  1.9    Recent Results (from the past 240 hour(s))  MRSA PCR Screening     Status: None   Collection Time: 11/10/16  5:07 PM  Result Value Ref Range Status   MRSA by PCR NEGATIVE NEGATIVE Final    Comment:  The GeneXpert MRSA Assay (FDA approved for NASAL specimens only), is one component of a comprehensive MRSA colonization surveillance program. It is not intended to diagnose MRSA infection nor to guide or monitor treatment for MRSA infections.     Radiology Studies: No results found.  Scheduled Meds: . clonazePAM  0.5 mg Per Tube BID  . ipratropium-albuterol  3 mL Nebulization BID  . pneumococcal 23 valent vaccine  0.5 mL Intramuscular Tomorrow-1000  . potassium chloride  40 mEq Oral BID  . rivaroxaban  15 mg Oral Daily   Continuous Infusions: . sodium chloride    . feeding supplement (VITAL 1.5 CAL) Stopped (11/13/16 1200)    LOS: 5 days   Merlene Laughter, DO [facility] Pager [phone]  If 7PM-7AM, please contact night-coverage [email or web address] Password [facility] 11/15/2016, 3:36 PM

## 2016-11-16 ENCOUNTER — Inpatient Hospital Stay (HOSPITAL_COMMUNITY): Payer: Medicare Other

## 2016-11-16 DIAGNOSIS — Z0189 Encounter for other specified special examinations: Secondary | ICD-10-CM

## 2016-11-16 DIAGNOSIS — N390 Urinary tract infection, site not specified: Secondary | ICD-10-CM

## 2016-11-16 DIAGNOSIS — I5022 Chronic systolic (congestive) heart failure: Secondary | ICD-10-CM

## 2016-11-16 DIAGNOSIS — R1084 Generalized abdominal pain: Secondary | ICD-10-CM

## 2016-11-16 DIAGNOSIS — R109 Unspecified abdominal pain: Secondary | ICD-10-CM

## 2016-11-16 LAB — URINALYSIS, ROUTINE W REFLEX MICROSCOPIC
Bilirubin Urine: NEGATIVE
GLUCOSE, UA: NEGATIVE mg/dL
KETONES UR: NEGATIVE mg/dL
Nitrite: NEGATIVE
PROTEIN: 100 mg/dL — AB
Specific Gravity, Urine: 1.009 (ref 1.005–1.030)
pH: 6 (ref 5.0–8.0)

## 2016-11-16 LAB — COMPREHENSIVE METABOLIC PANEL
ALBUMIN: 3.5 g/dL (ref 3.5–5.0)
ALK PHOS: 63 U/L (ref 38–126)
ALT: 26 U/L (ref 17–63)
AST: 28 U/L (ref 15–41)
Anion gap: 11 (ref 5–15)
BILIRUBIN TOTAL: 1 mg/dL (ref 0.3–1.2)
BUN: 19 mg/dL (ref 6–20)
CALCIUM: 9.2 mg/dL (ref 8.9–10.3)
CO2: 20 mmol/L — ABNORMAL LOW (ref 22–32)
Chloride: 102 mmol/L (ref 101–111)
Creatinine, Ser: 1.29 mg/dL — ABNORMAL HIGH (ref 0.61–1.24)
GFR calc Af Amer: >60 mL/min (ref >60)
GFR calc non Af Amer: 57 mL/min — ABNORMAL LOW (ref >60)
GLUCOSE: 153 mg/dL — AB (ref 65–99)
Potassium: 4.4 mmol/L (ref 3.5–5.1)
Sodium: 133 mmol/L — ABNORMAL LOW (ref 135–145)
TOTAL PROTEIN: 7 g/dL (ref 6.5–8.1)

## 2016-11-16 LAB — CBC WITH DIFFERENTIAL/PLATELET
BASOS ABS: 0 10*3/uL (ref 0.0–0.1)
BASOS PCT: 0 %
Eosinophils Absolute: 0.1 10*3/uL (ref 0.0–0.7)
Eosinophils Relative: 1 %
HEMATOCRIT: 42.8 % (ref 39.0–52.0)
Hemoglobin: 13.9 g/dL (ref 13.0–17.0)
Lymphocytes Relative: 15 %
Lymphs Abs: 1.5 10*3/uL (ref 0.7–4.0)
MCH: 28.2 pg (ref 26.0–34.0)
MCHC: 32.5 g/dL (ref 30.0–36.0)
MCV: 86.8 fL (ref 78.0–100.0)
MONOS PCT: 13 %
Monocytes Absolute: 1.3 10*3/uL — ABNORMAL HIGH (ref 0.1–1.0)
NEUTROS ABS: 7.4 10*3/uL (ref 1.7–7.7)
Neutrophils Relative %: 71 %
Platelets: 191 10*3/uL (ref 150–400)
RBC: 4.93 MIL/uL (ref 4.22–5.81)
RDW: 15 % (ref 11.5–15.5)
WBC: 10.3 10*3/uL (ref 4.0–10.5)

## 2016-11-16 LAB — MAGNESIUM: Magnesium: 2.3 mg/dL (ref 1.7–2.4)

## 2016-11-16 LAB — PHOSPHORUS: Phosphorus: 2.5 mg/dL (ref 2.5–4.6)

## 2016-11-16 MED ORDER — DEXTROSE 5 % IV SOLN
1.0000 g | INTRAVENOUS | Status: DC
  Administered 2016-11-16 – 2016-11-19 (×4): 1 g via INTRAVENOUS
  Filled 2016-11-16 (×4): qty 10

## 2016-11-16 MED ORDER — PANTOPRAZOLE SODIUM 40 MG PO TBEC
40.0000 mg | DELAYED_RELEASE_TABLET | Freq: Two times a day (BID) | ORAL | Status: DC
  Administered 2016-11-16 – 2016-11-19 (×7): 40 mg via ORAL
  Filled 2016-11-16 (×7): qty 1

## 2016-11-16 MED ORDER — ATORVASTATIN CALCIUM 40 MG PO TABS
40.0000 mg | ORAL_TABLET | Freq: Every day | ORAL | Status: DC
Start: 2016-11-16 — End: 2016-11-19
  Administered 2016-11-16 – 2016-11-18 (×3): 40 mg via ORAL
  Filled 2016-11-16 (×3): qty 1

## 2016-11-16 MED ORDER — FUROSEMIDE 40 MG PO TABS
40.0000 mg | ORAL_TABLET | Freq: Every day | ORAL | Status: DC
  Administered 2016-11-16 – 2016-11-19 (×4): 40 mg via ORAL
  Filled 2016-11-16 (×4): qty 1

## 2016-11-16 MED ORDER — CLONAZEPAM 0.5 MG PO TABS
0.5000 mg | ORAL_TABLET | Freq: Two times a day (BID) | ORAL | Status: DC
  Administered 2016-11-16 – 2016-11-18 (×4): 0.5 mg via ORAL
  Filled 2016-11-16 (×4): qty 1

## 2016-11-16 NOTE — Evaluation (Signed)
Occupational Therapy Evaluation Patient Details Name: Martin AbedMelvin L Vazquez MRN: [id number] DOB: 01/28/1951 Today's Date: 11/16/2016    History of Present Illness Pt is a 66 yo male admitted through the ED on 11/10/16 with severe dyspnea and became unresponsive in ED requiring intubation on 11/10/16. Pt self extubated himself on 11/12/16 and was reintubated with increased confusion noted. Pt was finally extubated on 11/13/16 per his request when he was more lucid and aware. Pt was transferred from ICU to 3E on 11/14/16. PMH significant for A-fib, CHF, chornic systolic CHF, COPD, Drug abuse (cocaine), heartburn, HTN and stroke.    Clinical Impression   PT admitted with dyspnea with intubation for 3 days. . Pt currently with functional limitiations due to the deficits listed below (see OT problem list). PT currently with balance deficits affecting all adls at a min to mod level for transfers. Pt relies heavily on environmental supports. Pt self reports near fall this admission prior to OT entering attempting to change gown at sink. Pt states "i got myself back over here to the bed and this is the worst hosptial stay yet. Last time I was here three days and was on the breathing tube and walked right back out. " Pt will benefit from skilled OT to increase their independence and safety with adls and balance to allow discharge SNF.     Follow Up Recommendations  SNF    Equipment Recommendations  Other (comment) (defer to next venue)    Recommendations for Other Services       Precautions / Restrictions Precautions Precautions: Fall Restrictions Weight Bearing Restrictions: No      Mobility Bed Mobility Overal bed mobility: Needs Assistance Bed Mobility: Sit to Supine     Supine to sit: Supervision     General bed mobility comments: cues to position higher in the bed  Transfers   Equipment used:  (pt refused RW use this session)   Sit to Stand: Min guard         General transfer comment:  close guard due to leaning against bed surface    Balance Overall balance assessment: Needs assistance Sitting-balance support: No upper extremity supported;Feet supported Sitting balance-Leahy Scale: Fair     Standing balance support: During functional activity;No upper extremity supported Standing balance-Leahy Scale: Poor Standing balance comment: reliant on the posterior support of the bed surface                           ADL either performed or assessed with clinical judgement   ADL Overall ADL's : Needs assistance/impaired Eating/Feeding: Set up;Sitting Eating/Feeding Details (indicate cue type and reason): pt with po intake of fluids pudding cake only. refused meal and bread Grooming: Wash/dry face;Wash/dry hands;Set up;Sitting       Lower Body Bathing: Min guard;Sit to/from stand Lower Body Bathing Details (indicate cue type and reason): leaning against bed surface Upper Body Dressing : Minimal assistance;Standing                     General ADL Comments: pt reports "i aint getting up no more I almost fell and caught myself right there" pt noted to have gown doff in the front and in the floor under the sink area. pt requesting a new gown. Hygiene performed for peri area     Vision   Vision Assessment?: No apparent visual deficits     Perception     Praxis  Pertinent Vitals/Pain Pain Assessment: No/denies pain     Hand Dominance Right   Extremity/Trunk Assessment Upper Extremity Assessment Upper Extremity Assessment: Overall WFL for tasks assessed   Lower Extremity Assessment Lower Extremity Assessment: Defer to PT evaluation;Generalized weakness   Cervical / Trunk Assessment Cervical / Trunk Assessment: Normal   Communication Communication Communication: No difficulties   Cognition Arousal/Alertness: Awake/alert Behavior During Therapy: WFL for tasks assessed/performed Overall Cognitive Status: Within Functional Limits for  tasks assessed                                     General Comments  pt reports urgency to void. urinal at bedside to decr fall risk    Exercises     Shoulder Instructions      Home Living Family/patient expects to be discharged to:: Private residence Living Arrangements: Alone Available Help at Discharge: Family;Available PRN/intermittently Type of Home: Apartment Home Access: Stairs to enter Entrance Stairs-Number of Steps: 1-2 Entrance Stairs-Rails: None Home Layout: One level     Bathroom Shower/Tub: Tub/shower unit         Home Equipment: Cane - single point          Prior Functioning/Environment Level of Independence: Independent with assistive device(s)        Comments: pt reported that he ambulates with use of SPC when out in his community. Does not use AD within his home. Independent with ADLs.        OT Problem List: Decreased strength;Decreased activity tolerance;Impaired balance (sitting and/or standing);Decreased cognition;Decreased safety awareness;Decreased knowledge of use of DME or AE;Decreased knowledge of precautions;Cardiopulmonary status limiting activity      OT Treatment/Interventions: Self-care/ADL training;Therapeutic exercise;Energy conservation;DME and/or AE instruction;Therapeutic activities;Cognitive remediation/compensation;Patient/family education;Balance training    OT Goals(Current goals can be found in the care plan section) Acute Rehab OT Goals Patient Stated Goal: to get stronger so he can go home by himself OT Goal Formulation: With patient Time For Goal Achievement: 11/30/16 Potential to Achieve Goals: Good  OT Frequency: Min 2X/week   Barriers to D/C:            Co-evaluation              AM-PAC PT "6 Clicks" Daily Activity     Outcome Measure Help from another person eating meals?: None Help from another person taking care of personal grooming?: A Little Help from another person toileting,  which includes using toliet, bedpan, or urinal?: A Little Help from another person bathing (including washing, rinsing, drying)?: A Little Help from another person to put on and taking off regular upper body clothing?: A Little Help from another person to put on and taking off regular lower body clothing?: A Little 6 Click Score: 19   End of Session Nurse Communication: Mobility status;Precautions  Activity Tolerance: Patient tolerated treatment well Patient left: in bed;with call bell/phone within reach;with bed alarm set  OT Visit Diagnosis: Unsteadiness on feet (R26.81)                Time: 1400-1415 OT Time Calculation (min): 15 min Charges:  OT General Charges $OT Visit: 1 Procedure OT Evaluation $OT Eval Moderate Complexity: 1 Procedure G-Codes:      Mateo Flow   OTR/L Pager: [phone] Office: [phone] .   Boone Master B 11/16/2016, 2:21 PM

## 2016-11-16 NOTE — Progress Notes (Signed)
PROGRESS NOTE    [name]  ZOX:[id number] DOB: 06/21/1950 DOA: 11/10/2016 PCP: [facility]  Brief Narrative:  66 year old male with PMH of COPD, systolic HF (EF 25%), and polysubstance abuse (cocaine). Presented to ED on 7/3 with severe shortness breath by EMS. Upon arrival to ED patient requesting to be intubated as he felt he was suffering. Moments later he became minimally response and was immediately intubated. ABG 7.26/59/123. WBC 9.5, Troponin 0.03, BNP 1932.3. PCCM asked to admit. Recent admission on 3/1 and 3/29 with acute hypoxic respiratory failure in setting of pulmonary edema requiring intubation. During both admissions it was documented that he tests positive for cocaine. Patient was extubated 11/13/16 and transferred to [facility] on 11/15/16. He is feeling improved breathing wise but this AM he had some abdominal pain and stated that he was having urinary discomfort and burning. Urinalysis sent down and is indicative of UTI so he was started on IV Ceftriaxone with Urine Cx pending. Patient will go for CT of the Abdomen and Pelvis to evaluate his Abdominal Pain.   Assessment & Plan:   Active Problems:   Polysubstance abuse   Essential hypertension   Acute respiratory failure with hypoxia (HCC)   Chronic systolic congestive heart failure (HCC)   Flash pulmonary edema (HCC)   Hypokalemia   Acute on chronic systolic heart failure (HCC)   Atrial fibrillation (HCC)   Substance abuse   Hypertension   Acute on chronic heart failure (HCC)   Ventilator dependent (HCC)   Acute encephalopathy   AKI (acute kidney injury) (HCC)   Hyperglycemia   Acute metabolic encephalopathy   Hyponatremia  Acute Respiratory Failure with Hypoxia in the Setting of Pulmonary Edema from Systolic CHF exacerbation in the setting of Cocaine abuse, improved -Patient is s/p Extubation -C/w DuoNeb 3 mL BID -Continuous Pulse Oximetry and Maintain O2 Saturations >92% -C/w  Supplemental O2 as Needed and Wean as tolerated -Patient is on Room Air Now -Will need Walk Screen prior to D/C  Acute Encephalopathy in the Setting of Sedation, improved  -Resolved and ? Sedation relation -Neurology Consulted for further Reccs and feel he has low suspicion for seizures -EEG on 11/13/16 was showe sedated EEG is abnormal due to moderate diffuse slowing of the background. Clinical Correlation of the above findings indicates diffuse cerebral dysfunction that is non-specific in etiology and can be seen with hypoxic/ischemic injury, toxic/metabolic encephalopathies, or medication effect from Precedex and Fentanyl.  The absence of epileptiform discharges does not rule out a clinical diagnosis of epilepsy.   -Continuous VEEG was interpreted as a normal continuous EEG. No epileptiform discharges, focal or lateralizing signs are seen. There is no unequivocal evidence of encephalopathy -Neurology Signed off and appreciated their assistance  -C/w Clonazepam 0.5 mg po BID  Hx of Atrial Fibrillation -C/w Telemetry -Heparin gtt stopped and Xarelto started with Pharmacy to dose -Will likely need to restart Carvedilol 3.125 mg po BID in AM -Xarelto 20 mg po Daily decreased to 15 mg po Daily per Pharmacy because of Cr Clearance   Acute Decompensation of Chronic Systolic CHF with an EF of 25% in association with Ischemic Cardiomyopathy -C/w Telemetry -Strict I's/O's, Daily Weights, SLIV -BNP was 1932.3 -IV 40 mg Lasix q6h D/C'd; Restarted po Lasix this AM -Patient is -1.813 Liters; Patient has lost 17 lbs -Will likely need to restart Carvedilol 3.125 mg po BID in AM and Lisinopril 2.5 mg po Daily eventually and may start tomorrow but holding  today given recent AKI and Cocaine Abuse  -Continue to Monitor Volume Status   Hypokalemia, improved -Likely from Diuresis  -Patient's K+ was 3.2 yesterday and improved to 4.4 -Continue to Monitor and Replete as Necessary -Repeat CMP in AM  COPD    -Currently not in exacerbation -As above  Hyperglycemia -CBG's ranging from 92-143 -If Necessary Add Sensitive Novolog SSI  AKI -Likely from Aggressive Diuresis   -Patient's BUN/Cr went from 17/1.18 -> 27/1.21 -> 18/1.22 -> 25/1.36 -> 19/1.29 -Restarted po Diuresis with 40 mg of Lasix daily -Restartred Home Lasix of 40 mg po Daily; Will need to start Lisinopril 2.5 mg po daily soon -May consider obtaining Retroperitoneal U/S -Continue to Monitor and Repeat CMP in AM  Polysubstance/Cocaine Abuse -Counseling Given -Avoid BB for Now given Recent Cocaine Abuse -UDS + for Cocaine as well as + Benzodiazepines   Essential Hypertension -BP was on the softer side and controlled without medication  -Patient was given IV Diuresis -Restarted Lasix of 40 mg po Daily,  -Carvedilol 3.125 mg po BID, and Lisinopril 2.5 mg po daily held for now  Hyponatremia -Na+ was 133 this AM; Yesterday was 134 -Continue to Monitor and Repeat CMP in AM  Thrombocytopenia -Improved -Patient's Platelet Count went from 149 -> 167 -> 191 -Continue to Monitor and repeat CBC in AM  Abdominal Pain -Obtain CT Abdomen w/o Contrast  Urinary Burning and Discomfort likely indicative of UTI; Suspect Hospital Acquired -UA on 11/10/16 was Clear -Checked Urinalysis today 7/918 and showed Many Bacteria, Moderate Hgb, Large Leukocytes, Negative Nitrite, and TNTC WBC and urinary symptoms of burning and discomfort -Urine Cx Pending -Will Empirically start IV Ceftriaxone  DVT prophylaxis: Anticoagulated with Xarelto 15 mg po Daily Code Status: FULL CODE Family Communication: No Family present at bedside Disposition Plan: SNF  Consultants:   PCCM Transfer  Neurology  Procedures:   Intubated 7/3 and Self Extubated 7/6, Reintubated and then Extubated.   EEG on 11/13/16  Impression: This sedated EEG is abnormal due to moderate diffuse slowing of the background.  Clinical Correlation of the above findings  indicates diffuse cerebral dysfunction that is non-specific in etiology and can be seen with hypoxic/ischemic injury, toxic/metabolic encephalopathies, or medication effect from Precedex and Fentanyl.  The absence of epileptiform discharges does not rule out a clinical diagnosis of epilepsy.  Clinical correlation is advised.  VIDEO EEG from 11/13/16 -> 11/14/16  Interpretation: This is a normal continuous EEG. No epileptiform discharges, focal or lateralizing signs are seen. There is no unequivocal evidence of encephalopathy.   Antimicrobials:  Anti-infectives    None     Subjective: Seen and examined at bedside and was complaining of Abdominal Pain and burning and discomfort with urinating. No nausea or vomiting. States SOB is improved. Open to going to SNF. No other concerns or complaints and no CP.   Objective: Vitals:   11/15/16 2132 11/16/16 0654 11/16/16 1054 11/16/16 1202  BP: 112/75 109/61  114/81  Pulse: 94 91  82  Resp: 18 18  18   Temp: 99.6 F (37.6 C) 99.4 F (37.4 C)  98 F (36.7 C)  TempSrc: Oral Oral  Oral  SpO2: 95% 96% 98% 96%  Weight:  56 kg (123 lb 8 oz)    Height:        Intake/Output Summary (Last 24 hours) at 11/16/16 1432 Last data filed at 11/16/16 1241  Gross per 24 hour  Intake             1020  ml  Output             1750 ml  Net             -730 ml   Filed Weights   11/14/16 1305 11/15/16 0521 11/16/16 0654  Weight: 54.9 kg (121 lb 1 oz) 54.8 kg (120 lb 14.4 oz) 56 kg (123 lb 8 oz)   Examination: Physical Exam:  Constitutional: Thin, cachectic AAM in NAD sitting bedside but complaining of Abdominal Pain and Urinary Discomfort Eyes: Has Arcus senilis; Lids and conjunctivae normal. ENMT: External ears and nose appear normal. Grossly normal hearing Neck: Supple with no JVD Respiratory: Diminished to Auscultation B/L. No wheezing/rales/rhonchi. Patient not tachypenic or using any accessory muscles to breathe.  Cardiovascular: RRR; No extremity  edema Abdomen: Soft, Tender to palpate on lower abdomen. Non distended bowel sounds present GU: Deferred Musculoskeletal: No cyanosis; No contractures Skin: Warm and dry. No rashes or lesions noted on a limited skin evaluation Neurologic: CN 2-12 grossly intact. No focal deficits Psychiatric: Normal mood and affect. Intact judgement and insight  Data Reviewed: I have personally reviewed following labs and imaging studies  CBC:  Recent Labs Lab 11/10/16 1326  11/12/16 0941 11/13/16 0214 11/14/16 0605 11/15/16 0209 11/16/16 0732  WBC 9.5  < > 8.3 8.5 9.9 9.4 10.3  NEUTROABS 6.7  --   --   --   --   --  7.4  HGB 14.2  < > 14.9 13.3 14.5 13.4 13.9  HCT 44.3  < > 46.0 41.6 45.8 41.6 42.8  MCV 87.7  < > 87.8 86.8 88.4 87.0 86.8  PLT 147*  < > 143* 169 149* 167 191  < > = values in this interval not displayed. Basic Metabolic Panel:  Recent Labs Lab 11/12/16 0941 11/13/16 0214 11/14/16 0605 11/15/16 0209 11/16/16 0732  NA 143 141 138 134* 133*  K 3.6 3.3* 3.4* 3.3* 4.4  CL 104 103 98* 98* 102  CO2 24 28 28 25  20*  GLUCOSE 94 75 95 129* 153*  BUN 23* 27* 18 25* 19  CREATININE 1.23 1.21 1.22 1.36* 1.29*  CALCIUM 9.2 9.0 9.3 9.1 9.2  MG 2.3 2.3 2.2 2.4 2.3  PHOS 4.1 4.7* 3.4 3.5 2.5   GFR: Estimated Creatinine Clearance: 46.4 mL/min (A) (by C-G formula based on SCr of 1.29 mg/dL (H)). Liver Function Tests:  Recent Labs Lab 11/10/16 1326 11/16/16 0732  AST 39  33 28  ALT 23  22 26   ALKPHOS 109  118 63  BILITOT 0.9  1.0 1.0  PROT 6.4*  6.5 7.0  ALBUMIN 3.7  3.7 3.5   No results for input(s): LIPASE, AMYLASE in the last 168 hours. No results for input(s): AMMONIA in the last 168 hours. Coagulation Profile:  Recent Labs Lab 11/10/16 1514  INR 1.04   Cardiac Enzymes:  Recent Labs Lab 11/10/16 1519 11/10/16 2154 11/11/16 0212  TROPONINI <0.03 0.03* 0.03*   BNP (last 3 results) No results for input(s): PROBNP in the last 8760 hours. HbA1C: No  results for input(s): HGBA1C in the last 72 hours. CBG:  Recent Labs Lab 11/13/16 1929 11/13/16 2330 11/14/16 0333 11/14/16 0808 11/14/16 1204  GLUCAP 111* 102* 92 143* 142*   Lipid Profile: No results for input(s): CHOL, HDL, LDLCALC, TRIG, CHOLHDL, LDLDIRECT in the last 72 hours. Thyroid Function Tests: No results for input(s): TSH, T4TOTAL, FREET4, T3FREE, THYROIDAB in the last 72 hours. Anemia Panel: No results for input(s): VITAMINB12, FOLATE,  FERRITIN, TIBC, IRON, RETICCTPCT in the last 72 hours. Sepsis Labs:  Recent Labs Lab 11/10/16 1627 11/10/16 1816  LATICACIDVEN 1.9 1.9    Recent Results (from the past 240 hour(s))  MRSA PCR Screening     Status: None   Collection Time: 11/10/16  5:07 PM  Result Value Ref Range Status   MRSA by PCR NEGATIVE NEGATIVE Final    Comment:        The GeneXpert MRSA Assay (FDA approved for NASAL specimens only), is one component of a comprehensive MRSA colonization surveillance program. It is not intended to diagnose MRSA infection nor to guide or monitor treatment for MRSA infections.     Radiology Studies: No results found.  Scheduled Meds: . atorvastatin  40 mg Oral q1800  . clonazePAM  0.5 mg Per Tube BID  . furosemide  40 mg Oral Daily  . ipratropium-albuterol  3 mL Nebulization BID  . pantoprazole  40 mg Oral BID  . potassium chloride  40 mEq Oral BID  . rivaroxaban  15 mg Oral Daily   Continuous Infusions: . sodium chloride    . feeding supplement (VITAL 1.5 CAL) Stopped (11/13/16 1200)    LOS: 6 days   Merlene Laughter, DO [facility] Pager [phone]  If 7PM-7AM, please contact night-coverage [email or web address] Password [email or web address] 11/16/2016, 2:32 PM

## 2016-11-16 NOTE — Progress Notes (Signed)
Pt given 1st bottle or oral contrast at 1225.

## 2016-11-16 NOTE — Progress Notes (Signed)
ANTIBIOTIC CONSULT NOTE - INITIAL  Pharmacy Consult for Ceftriaxone Indication: UTI  No Known Allergies  Patient Measurements: Height: 6\' 1"  (185.4 cm) Weight: 123 lb 8 oz (56 kg) IBW/kg (Calculated) : 79.9  Vital Signs: Temp: 98 F (36.7 C) (07/09 1202) Temp Source: Oral (07/09 1202) BP: 114/81 (07/09 1202) Pulse Rate: 82 (07/09 1202) Intake/Output from previous day: 07/08 0701 - 07/09 0700 In: 1380 [P.O.:1380] Out: 1500 [Urine:1500] Intake/Output from this shift: Total I/O In: 120 [P.O.:120] Out: 550 [Urine:550]  Labs:  Recent Labs  11/14/16 0605 11/15/16 0209 11/16/16 0732  WBC 9.9 9.4 10.3  HGB 14.5 13.4 13.9  PLT 149* 167 191  CREATININE 1.22 1.36* 1.29*   Estimated Creatinine Clearance: 46.4 mL/min (A) (by C-G formula based on SCr of 1.29 mg/dL (H)).  Assessment:  To begin Ceftriaxone for UTI.  Urine culture to be sent.  Microbiology:   7/9 urine -   7/3 MRSA PCR - negative   7/3 sputum - no sample  Goal of Therapy:  appropriate Ceftriaxone dose for indication  Plan:    Ceftriaxone 1 gm IV q24hrs.   No adjustment needed for renal function.   Will follow up urine culture with you.  Dennie Fettersgan, Darnelle Derrick Donovan, [place] Pager: [phone] 11/16/2016,3:07 PM

## 2016-11-16 NOTE — Clinical Social Work Note (Signed)
Clinical Social Work Assessment  Patient Details  Name: [name] MRN: [id number] Date of Birth: 1950/12/21  Date of referral:  11/16/16               Reason for consult:  Facility Placement, Discharge Planning                Permission sought to share information with:  Chartered certified accountant granted to share information::  Yes, Verbal Permission Granted  Name::        Agency::  SNF's  Relationship::     Contact Information:     Housing/Transportation Living arrangements for the past 2 months:  Apartment Source of Information:  Patient, Medical Team Patient Interpreter Needed:  None Criminal Activity/Legal Involvement Pertinent to Current Situation/Hospitalization:  No - Comment as needed Significant Relationships:  Adult Children Lives with:  Self Do you feel safe going back to the place where you live?  Yes Need for family participation in patient care:  Yes (Comment)  Care giving concerns:  PT recommending SNF once medically stable for discharge.   Social Worker assessment / plan:  CSW met with patient. No supports at bedside. CSW introduced role and explained that PT recommendations would be discussed. Patient agreeable to SNF. No preference for facility because he is not familiar with any of the local facilities. CSW offered SNF list but patient stated it would do him no good. He prefers for CSW to review bed offers with him when available. No further concerns. CSW encouraged patient to contact CSW as needed. CSW will continue to follow patient for support and facilitate discharge to SNF once medically stable.  Employment status:  Disabled (Comment on whether or not currently receiving Disability) Insurance information:  Medicare PT Recommendations:  [place] / Referral to community resources:  [place]  Patient/Family's Response to care:  Patient agreeable to SNF placement. Patient's family supportive and  involved in patient's care. Patient appreciated social work intervention.  Patient/Family's Understanding of and Emotional Response to Diagnosis, Current Treatment, and Prognosis:  Patient has a good understanding of the reason for admission and his need for rehab prior to returning home. He notes that he will go to SNF if it is "what is best" for him. Patient appears pleased with hospital care.  Emotional Assessment Appearance:  Appears stated age Attitude/Demeanor/Rapport:  Other (Pleasant) Affect (typically observed):  Accepting, Appropriate, Calm, Pleasant Orientation:  Oriented to Self, Oriented to Place, Oriented to  Time, Oriented to Situation Alcohol / Substance use:  Illicit Drugs Psych involvement (Current and /or in the community):  No (Comment)  Discharge Needs  Concerns to be addressed:  Care Coordination Readmission within the last 30 days:  No Current discharge risk:  Dependent with Mobility, Lives alone, Substance Abuse Barriers to Discharge:  Continued Medical Work up   Candie Chroman, LCSW 11/16/2016, 12:21 PM

## 2016-11-16 NOTE — NC FL2 (Signed)
[place] MEDICAID FL2 LEVEL OF CARE SCREENING TOOL     IDENTIFICATION  Patient Name: [name] Birthdate: July 22, 1950 Sex: male Admission Date (Current Location): 11/10/2016  [facility] and [place] Number:  Producer, television/film/video and Address:  [facility] [place], [address] [place], [place] [address]      Provider Number: [id number]  Attending Physician Name and Address:  Merlene Laughter, DO  Relative Name and Phone Number:       Current Level of Care: Hospital Recommended Level of Care: Skilled Nursing Facility Prior Approval Number:    Date Approved/Denied:   PASRR Number: [id number]  Discharge Plan: SNF    Current Diagnoses: Patient Active Problem List   Diagnosis Date Noted  . AKI (acute kidney injury) (HCC) 11/15/2016  . Hyperglycemia 11/15/2016  . Acute metabolic encephalopathy 11/15/2016  . Hyponatremia 11/15/2016  . Acute encephalopathy   . Acute on chronic heart failure (HCC) 11/10/2016  . Ventilator dependent (HCC)   . Substance abuse   . Heartburn   . Hypertension   . Pulmonary edema   . Respiratory failure (HCC)   . Acute respiratory failure with hypercapnia (HCC) 08/06/2016  . Atrial fibrillation (HCC) 07/13/2016  . Chronic bronchitis (HCC) 07/13/2016  . Respiratory failure (HCC) 07/09/2016  . Pneumothorax on right   . 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 COPD exacerbation (HCC)   . History of CVA (cerebrovascular accident)   . Acute on chronic systolic heart failure (HCC) 07/08/2011  . Hypokalemia 07/07/2011  . Flash pulmonary edema (HCC) 07/06/2011  . 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 Abnormal EKG 07/06/2011  . Chronic systolic congestive heart failure (HCC) 05/05/2011  . Acute respiratory failure with hypoxia (HCC) 05/03/2011  . Polysubstance abuse 03/25/2010  . Essential hypertension 03/25/2010    Orientation RESPIRATION BLADDER Height & Weight     Self, Time, Situation,  Place  Normal Continent Weight: 123 lb 8 oz (56 kg) Height:  6\' 1"  (185.4 cm)  BEHAVIORAL SYMPTOMS/MOOD NEUROLOGICAL BOWEL NUTRITION STATUS   (None)  (History of CVA) Continent Diet (Heart healthy)  AMBULATORY STATUS COMMUNICATION OF NEEDS Skin   Limited Assist Verbally Skin abrasions, Other (Comment) (Skin tear)                       Personal Care Assistance Level of Assistance  Bathing, Feeding, Dressing Bathing Assistance: Limited assistance Feeding assistance: Independent Dressing Assistance: Limited assistance     Functional Limitations Info  Sight, Hearing, Speech Sight Info: Adequate Hearing Info: Adequate Speech Info: Adequate    SPECIAL CARE FACTORS FREQUENCY  PT (By licensed PT), Blood pressure, OT (By licensed OT)     PT Frequency: 5 x week OT Frequency: 5 x week            Contractures Contractures Info: Not present    Additional Factors Info  Code Status, Allergies Code Status Info: Full Allergies Info: NKDA           Current Medications (11/16/2016):  This is the current hospital active medication list Current Facility-Administered Medications  Medication Dose Route Frequency Provider Last Rate Last Dose  . 0.9 %  sodium chloride infusion  250 mL Intravenous PRN Tobey Grim, NP      . atorvastatin (LIPITOR) tablet 40 mg  40 mg Oral q1800 Sheikh, Omair Latif, DO      . clonazePAM [facility]) tablet 0.5 mg  0.5 mg Per Tube BID Lupita Leash, MD  0.5 mg at 11/16/16 1046  . diphenhydrAMINE (BENADRYL) injection 12.5 mg  12.5 mg Intravenous Q6H PRN Karl ItoSommer, Steven E, MD   12.5 mg at 11/10/16 2046  . feeding supplement (VITAL 1.5 CAL) liquid 1,000 mL  1,000 mL Per Tube Continuous Nelda BucksFeinstein, Daniel J, MD   Stopped at 11/13/16 1200  . furosemide (LASIX) tablet 40 mg  40 mg Oral Daily Marguerita MerlesSheikh, Omair [place]Latif, DO   40 mg at 11/16/16 1046  . ipratropium-albuterol (DUONEB) 0.5-2.5 (3) MG/3ML nebulizer solution 3 mL  3 mL Nebulization BID Kalman Shanamaswamy,  Murali, MD   3 mL at 11/16/16 1054  . pantoprazole (PROTONIX) EC tablet 40 mg  40 mg Oral BID Marguerita MerlesSheikh, Omair [place] MillsLatif, DO   40 mg at 11/16/16 1046  . polyethylene glycol (MIRALAX / GLYCOLAX) packet 17 g  17 g Oral Daily PRN Jeneen RinksYoo, Elsia J, DO      . potassium chloride SA (K-DUR,KLOR-CON) CR tablet 40 mEq  40 mEq Oral BID Sheikh, Omair Latif, DO   40 mEq at 11/16/16 1046  . Rivaroxaban (XARELTO) tablet 15 mg  15 mg Oral Daily Lonia BloodMcClung, Jeffrey T, MD   15 mg at 11/16/16 1046     Discharge Medications: Please see discharge summary for a list of discharge medications.  Relevant Imaging Results:  Relevant Lab Results:   Additional Information SS#: [id number]  Margarito LinerSarah C Eulah Walkup, LCSW

## 2016-11-16 NOTE — Clinical Social Work Placement (Signed)
   CLINICAL SOCIAL WORK PLACEMENT  NOTE  Date:  11/16/2016  Patient Details  Name: [name] MRN: [id number] Date of Birth: 03/04/1951  Clinical Social Work is seeking post-discharge placement for this patient at the Skilled  Nursing Facility level of care (*CSW will initial, date and re-position this form in  chart as items are completed):  Yes   Patient/family provided with [facility] Clinical Social Work Department's list of facilities offering this level of care within the geographic area requested by the patient (or if unable, by the patient's family).  Yes   Patient/family informed of their freedom to choose among providers that offer the needed level of care, that participate in Medicare, Medicaid or managed care program needed by the patient, have an available bed and are willing to accept the patient.  Yes   Patient/family informed of [facility]'s ownership interest [facility] and [facility], as well as of the fact that they are under no obligation to receive care at these facilities.  PASRR submitted to EDS on 11/16/16     PASRR number received on 11/16/16     Existing PASRR number confirmed on       FL2 transmitted to all facilities in geographic area requested by pt/family on 11/16/16     FL2 transmitted to all facilities within larger geographic area on       Patient informed that his/her managed care company has contracts with or will negotiate with certain facilities, including the following:            Patient/family informed of bed offers received.  Patient chooses bed at       Physician recommends and patient chooses bed at      Patient to be transferred to   on  .  Patient to be transferred to facility by       Patient family notified on   of transfer.  Name of family member notified:        PHYSICIAN Please sign FL2     Additional Comment:    _______________________________________________ Margarito LinerSarah C Paxson Harrower, LCSW 11/16/2016,  12:24 PM

## 2016-11-17 DIAGNOSIS — E875 Hyperkalemia: Secondary | ICD-10-CM

## 2016-11-17 DIAGNOSIS — E43 Unspecified severe protein-calorie malnutrition: Secondary | ICD-10-CM

## 2016-11-17 DIAGNOSIS — R1030 Lower abdominal pain, unspecified: Secondary | ICD-10-CM

## 2016-11-17 LAB — CBC WITH DIFFERENTIAL/PLATELET
Basophils Absolute: 0 10*3/uL (ref 0.0–0.1)
Basophils Relative: 0 %
Eosinophils Absolute: 0.1 10*3/uL (ref 0.0–0.7)
Eosinophils Relative: 1 %
HEMATOCRIT: 44.5 % (ref 39.0–52.0)
HEMOGLOBIN: 15.1 g/dL (ref 13.0–17.0)
LYMPHS ABS: 1.6 10*3/uL (ref 0.7–4.0)
Lymphocytes Relative: 14 %
MCH: 28.9 pg (ref 26.0–34.0)
MCHC: 33.9 g/dL (ref 30.0–36.0)
MCV: 85.1 fL (ref 78.0–100.0)
MONOS PCT: 19 %
Monocytes Absolute: 2.1 10*3/uL — ABNORMAL HIGH (ref 0.1–1.0)
NEUTROS ABS: 7.3 10*3/uL (ref 1.7–7.7)
NEUTROS PCT: 66 %
Platelets: 192 10*3/uL (ref 150–400)
RBC: 5.23 MIL/uL (ref 4.22–5.81)
RDW: 14.7 % (ref 11.5–15.5)
WBC: 11 10*3/uL — ABNORMAL HIGH (ref 4.0–10.5)

## 2016-11-17 LAB — COMPREHENSIVE METABOLIC PANEL
ALT: 40 U/L (ref 17–63)
ANION GAP: 10 (ref 5–15)
AST: 32 U/L (ref 15–41)
Albumin: 3.4 g/dL — ABNORMAL LOW (ref 3.5–5.0)
Alkaline Phosphatase: 61 U/L (ref 38–126)
BILIRUBIN TOTAL: 1.2 mg/dL (ref 0.3–1.2)
BUN: 17 mg/dL (ref 6–20)
CO2: 21 mmol/L — ABNORMAL LOW (ref 22–32)
Calcium: 9.2 mg/dL (ref 8.9–10.3)
Chloride: 102 mmol/L (ref 101–111)
Creatinine, Ser: 1.17 mg/dL (ref 0.61–1.24)
GFR calc non Af Amer: >60 mL/min (ref >60)
Glucose, Bld: 107 mg/dL — ABNORMAL HIGH (ref 65–99)
Potassium: 5.2 mmol/L — ABNORMAL HIGH (ref 3.5–5.1)
Sodium: 133 mmol/L — ABNORMAL LOW (ref 135–145)
TOTAL PROTEIN: 6.9 g/dL (ref 6.5–8.1)

## 2016-11-17 LAB — MAGNESIUM: Magnesium: 2.5 mg/dL — ABNORMAL HIGH (ref 1.7–2.4)

## 2016-11-17 LAB — PHOSPHORUS: PHOSPHORUS: 3.4 mg/dL (ref 2.5–4.6)

## 2016-11-17 MED ORDER — BOOST / RESOURCE BREEZE PO LIQD
1.0000 | Freq: Three times a day (TID) | ORAL | Status: DC
  Administered 2016-11-17 – 2016-11-19 (×5): 1 via ORAL

## 2016-11-17 MED ORDER — TRAMADOL HCL 50 MG PO TABS: 50.0000 mg | ORAL_TABLET | Freq: Four times a day (QID) | ORAL | Status: DC | PRN

## 2016-11-17 NOTE — Progress Notes (Signed)
Physical Therapy Treatment Patient Details Name: [name] MRN: [id number] DOB: 07/28/1950 Today's Date: 11/17/2016    History of Present Illness Pt is a 66 yo male admitted through the ED on 11/10/16 with severe dyspnea and became unresponsive in ED requiring intubation on 11/10/16. Pt self exctubated himself on 11/12/16 and was reintubated with increased confusion noted. Pt was finally extubated on 11/13/16 per his request when he was more lucid and aware. Pt was transferred from ICU to 3E on 11/14/16. PMH significant for A-fib, CHF, chornic systolic CHF, COPD, Drug abuse (cocaine), heartburn, HTN and stroke.     PT Comments    Patient is progressing toward mobility goals. Continues to demonstrate decreased activity tolerance and strength. Current plan remains appropriate.    Follow Up Recommendations  SNF;Supervision for mobility/OOB     Equipment Recommendations  3in1 (PT);Rolling walker with 5" wheels    Recommendations for Other Services       Precautions / Restrictions Precautions Precautions: Fall Restrictions Weight Bearing Restrictions: No    Mobility  Bed Mobility Overal bed mobility: Needs Assistance Bed Mobility: Supine to Sit     Supine to sit: Modified independent (Device/Increase time)     General bed mobility comments: increased time/effort  Transfers Overall transfer level: Needs assistance Equipment used: Straight cane Transfers: Sit to/from Stand Sit to Stand: Min guard         General transfer comment: min guard for safety  Ambulation/Gait Ambulation/Gait assistance: Min guard Ambulation Distance (Feet):  (3475ft X2) Assistive device: Straight cane Gait Pattern/deviations: Step-through pattern;Decreased stride length;Trunk flexed Gait velocity: decreased   General Gait Details: decreased bilat step height; slow, grossly steady gait; no LOB with horizontal head turns but is unsteady at times   [employer]            Wheelchair Mobility     Modified Rankin (Stroke Patients Only)       Balance Overall balance assessment: Needs assistance Sitting-balance support: No upper extremity supported;Feet supported Sitting balance-Leahy Scale: Fair     Standing balance support: During functional activity;Single extremity supported Standing balance-Leahy Scale: Poor                              Cognition Arousal/Alertness: Awake/alert Behavior During Therapy: WFL for tasks assessed/performed Overall Cognitive Status: Within Functional Limits for tasks assessed                                        Exercises      General Comments        Pertinent Vitals/Pain Pain Assessment: No/denies pain    Home Living                      Prior Function            PT Goals (current goals can now be found in the care plan section) Acute Rehab PT Goals Patient Stated Goal: to get stronger so he can go home by himself PT Goal Formulation: With patient Time For Goal Achievement: 11/29/16 Potential to Achieve Goals: Good Progress towards PT goals: Progressing toward goals    Frequency    Min 2X/week      PT Plan Current plan remains appropriate    Co-evaluation              AM-PAC PT "  6 Clicks" Daily Activity  Outcome Measure  Difficulty turning over in bed (including adjusting bedclothes, sheets and blankets)?: None Difficulty moving from lying on back to sitting on the side of the bed? : Total Difficulty sitting down on and standing up from a chair with arms (e.g., wheelchair, bedside commode, etc,.)?: Total Help needed moving to and from a bed to chair (including a wheelchair)?: A Little Help needed walking in hospital room?: A Little Help needed climbing 3-5 steps with a railing? : A Little 6 Click Score: 15    End of Session Equipment Utilized During Treatment: Gait belt Activity Tolerance: Patient tolerated treatment well Patient left: in chair;with call  bell/phone within reach;with chair alarm set Nurse Communication: Mobility status PT Visit Diagnosis: Unsteadiness on feet (R26.81);Muscle weakness (generalized) (M62.81);Difficulty in walking, not elsewhere classified (R26.2)     Time: 1610-9604 PT Time Calculation (min) (ACUTE ONLY): 35 min  Charges:  $Gait Training: 23-37 mins                    G Codes:       [name], PTA Pager: [phone]     [name] 11/17/2016, 1:21 PM

## 2016-11-17 NOTE — Clinical Social Work Note (Signed)
CSW provided patient with bed offers. Patient does not want [facility] because he wants to stay in [place]. [facility]. Patient wanted CSW to make his decision but CSW explained that it had to be his decision. Reviewed scores for the two facilities. CSW told patient that [facility] the most requested facility. He is agreeable to that one. He wants a private room. CSW left message for admissions coordinator.  Charlynn CourtSarah Kandance Yano, CSW [phone]

## 2016-11-17 NOTE — Progress Notes (Signed)
PROGRESS NOTE    [name]  ZOX:[id number] DOB: 01-26-1951 DOA: 11/10/2016 PCP: [facility]  Brief Narrative:  66 year old male with PMH of COPD, systolic HF (EF 25%), and polysubstance abuse (cocaine). Presented to ED on 7/3 with severe shortness breath by EMS. Upon arrival to ED patient requesting to be intubated as he felt he was suffering. Moments later he became minimally response and was immediately intubated. ABG 7.26/59/123. WBC 9.5, Troponin 0.03, BNP 1932.3. PCCM asked to admit. Recent admission on 3/1 and 3/29 with acute hypoxic respiratory failure in setting of pulmonary edema requiring intubation. During both admissions it was documented that he tests positive for cocaine. Patient was extubated 11/13/16 and transferred to [facility] on 11/15/16. He is feeling improved breathing wise but this AM he had some abdominal pain and stated that he was having urinary discomfort and burning. Urinalysis sent down and is indicative of UTI so he was started on IV Ceftriaxone with Urine Cx growing >100,000 CFU of GNR. Patient went for CT of the Abdomen and Pelvis to evaluate his Abdominal Pain showed Mild bladder wall thickening with questionable perivesicular edema, may be urinary tract infection or seen with chronic bladder outlet obstruction, prostate gland appears enlarged. Otherwise no acute abnormality in the abdomen/pelvis noted on CT besides Aorta bi-iliac atherosclerosis. Patient to go to SNF at D/C and wants [facility].   Assessment & Plan:   Active Problems:   Polysubstance abuse   Essential hypertension   Acute respiratory failure with hypoxia (HCC)   Chronic systolic congestive heart failure (HCC)   Flash pulmonary edema (HCC)   Hypokalemia   Acute on chronic systolic heart failure (HCC)   Atrial fibrillation (HCC)   Substance abuse   Hypertension   Acute on chronic heart failure (HCC)   Ventilator dependent (HCC)   Acute encephalopathy   AKI  (acute kidney injury) (HCC)   Hyperglycemia   Acute metabolic encephalopathy   Hyponatremia   Abdominal pain   Acute lower UTI   Severe protein-calorie malnutrition (HCC)   Hyperkalemia  Acute Respiratory Failure with Hypoxia in the Setting of Pulmonary Edema from Systolic CHF exacerbation in the setting of Cocaine abuse, improved -Patient is s/p Extubation -C/w DuoNeb 3 mL BID -Continuous Pulse Oximetry and Maintain O2 Saturations >92% -C/w Supplemental O2 as Needed and Wean as tolerated -Patient is on Room Air Now -Will need Walk Screen prior to D/C  Acute Encephalopathy in the Setting of Sedation, improved  -Resolved and ? Sedation relation -Neurology Consulted for further Reccs and feel he has low suspicion for seizures -EEG on 11/13/16 was showe sedated EEG is abnormal due to moderate diffuse slowing of the background. Clinical Correlation of the above findings indicates diffuse cerebral dysfunction that is non-specific in etiology and can be seen with hypoxic/ischemic injury, toxic/metabolic encephalopathies, or medication effect from Precedex and Fentanyl.  The absence of epileptiform discharges does not rule out a clinical diagnosis of epilepsy.   -Continuous VEEG was interpreted as a normal continuous EEG. No epileptiform discharges, focal or lateralizing signs are seen. There is no unequivocal evidence of encephalopathy -Neurology Signed off and appreciated their assistance  -C/w Clonazepam 0.5 mg po BID  Hx of Atrial Fibrillation -C/w Telemetry -Heparin gtt stopped and Xarelto started with Pharmacy to dose -Will likely need to restart Carvedilol 3.125 mg po BID but patient's BP are soft  -Xarelto 20 mg po Daily decreased to 15 mg po Daily per Pharmacy because of  Cr Clearance   Acute Decompensation of Chronic Systolic CHF with an EF of 25% in association with Ischemic Cardiomyopathy -C/w Telemetry -Strict I's/O's, Daily Weights, SLIV -BNP was 1932.3 -IV 40 mg Lasix q6h  D/C'd; Restarted po Lasix this AM -Patient is -2.958 Liters; Patient has lost 19 lbs -Will likely need to restart Carvedilol 3.125 mg po BID in AM and Lisinopril 2.5 mg po Daily eventually and may start tomorrow but holding today given recent AKI and Cocaine Abuse  -Continue to Monitor Volume Status   Hypokalemia and now Hyperkalemia -Hypokalemia was Likely from Diuresis  -Patient's K+ was 3.2 -> 4.4 -> 5.2 -Stopped Patients po KCl 40 mEQ BID as patient was Hyperkalemic -Continue to Monitor and Replete as Necessary -Repeat CMP in AM  COPD  -Currently not in exacerbation -As above  Hyperglycemia -CBG's ranging from 92-143 -Glucose was 107 on BMP -If Necessary Add Sensitive Novolog SSI  AKI -Likely from Aggressive Diuresis   -Patient's BUN/Cr went from 17/1.18 -> 27/1.21 -> 18/1.22 -> 25/1.36 -> 19/1.29 -> 17/1.17 -Restarted po Diuresis with 40 mg of Lasix daily yesterday -Will need to start Lisinopril 2.5 mg po daily if BP permits and if Cr continues to Improve -May consider obtaining Retroperitoneal U/S -Continue to Monitor and Repeat CMP in AM  Polysubstance/Cocaine Abuse -Counseling Given -Avoid BB for Now given Recent Cocaine Abuse -UDS + for Cocaine as well as + Benzodiazepines   Essential Hypertension but BP running soft -BP was on the softer side and controlled without medication  -Patient was given IV Diuresis -Restarted Lasix of 40 mg po Dail,  -Carvedilol 3.125 mg po BID, and Lisinopril 2.5 mg po daily held for now and may restart in AM if BP permits  Mild Hyponatremia -Na+ was 133 this AM; Yesterday was 133 -Continue to Monitor and Repeat CMP in AM  Thrombocytopenia -Improved -Patient's Platelet Count went from 149 -> 167 -> 191 -> 192 -Continue to Monitor and repeat CBC in AM  Abdominal Pain -Obtained CT Abdomen w/o Contrast   -Showed Mild bladder wall thickening with questionable perivesicular edema, may be urinary tract infection or seen with chronic  bladder outlet obstruction, prostate gland appears enlarged. Otherwise no acute abnormality in the abdomen/pelvis noted on CT besides Aorta bi-iliac atherosclerosis. -Continue to Monitor and added Tramadol 50 mg po q6hprn for Pain (Avoiding IV given Hx of Substance Abuse)  GNR UTI  -UA on 11/10/16 was Clear -Checked Urinalysis today 7/918 and showed Many Bacteria, Moderate Hgb, Large Leukocytes, Negative Nitrite, and TNTC WBC and urinary symptoms of burning and discomfort -Urine Cx showing >100,000 GNR and was re-incubated for better Growth -Will Empirically start IV Ceftriaxone  Severe Protein Calorie Malnutrition -Nutritionist consulted and appreciate Recc's -C/w Boost Breeze po TID and Magic cup BID with meals  Leukocytosis -Likely from UTI -WBC went from 9.4 -> 10.3 -> 11.0 -If worsens will obtain Blood Cx -Repeat CBC in AM   DVT prophylaxis: Anticoagulated with Xarelto 15 mg po Daily Code Status: FULL CODE Family Communication: No Family present at bedside Disposition Plan: SNF when stable for D/C; Patient wants [facility]  Consultants:   PCCM Transfer  Neurology  Procedures:   Intubated 7/3 and Self Extubated 7/6, Reintubated and then Extubated.   EEG on 11/13/16  Impression: This sedated EEG is abnormal due to moderate diffuse slowing of the background.  Clinical Correlation of the above findings indicates diffuse cerebral dysfunction that is non-specific in etiology and can be seen with hypoxic/ischemic injury, toxic/metabolic  encephalopathies, or medication effect from Precedex and Fentanyl.  The absence of epileptiform discharges does not rule out a clinical diagnosis of epilepsy.  Clinical correlation is advised.  VIDEO EEG from 11/13/16 -> 11/14/16  Interpretation: This is a normal continuous EEG. No epileptiform discharges, focal or lateralizing signs are seen. There is no unequivocal evidence of encephalopathy.   Antimicrobials:  Anti-infectives    Start      Dose/Rate Route Frequency Ordered Stop   11/16/16 1600  cefTRIAXone (ROCEPHIN) 1 g in dextrose 5 % 50 mL IVPB     1 g 100 mL/hr over 30 Minutes Intravenous Every 24 hours 11/16/16 1454       Subjective: Seen and examined at bedside and was sleeping in chair at bedside and woke him up from sleep. Stated he was still having urinary burning and discomfort. Wanted to sleep. No nausea or vomiting. No CP or SOB. No other concerns or complaints.    Objective: Vitals:   11/16/16 2101 11/17/16 0618 11/17/16 0802 11/17/16 1204  BP:  100/63  93/66  Pulse:  91  84  Resp:  18  18  Temp:  99.6 F (37.6 C)  (!) 97.5 F (36.4 C)  TempSrc:  Oral  Oral  SpO2: 94% 95% 96% 98%  Weight:  55.1 kg (121 lb 8 oz)    Height:        Intake/Output Summary (Last 24 hours) at 11/17/16 1550 Last data filed at 11/17/16 1500  Gross per 24 hour  Intake              360 ml  Output             1625 ml  Net            -1265 ml   Filed Weights   11/15/16 0521 11/16/16 0654 11/17/16 0618  Weight: 54.8 kg (120 lb 14.4 oz) 56 kg (123 lb 8 oz) 55.1 kg (121 lb 8 oz)   Examination: Physical Exam:  Constitutional: Thin, cachectic chronically ill appearing AAM in NAD sleeping in chair bedside Eyes: Sclerae anicteric. Lids normal. Has Arcus Senilis ENMT: Grossly normal hearing. External Ears and nose appear normal. Neck: Supple with no JVD. Respiratory: Diminished but no wheezing/rales/rhonchi. Patient was not tachypenic or using any accessory muscles to breathe.  Cardiovascular: RRR; S1 S2. No appreciable LE edema Abdomen: Soft, Mildly tender to palpate on the Lower Abdomen; Bowel Sounds present GU: Deferred. Has condom cath in place draining yellow color urine Musculoskeletal: No contractures; No cyanosis Skin: Warm and dry. No rashes or lesions noted on a limited skin eval Neurologic: CN 2-12 grossly intact. No focal deficits Psychiatric: Drowsy but arousable. Normal mood and affect. Intact judgement and  insight.   Data Reviewed: I have personally reviewed following labs and imaging studies  CBC:  Recent Labs Lab 11/13/16 0214 11/14/16 0605 11/15/16 0209 11/16/16 0732 11/17/16 0630  WBC 8.5 9.9 9.4 10.3 11.0*  NEUTROABS  --   --   --  7.4 7.3  HGB 13.3 14.5 13.4 13.9 15.1  HCT 41.6 45.8 41.6 42.8 44.5  MCV 86.8 88.4 87.0 86.8 85.1  PLT 169 149* 167 191 192   Basic Metabolic Panel:  Recent Labs Lab 11/13/16 0214 11/14/16 0605 11/15/16 0209 11/16/16 0732 11/17/16 0630  NA 141 138 134* 133* 133*  K 3.3* 3.4* 3.3* 4.4 5.2*  CL 103 98* 98* 102 102  CO2 28 28 25  20* 21*  GLUCOSE 75 95 129* 153* 107*  BUN 27* 18 25* 19 17  CREATININE 1.21 1.22 1.36* 1.29* 1.17  CALCIUM 9.0 9.3 9.1 9.2 9.2  MG 2.3 2.2 2.4 2.3 2.5*  PHOS 4.7* 3.4 3.5 2.5 3.4   GFR: Estimated Creatinine Clearance: 50.4 mL/min (by C-G formula based on SCr of 1.17 mg/dL). Liver Function Tests:  Recent Labs Lab 11/16/16 0732 11/17/16 0630  AST 28 32  ALT 26 40  ALKPHOS 63 61  BILITOT 1.0 1.2  PROT 7.0 6.9  ALBUMIN 3.5 3.4*   No results for input(s): LIPASE, AMYLASE in the last 168 hours. No results for input(s): AMMONIA in the last 168 hours. Coagulation Profile: No results for input(s): INR, PROTIME in the last 168 hours. Cardiac Enzymes:  Recent Labs Lab 11/10/16 2154 11/11/16 0212  TROPONINI 0.03* 0.03*   BNP (last 3 results) No results for input(s): PROBNP in the last 8760 hours. HbA1C: No results for input(s): HGBA1C in the last 72 hours. CBG:  Recent Labs Lab 11/13/16 1929 11/13/16 2330 11/14/16 0333 11/14/16 0808 11/14/16 1204  GLUCAP 111* 102* 92 143* 142*   Lipid Profile: No results for input(s): CHOL, HDL, LDLCALC, TRIG, CHOLHDL, LDLDIRECT in the last 72 hours. Thyroid Function Tests: No results for input(s): TSH, T4TOTAL, FREET4, T3FREE, THYROIDAB in the last 72 hours. Anemia Panel: No results for input(s): VITAMINB12, FOLATE, FERRITIN, TIBC, IRON, RETICCTPCT in the  last 72 hours. Sepsis Labs:  Recent Labs Lab 11/10/16 1627 11/10/16 1816  LATICACIDVEN 1.9 1.9    Recent Results (from the past 240 hour(s))  MRSA PCR Screening     Status: None   Collection Time: 11/10/16  5:07 PM  Result Value Ref Range Status   MRSA by PCR NEGATIVE NEGATIVE Final    Comment:        The GeneXpert MRSA Assay (FDA approved for NASAL specimens only), is one component of a comprehensive MRSA colonization surveillance program. It is not intended to diagnose MRSA infection nor to guide or monitor treatment for MRSA infections.   Culture, Urine     Status: Abnormal (Preliminary result)   Collection Time: 11/16/16 12:09 PM  Result Value Ref Range Status   Specimen Description URINE, RANDOM  Final   Special Requests NONE  Final   Culture (A)  Final    >=100,000 COLONIES/mL GRAM NEGATIVE RODS CULTURE REINCUBATED FOR BETTER GROWTH    Report Status PENDING  Incomplete    Radiology Studies: Ct Abdomen Pelvis Wo Contrast  Result Date: 11/16/2016 CLINICAL DATA:  Abdominal pain.  Painful urination. EXAM: CT ABDOMEN AND PELVIS WITHOUT CONTRAST TECHNIQUE: Multidetector CT imaging of the abdomen and pelvis was performed following the standard protocol without IV contrast. COMPARISON:  Contrast-enhanced CT 03/05/2014 FINDINGS: Lower chest: Cardiomegaly with coronary artery calcifications. Small pericardial fluid/ effusion, stable. Linear atelectasis in both lower lobes. No confluent consolidation. No pleural fluid. Hepatobiliary: No focal hepatic lesion allowing for lack contrast. Gallbladder physiologically distended, no calcified stone. No biliary dilatation. Pancreas: Parenchymal atrophy. No ductal dilatation or inflammation. Pancreas is not well-defined given lack of IV contrast and paucity of intra-abdominal fat. Spleen: Normal in size without focal abnormality. Adrenals/Urinary Tract: No adrenal nodule. Cortical scarring in the mid lower right kidney at site of prior  infarct. Chronic scarring in the inferior left kidney. Left renal cyst was better defined on prior contrast-enhanced exam. No hydronephrosis or perinephric edema. Urinary bladder is distended but thick walled. Question of perivesicular edema. Stomach/Bowel: Stomach is decompressed. Bowel evaluation slight limited by patient motion. No evidence of  bowel wall thickening or obstruction. Enteric contrast reaches the distal small bowel. Appendix is not visualized, no pericecal inflammation. Moderate colonic stool. Vascular/Lymphatic: Aortic and bi-iliac atherosclerosis. Left external iliac stent. No aneurysm. No evidence of adenopathy allowing for lack of IV contrast. Reproductive: Prominent prostate gland spanning 5.5 cm causing mass effect on the bladder base. Other: No free air, free fluid, or intra-abdominal fluid collection. Musculoskeletal: There are no acute or suspicious osseous abnormalities. Stable Modic endplate changes at L5-S1 with endplate sclerosis and vacuum phenomenon. IMPRESSION: 1. Mild bladder wall thickening with questionable perivesicular edema, may be urinary tract infection or seen with chronic bladder outlet obstruction, prostate gland appears enlarged. 2. Otherwise no acute abnormality in the abdomen/pelvis. 3. Aorta bi-iliac atherosclerosis. Electronically Signed   By: Rubye Oaks M.D.   On: 11/16/2016 16:50    Scheduled Meds: . atorvastatin  40 mg Oral q1800  . clonazePAM  0.5 mg Oral BID  . feeding supplement  1 Container Oral TID BM  . furosemide  40 mg Oral Daily  . ipratropium-albuterol  3 mL Nebulization BID  . pantoprazole  40 mg Oral BID  . rivaroxaban  15 mg Oral Daily   Continuous Infusions: . sodium chloride    . cefTRIAXone (ROCEPHIN)  IV Stopped (11/16/16 1723)    LOS: 7 days   Merlene Laughter, DO [facility] Pager [phone]  If 7PM-7AM, please contact night-coverage [email or web address] Password [email or web address] 11/17/2016, 3:50 PM

## 2016-11-17 NOTE — Progress Notes (Signed)
Nutrition Follow-up  DOCUMENTATION CODES:   Severe malnutrition in context of chronic illness, Underweight  INTERVENTION:   Boost Breeze po TID, each supplement provides 250 kcal and 9 grams of protein  Magic cup BID with meals, each supplement provides 290 kcal and 9 grams of protein   NUTRITION DIAGNOSIS:   Malnutrition (severe) related to chronic illness (COPD) as evidenced by severe depletion of body fat, severe depletion of muscle mass.  Continues but being addressed via supplements  GOAL:   Patient will meet greater than or equal to 90% of their needs  MONITOR:   PO intake, Supplement acceptance, Labs, Weight trends  REASON FOR ASSESSMENT:   Consult, Ventilator Enteral/tube feeding initiation and management  ASSESSMENT:   Pt with PMH of COPD, HF, substance abuse (cocaine) admitted with SOB requesting intubation. Pt with recent admissions 3/1 and 3/29 for acute hypoxic respiratory failure in setting of pulmonary edema requiring intubation (positive for cocaine).   7/6 Extubated, TF discontinued with extubation 7/7 Diet advanced to Heart Healthy  Pt reports appetite is poor, only wants to eat potato chips and drink Coke today. Recorded po intake 46% on average. Pt reports abdominal pain but no N/V/D.   Pt reports he does not like nutritional supplements such as Ensure or Boost. Pt does like ice cream, will try Magic Cup supplement. Will also try Boost Breeze which is juice-like as opposed to milky.   Potassium elevated today but noted pt on lasix and KCl supplement. KCL supplement has been d/c  UOP 1575 mL in previous 24 hours (1.2 ml/kg/hr)  Labs: sodium 133 (L), potassium 5.2 (H), Creatinine wdl Meds: lasix, potassium chloride   Diet Order:  Diet Heart Room service appropriate? Yes; Fluid consistency: Thin  Skin:  Reviewed, no issues  Last BM:  unknown  Height:   Ht Readings from Last 1 Encounters:  11/14/16 6\' 1"  (1.854 m)    Weight:   Wt  Readings from Last 1 Encounters:  11/17/16 121 lb 8 oz (55.1 kg)    Ideal Body Weight:  80.9 kg  BMI:  Body mass index is 16.03 kg/m.  Estimated Nutritional Needs:   Kcal:  1700-2000 kcals  Protein:  85-100 g  Fluid:  >/= 1.7 L  EDUCATION NEEDS:   No education needs identified at this time  Romelle StarcherCate Rutha Melgoza MS, RD, LDN [phone] Pager  [phone] Weekend/On-Call Pager

## 2016-11-18 LAB — BASIC METABOLIC PANEL
ANION GAP: 10 (ref 5–15)
BUN: 21 mg/dL — AB (ref 6–20)
CHLORIDE: 101 mmol/L (ref 101–111)
CO2: 20 mmol/L — ABNORMAL LOW (ref 22–32)
Calcium: 8.7 mg/dL — ABNORMAL LOW (ref 8.9–10.3)
Creatinine, Ser: 1.22 mg/dL (ref 0.61–1.24)
GFR calc Af Amer: >60 mL/min (ref >60)
GLUCOSE: 112 mg/dL — AB (ref 65–99)
Potassium: 4.5 mmol/L (ref 3.5–5.1)
Sodium: 131 mmol/L — ABNORMAL LOW (ref 135–145)

## 2016-11-18 NOTE — Progress Notes (Signed)
ANTICOAGULATION CONSULT NOTE - Follow Up Consult  Pharmacy Consult for Xarelto Indication: atrial fibrillation  No Known Allergies  Patient Measurements: Height: 6\' 1"  (185.4 cm) Weight: 122 lb 3.2 oz (55.4 kg) IBW/kg (Calculated) : 79.9  Vital Signs: Temp: 98.6 F (37 C) (07/11 1211) Temp Source: Oral (07/11 1211) BP: 92/71 (07/11 1211) Pulse Rate: 83 (07/11 1211)  Labs:  Recent Labs  11/16/16 0732 11/17/16 0630 11/18/16 0720  HGB 13.9 15.1  --   HCT 42.8 44.5  --   PLT 191 192  --   CREATININE 1.29* 1.17 1.22    Estimated Creatinine Clearance: 48.6 mL/min (by C-G formula based on SCr of 1.22 mg/dL).  Assessment: 66 yo male on Xarelto PTA for history of Afib. He was transitioned to heparin upon intubation while he was critically ill. Transitioned to Xarelto on 7/7.   Of note: Mr. [name] was on Xarelto 20 mg at home, but based on declined renal function, warranting a dose adjustment of Xarelto to 15 mg.   Goal of Therapy:  Monitor platelets by anticoagulation protocol: Yes   Plan:  Continue Xarelto 15 mg PO once daily with meals Monitor renal function; if renal function improves (CrCl >/= 50 ml/min), switch  Back to Xarelto 20 mg.  [name], PharmD, MS 11/18/2016,1:54 PM

## 2016-11-18 NOTE — Progress Notes (Signed)
Pt expressing concern for PT or RN at home vs. Being discharged to a facility. Pt requesting to discuss with case manager. Case manager aware    Gabryela Kimbrell

## 2016-11-18 NOTE — Progress Notes (Addendum)
PROGRESS NOTE   [name]  ZOX:[id number]    DOB: 28-Jul-1950    DOA: 11/10/2016  PCP: [facility]   I have briefly reviewed patients previous medical records in [facility].  Brief Narrative:  66 year old male with PMH of COPD, chronic systolic CHF (LVEF 25%), polysubstance abuse (cocaine & tobacco), A. fib, CAD, HTN, CVA presented to ED with EMS complaining of severe dyspnea. Upon arrival to ED, patient requesting to be intubated as he felt he was suffering. Moments later he became minimally responsive and was immediately intubated. ABG 7.26/59/123. Recent admission 3/1 and 3/9 with acute respiratory failure in the setting of pulmonary edema requiring intubation and during both those admissions it was documented that he tested positive for cocaine. He was admitted to ICU by CCM. Extubated 11/13/16 and transferred to [facility] service on 11/15/16.   Assessment & Plan:   Active Problems:   Polysubstance abuse   Essential hypertension   Acute respiratory failure with hypoxia (HCC)   Chronic systolic congestive heart failure (HCC)   Flash pulmonary edema (HCC)   Hypokalemia   Acute on chronic systolic heart failure (HCC)   Atrial fibrillation (HCC)   Substance abuse   Hypertension   Acute on chronic heart failure (HCC)   Ventilator dependent (HCC)   Acute encephalopathy   AKI (acute kidney injury) (HCC)   Hyperglycemia   Acute metabolic encephalopathy   Hyponatremia   Abdominal pain   Acute lower UTI   Severe protein-calorie malnutrition (HCC)   Hyperkalemia   1. Acute respiratory failure with hypoxia: Secondary to decompensated CHF. Extubated 11/13/16. Resolved. 2. Acute on chronic systolic CHF/ischemic cardiomyopathy: In the setting of cocaine abuse. Diuresed initially with IV Lasix. -3.5 L since admission. Now transitioned to oral Lasix. Improved and stable. Blood pressures are soft limiting resumption of lisinopril. 3. Acute encephalopathy: In the  setting of sedation. Resolved. Neurology was consulted and felt low suspicion for seizures. Interim and continuous video EEG without seizure like activity. Neurology signed off. Currently on clonazepam 0.5 MG twice a day which was probably started for agitation when in ICU (not on it at home). Discontinue clonazepam and monitor. 4. Paroxysmal A. fib: Currently in sinus rhythm. Heparin infusion transitioned to Xarelto per pharmacy. Not on rate control medications PTA. Reluctant to start beta blockers due to ongoing cocaine abuse. Cannot use Cardizem due to low EF. 5. Hypokalemia/hyperkalemia: Potassium now normal at 4.5. Follow BMP closely. 6. COPD: No clinical bronchospasm. Tobacco cessation counseled. 7. Tobacco abuse: Cessation counseled. 8. Acute kidney injury: Attributed to aggressive IV diuresis. Creatinine had peaked to 1.36. This is decreased to 1.22. Follow BMP. 9. Polysubstance abuse (tobacco & cocaine): Cessation counseled. 10. Essential hypertension: Running soft blood pressures. 11. Hyponatremia: Stable. 12. Abdominal pain/gram-negative rods acute cystitis: Patient had UTI symptoms, positive urine microscopy on 11/16/16 and culture and CT abdomen suggests cystitis. Continue IV ceftriaxone pending urine culture results. Improved. 13. Severe protein calorie malnutrition: Management per dietitian recommendations.   DVT prophylaxis: Currently on therapeutic dose Xarelto Code Status: Full Family Communication: None at bedside Disposition: DC to SNF pending final urine culture results and clinical improvement, possibly 11/19/16.   Consultants:  CCM Neurology   Procedures:  Intubated 7/3 and self extubated 7/6.  EEG on 11/13/16  Impression: This sedatedEEG is abnormal due to moderatediffuse slowing of the background.  Clinical Correlation of the above findings indicates diffuse cerebral dysfunction that is non-specific in etiology and can be seen with hypoxic/ischemic  injury,  toxic/metabolic encephalopathies, or medication effect from Precedex and Fentanyl. The absence of epileptiform discharges does not rule out a clinical diagnosis of epilepsy. Clinical correlation is advised.  VIDEO EEG from 11/13/16 -> 11/14/16  Interpretation: This is a normal continuous EEG. No epileptiform discharges, focal or lateralizing signs are seen. There is no unequivocal evidence of encephalopathy  2-D echo 11/12/16: Study Conclusions  - Left ventricle: The cavity size was normal. Wall thickness was   increased in a pattern of mild LVH. Systolic function was   severely reduced. The estimated ejection fraction was in the   range of 20% to 25%.  Antimicrobials:  IV ceftriaxone   Subjective: Overall feels much better. Dyspnea significantly improved and denies any further dyspnea. No chest pain or cough reported. Still has some dysuria but improved compared to 2 days ago. No abdominal pain reported. Tolerating diet.   ROS: No dizziness or lightheadedness.  Objective:  Vitals:   11/17/16 2046 11/18/16 0657 11/18/16 0828 11/18/16 1211  BP: 102/75 (!) 89/64  92/71  Pulse: 98 91  83  Resp: 18 18  18   Temp: 98.9 F (37.2 C) 99 F (37.2 C)  98.6 F (37 C)  TempSrc: Oral Oral  Oral  SpO2: 97% 97% 96% 100%  Weight:  55.4 kg (122 lb 3.2 oz)    Height:        Examination:  General exam: Pleasant middle-aged male, small built and thinly nourished, lying comfortably propped up in bed. Respiratory system: Clear to auscultation. Respiratory effort normal. Cardiovascular system: S1 & S2 heard, RRR. No JVD, murmurs, rubs, gallops or clicks. No pedal edema. Telemetry: Sinus rhythm with BBB morphology. Gastrointestinal system: Abdomen is nondistended, soft and nontender. No organomegaly or masses felt. Normal bowel sounds heard. Central nervous system: Alert and oriented. No focal neurological deficits. Extremities: Symmetric 5 x 5 power. Skin: No rashes, lesions or  ulcers Psychiatry: Judgement and insight appear normal. Mood & affect appropriate.     Data Reviewed: I have personally reviewed following labs and imaging studies  CBC:  Recent Labs Lab 11/13/16 0214 11/14/16 0605 11/15/16 0209 11/16/16 0732 11/17/16 0630  WBC 8.5 9.9 9.4 10.3 11.0*  NEUTROABS  --   --   --  7.4 7.3  HGB 13.3 14.5 13.4 13.9 15.1  HCT 41.6 45.8 41.6 42.8 44.5  MCV 86.8 88.4 87.0 86.8 85.1  PLT 169 149* 167 191 192   Basic Metabolic Panel:  Recent Labs Lab 11/13/16 0214 11/14/16 0605 11/15/16 0209 11/16/16 0732 11/17/16 0630 11/18/16 0720  NA 141 138 134* 133* 133* 131*  K 3.3* 3.4* 3.3* 4.4 5.2* 4.5  CL 103 98* 98* 102 102 101  CO2 28 28 25  20* 21* 20*  GLUCOSE 75 95 129* 153* 107* 112*  BUN 27* 18 25* 19 17 21*  CREATININE 1.21 1.22 1.36* 1.29* 1.17 1.22  CALCIUM 9.0 9.3 9.1 9.2 9.2 8.7*  MG 2.3 2.2 2.4 2.3 2.5*  --   PHOS 4.7* 3.4 3.5 2.5 3.4  --    Liver Function Tests:  Recent Labs Lab 11/16/16 0732 11/17/16 0630  AST 28 32  ALT 26 40  ALKPHOS 63 61  BILITOT 1.0 1.2  PROT 7.0 6.9  ALBUMIN 3.5 3.4*   CBG:  Recent Labs Lab 11/13/16 1929 11/13/16 2330 11/14/16 0333 11/14/16 0808 11/14/16 1204  GLUCAP 111* 102* 92 143* 142*    Recent Results (from the past 240 hour(s))  MRSA PCR Screening  Status: None   Collection Time: 11/10/16  5:07 PM  Result Value Ref Range Status   MRSA by PCR NEGATIVE NEGATIVE Final    Comment:        The GeneXpert MRSA Assay (FDA approved for NASAL specimens only), is one component of a comprehensive MRSA colonization surveillance program. It is not intended to diagnose MRSA infection nor to guide or monitor treatment for MRSA infections.   Culture, Urine     Status: Abnormal (Preliminary result)   Collection Time: 11/16/16 12:09 PM  Result Value Ref Range Status   Specimen Description URINE, RANDOM  Final   Special Requests NONE  Final   Culture (A)  Final    >=100,000 COLONIES/mL  GRAM NEGATIVE RODS CULTURE REINCUBATED FOR BETTER GROWTH    Report Status PENDING  Incomplete         Radiology Studies: Ct Abdomen Pelvis Wo Contrast  Result Date: 11/16/2016 CLINICAL DATA:  Abdominal pain.  Painful urination. EXAM: CT ABDOMEN AND PELVIS WITHOUT CONTRAST TECHNIQUE: Multidetector CT imaging of the abdomen and pelvis was performed following the standard protocol without IV contrast. COMPARISON:  Contrast-enhanced CT 03/05/2014 FINDINGS: Lower chest: Cardiomegaly with coronary artery calcifications. Small pericardial fluid/ effusion, stable. Linear atelectasis in both lower lobes. No confluent consolidation. No pleural fluid. Hepatobiliary: No focal hepatic lesion allowing for lack contrast. Gallbladder physiologically distended, no calcified stone. No biliary dilatation. Pancreas: Parenchymal atrophy. No ductal dilatation or inflammation. Pancreas is not well-defined given lack of IV contrast and paucity of intra-abdominal fat. Spleen: Normal in size without focal abnormality. Adrenals/Urinary Tract: No adrenal nodule. Cortical scarring in the mid lower right kidney at site of prior infarct. Chronic scarring in the inferior left kidney. Left renal cyst was better defined on prior contrast-enhanced exam. No hydronephrosis or perinephric edema. Urinary bladder is distended but thick walled. Question of perivesicular edema. Stomach/Bowel: Stomach is decompressed. Bowel evaluation slight limited by patient motion. No evidence of bowel wall thickening or obstruction. Enteric contrast reaches the distal small bowel. Appendix is not visualized, no pericecal inflammation. Moderate colonic stool. Vascular/Lymphatic: Aortic and bi-iliac atherosclerosis. Left external iliac stent. No aneurysm. No evidence of adenopathy allowing for lack of IV contrast. Reproductive: Prominent prostate gland spanning 5.5 cm causing mass effect on the bladder base. Other: No free air, free fluid, or intra-abdominal  fluid collection. Musculoskeletal: There are no acute or suspicious osseous abnormalities. Stable Modic endplate changes at L5-S1 with endplate sclerosis and vacuum phenomenon. IMPRESSION: 1. Mild bladder wall thickening with questionable perivesicular edema, may be urinary tract infection or seen with chronic bladder outlet obstruction, prostate gland appears enlarged. 2. Otherwise no acute abnormality in the abdomen/pelvis. 3. Aorta bi-iliac atherosclerosis. Electronically Signed   By: Rubye Oaks M.D.   On: 11/16/2016 16:50        Scheduled Meds: . atorvastatin  40 mg Oral q1800  . clonazePAM  0.5 mg Oral BID  . feeding supplement  1 Container Oral TID BM  . furosemide  40 mg Oral Daily  . ipratropium-albuterol  3 mL Nebulization BID  . pantoprazole  40 mg Oral BID  . rivaroxaban  15 mg Oral Daily   Continuous Infusions: . sodium chloride    . cefTRIAXone (ROCEPHIN)  IV Stopped (11/17/16 1623)     LOS: 8 days     Erby Sanderson, MD, FACP, FHM. [facility] Pager [phone]  If 7PM-7AM, please contact night-coverage [email or web address] Password [facility] 11/18/2016, 3:08 PM

## 2016-11-19 LAB — BASIC METABOLIC PANEL
Anion gap: 8 (ref 5–15)
BUN: 23 mg/dL — ABNORMAL HIGH (ref 6–20)
CHLORIDE: 99 mmol/L — AB (ref 101–111)
CO2: 25 mmol/L (ref 22–32)
CREATININE: 1.32 mg/dL — AB (ref 0.61–1.24)
Calcium: 8.9 mg/dL (ref 8.9–10.3)
GFR calc non Af Amer: 56 mL/min — ABNORMAL LOW (ref >60)
Glucose, Bld: 165 mg/dL — ABNORMAL HIGH (ref 65–99)
Potassium: 3.8 mmol/L (ref 3.5–5.1)
Sodium: 132 mmol/L — ABNORMAL LOW (ref 135–145)

## 2016-11-19 LAB — URINE CULTURE: Culture: >=100000 — AB

## 2016-11-19 MED ORDER — CEPHALEXIN 500 MG PO CAPS: 500.0000 mg | ORAL_CAPSULE | Freq: Three times a day (TID) | ORAL | 0 refills | Status: AC

## 2016-11-19 MED ORDER — FUROSEMIDE 40 MG PO TABS: 40.0000 mg | ORAL_TABLET | Freq: Every day | ORAL | 0 refills | Status: AC

## 2016-11-19 MED ORDER — RIVAROXABAN 15 MG PO TABS: 15.0000 mg | ORAL_TABLET | Freq: Every day | ORAL | 0 refills | Status: DC

## 2016-11-19 MED ORDER — IPRATROPIUM-ALBUTEROL 0.5-2.5 (3) MG/3ML IN SOLN: 3.0000 mL | Freq: Four times a day (QID) | RESPIRATORY_TRACT | Status: DC | PRN

## 2016-11-19 NOTE — Progress Notes (Signed)
Pharmacy called and stated okay to give pt rocephin early and they will retime dose  Pt refusing skilled facility, social worker Huntley DecSara aware Case manager aware   [name]

## 2016-11-19 NOTE — Progress Notes (Signed)
MD stated to discharge pt once he receives his fourth dose of IV antibiotics   [name]

## 2016-11-19 NOTE — Progress Notes (Signed)
Pharmacy came to bedside to discuss pt xarelto  Pt refused assistive devices, he states he has them at home, case manager aware  [name]

## 2016-11-19 NOTE — Care Management Note (Signed)
Case Management Note  Patient Details  Name: [name] MRN: [id number] Date of Birth: 12/01/1950  Action/Plan: Patient is refusing to go to SNF at this time and is requesting to return home; [facility]; LenoraMary with [facility] called for arrangements; DME - 3:1 and rolling walker ordered also; patient stated that he knows where to go for substance abuse outpatient.   Expected Discharge Date:    11/19/2016              Expected Discharge Plan:  Home w Home Health Services  Discharge planning Services  CM Consult  Choice offered to:  Patient:     HH Arranged:  RN, Disease Management, PT [facility]  Status of Service:  In process, will continue to follow  Martin MosherChandler, Martin Pompei L, RN,MHA,BSN [phone] 11/19/2016, 11:20 AM

## 2016-11-19 NOTE — Discharge Summary (Signed)
Physician Discharge Summary  [name] HCW:[id number] DOB: [birth year]  PCP: [facility]  Admit date: 11/10/2016 Discharge date: 11/19/2016  Recommendations for Outpatient Follow-up:  1. [facility]/PCP in 5 days with repeat labs (CBC & BMP).  Home Health: PT and RN Equipment/Devices: None    Discharge Condition: Improved and stable  CODE STATUS: Full  Diet recommendation: Heart healthy diet.  Discharge Diagnoses:  Active Problems:   Polysubstance abuse   Essential hypertension   Acute respiratory failure with hypoxia (HCC)   Chronic systolic congestive heart failure (HCC)   Flash pulmonary edema (HCC)   Hypokalemia   Acute on chronic systolic heart failure (HCC)   Atrial fibrillation (HCC)   Substance abuse   Hypertension   Acute on chronic heart failure (HCC)   Ventilator dependent (HCC)   Acute encephalopathy   AKI (acute kidney injury) ([place])   Hyperglycemia   Acute metabolic encephalopathy   Hyponatremia   Abdominal pain   Acute lower UTI   Severe protein-calorie malnutrition (HCC)   Hyperkalemia   Brief Summary: 66 year old male with PMH of COPD, chronic systolic CHF (LVEF 17%), polysubstance abuse (cocaine & tobacco), A. fib, CAD, HTN, CVA presented to ED with EMS complaining of severe dyspnea. Upon arrival to ED, patient requesting to be intubated as he felt he was suffering. Moments later he became minimally responsive and was immediately intubated. ABG 7.26/59/123. Recent admission 3/1 and 3/9 with acute respiratory failure in the setting of pulmonary edema requiring intubation and during both those admissions it was documented that he tested positive for cocaine. He was admitted to ICU by CCM. Extubated 11/13/16 and transferred to [facility] service on 11/15/16.   Assessment & Plan:   1. Acute respiratory failure with hypoxia: Secondary to decompensated CHF/flash pulmonary edema. Extubated 11/13/16.  Resolved. 2. Acute on chronic systolic CHF/ischemic cardiomyopathy: In the setting of cocaine abuse. Diuresed initially with IV Lasix. -3.5 L since admission. Now transitioned to oral Lasix. Improved and stable. Blood pressures are soft limiting resumption of lisinopril. Close follow-up as outpatient and consider resuming lisinopril as blood pressure allows or may consider BiDil. 2-D echo showed LVEF 20-25 percent. May consider outpatient consultation with cardiology. However, compliance seems to be an issue in his care. 3. Acute encephalopathy: In the setting of sedation. Resolved. Neurology was consulted and felt low suspicion for seizures. Interim and continuous video EEG without seizure like activity. Neurology signed off.  4. Paroxysmal A. fib: Currently in sinus rhythm. Heparin infusion transitioned to Xarelto per pharmacy. Was on carvedilol PTA. Current soft blood pressures limit restarting lisinopril or carvedilol and moreover reluctant to start beta blockers due to ongoing cocaine abuse. Cannot use Cardizem due to low EF. Xarelto does reduced and adjusted to renal functions. 5. Hypokalemia/hyperkalemia: Potassium now normal at 4.5. Follow BMP closely as outpatient. 6. COPD: No clinical bronchospasm. Tobacco cessation counseled. 7. Tobacco abuse: Cessation counseled. 8. Acute kidney injury: Attributed to aggressive IV diuresis. Creatinine had peaked to 1.36. This had decreased to 1.22 but is back up to 1.3, likely related to Lasix. Continue current dose of Lasix and monitor BMP as outpatient closely. 9. Polysubstance abuse (tobacco & cocaine): Cessation counseled. 10. Essential hypertension: Running soft blood pressures as indicated below. Asymptomatic of these blood pressures. 11. Hyponatremia: Stable. 12. Abdominal pain/Escherichia coli and Klebsiella acute cystitis: Patient had UTI symptoms, positive urine microscopy on 11/16/16 and culture and CT abdomen suggests cystitis. Completed 4 days of IV  ceftriaxone  in the hospital. Discharged on oral Keflex to complete an additional 3 days. Abdominal pain resolved. 13. Severe protein calorie malnutrition: Management per dietitian recommendations.   Consultants:  CCM Neurology   Procedures:  Intubated 7/3 and self extubated 7/6.  EEG on 11/13/16  Impression: This sedatedEEG is abnormal due to moderatediffuse slowing of the background.  Clinical Correlation of the above findings indicates diffuse cerebral dysfunction that is non-specific in etiology and can be seen with hypoxic/ischemic injury, toxic/metabolic encephalopathies, or medication effect from Precedex and Fentanyl. The absence of epileptiform discharges does not rule out a clinical diagnosis of epilepsy. Clinical correlation is advised.  VIDEO EEGfrom 11/13/16 ->11/14/16  Interpretation: This is a normal continuous EEG. No epileptiform discharges, focal or lateralizing signs are seen. There is no unequivocal evidence of encephalopathy  2-D echo 11/12/16: Study Conclusions  - Left ventricle: The cavity size was normal. Wall thickness was increased in a pattern of mild LVH. Systolic function was severely reduced. The estimated ejection fraction was in the range of 20% to 25%.   Discharge Instructions  Discharge Instructions    (HEART FAILURE PATIENTS) Call MD:  Anytime you have any of the following symptoms: 1) 3 pound weight gain in 24 hours or 5 pounds in 1 week 2) shortness of breath, with or without a dry hacking cough 3) swelling in the hands, feet or stomach 4) if you have to sleep on extra pillows at night in order to breathe.    Complete by:  As directed    Call MD for:  difficulty breathing, headache or visual disturbances    Complete by:  As directed    Call MD for:  extreme fatigue    Complete by:  As directed    Call MD for:  persistant dizziness or light-headedness    Complete by:  As directed    Call MD for:  severe uncontrolled pain     Complete by:  As directed    Call MD for:  temperature >100.4    Complete by:  As directed    Diet - low sodium heart healthy    Complete by:  As directed    Increase activity slowly    Complete by:  As directed        Medication List    STOP taking these medications   carvedilol 3.125 MG tablet Commonly known as:  COREG   lisinopril 2.5 MG tablet Commonly known as:  PRINIVIL,ZESTRIL   potassium chloride 10 MEQ tablet Commonly known as:  K-DUR     TAKE these medications   atorvastatin 40 MG tablet Commonly known as:  LIPITOR Take 1 tablet (40 mg total) by mouth daily at 6 PM.   cephALEXin 500 MG capsule Commonly known as:  KEFLEX Take 1 capsule (500 mg total) by mouth 3 (three) times daily.   furosemide 40 MG tablet Commonly known as:  LASIX Take 1 tablet (40 mg total) by mouth daily.   ipratropium-albuterol 0.5-2.5 (3) MG/3ML Soln Commonly known as:  DUONEB Take 3 mLs by nebulization every 6 (six) hours as needed (for shortness of breath or wheezing).   Rivaroxaban 15 MG Tabs tablet Commonly known as:  XARELTO Take 1 tablet (15 mg total) by mouth daily. What changed:  medication strength  how much to take  when to take this   VENTOLIN HFA 108 (90 Base) MCG/ACT inhaler Generic drug:  albuterol Inhale 1 puff into the lungs every 6 (six) hours as needed for wheezing or shortness  of breath. What changed:  Another medication with the same name was removed. Continue taking this medication, and follow the directions you see here.       Contact information for follow-up providers    [facility], [facility].   Specialty:  [place] Why:  They will do your home health care at your home Contact information: [address] [place] [address] [place], [facility]. Schedule an appointment as soon as possible for a visit in 5 day(s).   Why:  To be seen with repeat labs (CBC & BMP). Contact  information: [address] [place] [address] [phone]            Contact information for after-discharge care    Destination    [facility] .   Specialty:  Skilled Nursing Facility Contact information: [place] [phone]                 No Known Allergies    Procedures/Studies: Ct Abdomen Pelvis Wo Contrast  Result Date: 11/16/2016 CLINICAL DATA:  Abdominal pain.  Painful urination. EXAM: CT ABDOMEN AND PELVIS WITHOUT CONTRAST TECHNIQUE: Multidetector CT imaging of the abdomen and pelvis was performed following the standard protocol without IV contrast. COMPARISON:  Contrast-enhanced CT 03/05/2014 FINDINGS: Lower chest: Cardiomegaly with coronary artery calcifications. Small pericardial fluid/ effusion, stable. Linear atelectasis in both lower lobes. No confluent consolidation. No pleural fluid. Hepatobiliary: No focal hepatic lesion allowing for lack contrast. Gallbladder physiologically distended, no calcified stone. No biliary dilatation. Pancreas: Parenchymal atrophy. No ductal dilatation or inflammation. Pancreas is not well-defined given lack of IV contrast and paucity of intra-abdominal fat. Spleen: Normal in size without focal abnormality. Adrenals/Urinary Tract: No adrenal nodule. Cortical scarring in the mid lower right kidney at site of prior infarct. Chronic scarring in the inferior left kidney. Left renal cyst was better defined on prior contrast-enhanced exam. No hydronephrosis or perinephric edema. Urinary bladder is distended but thick walled. Question of perivesicular edema. Stomach/Bowel: Stomach is decompressed. Bowel evaluation slight limited by patient motion. No evidence of bowel wall thickening or obstruction. Enteric contrast reaches the distal small bowel. Appendix is not visualized, no pericecal inflammation. Moderate colonic stool. Vascular/Lymphatic: Aortic and bi-iliac atherosclerosis. Left  external iliac stent. No aneurysm. No evidence of adenopathy allowing for lack of IV contrast. Reproductive: Prominent prostate gland spanning 5.5 cm causing mass effect on the bladder base. Other: No free air, free fluid, or intra-abdominal fluid collection. Musculoskeletal: There are no acute or suspicious osseous abnormalities. Stable Modic endplate changes at Q6-P6 with endplate sclerosis and vacuum phenomenon. IMPRESSION: 1. Mild bladder wall thickening with questionable perivesicular edema, may be urinary tract infection or seen with chronic bladder outlet obstruction, prostate gland appears enlarged. 2. Otherwise no acute abnormality in the abdomen/pelvis. 3. Aorta bi-iliac atherosclerosis. Electronically Signed   By: Jeb Levering M.D.   On: 11/16/2016 16:50   Ct Head Wo Contrast  Result Date: 11/13/2016 CLINICAL DATA:  Acute encephalopathy EXAM: CT HEAD WITHOUT CONTRAST TECHNIQUE: Contiguous axial images were obtained from the base of the skull through the vertex without intravenous contrast. COMPARISON:  Head CT 10/04/2015 FINDINGS: Brain: No mass lesion, intraparenchymal hemorrhage or extra-axial collection. No evidence of acute cortical infarct. There is an old right parieto-occipital-temporal infarct with associated encephalomalacia. Unchanged ex vacuo dilatation of the right lateral ventricle. The region of encephalomalacia is greater  than on the prior study. There is periventricular hypoattenuation compatible with chronic microvascular disease. Vascular: No hyperdense vessel or unexpected calcification. Skull: Normal visualized skull base, calvarium and extracranial soft tissues. Sinuses/Orbits: No sinus fluid levels or advanced mucosal thickening. No mastoid effusion. Normal orbits. IMPRESSION: 1. No acute intracranial abnormality. 2. Old infarcts of the posterior right MCA territory and right PCA territory with associated encephalomalacia and superimposed chronic microvascular ischemia.  Electronically Signed   By: Ulyses Jarred M.D.   On: 11/13/2016 01:26   Dg Chest Port 1 View  Result Date: 11/13/2016 CLINICAL DATA:  Endotracheal tube placement. Orogastric tube placement. Initial encounter. EXAM: PORTABLE CHEST 1 VIEW COMPARISON:  Chest radiograph performed 11/11/2016 FINDINGS: The patient's endotracheal tube is seen ending 6-7 cm above the carina. An enteric tube is noted extending below the diaphragm. The lungs are hyperexpanded, with flattening of the hemidiaphragms compatible with COPD. Chronic peribronchial thickening is noted. No pleural effusion or pneumothorax is seen. The cardiomediastinal silhouette is normal in size. No acute osseous abnormalities are identified. IMPRESSION: 1. Endotracheal tube seen ending 6-7 cm above the carina. This could be advanced 3-4 cm. 2. Enteric tube noted extending below the diaphragm. 3. Findings of COPD. Chronic peribronchial thickening noted. No acute focal airspace consolidation seen. Electronically Signed   By: Garald Balding M.D.   On: 11/13/2016 00:39   Dg Chest Port 1 View  Result Date: 11/11/2016 CLINICAL DATA:  Ventilator dependent EXAM: PORTABLE CHEST 1 VIEW COMPARISON:  11/10/2016 FINDINGS: Endotracheal tube and NG tube are unchanged. Cardiomegaly. Diffuse interstitial prominence compatible with edema/CHF. This has improved since prior study. No visible effusions. IMPRESSION: Continued CHF pattern, improved since prior study. Electronically Signed   By: Rolm Baptise M.D.   On: 11/11/2016 07:23   Dg Chest Port 1 View  Result Date: 11/10/2016 CLINICAL DATA:  Endotracheal tube placement. EXAM: PORTABLE CHEST 1 VIEW COMPARISON:  10/08/2016 FINDINGS: Endotracheal tube terminates 6.7 cm above carina.Nasogastric tube extends beyond the inferior aspect of the film. Midline trachea. Mild cardiomegaly. Prominence of the pulmonary arteries. No pleural fluid. No pneumothorax. Hyperinflation. Moderate interstitial edema. Thickening of the right minor  fissure. No lobar consolidation. IMPRESSION: Moderate congestive heart failure. Appropriate position of endotracheal tube. Suspect pulmonary artery enlargement, as can be seen with pulmonary arterial hypertension. Electronically Signed   By: Abigail Miyamoto M.D.   On: 11/10/2016 13:20   Dg Abd Portable 1v  Result Date: 11/13/2016 CLINICAL DATA:  Orogastric tube placement.  Initial encounter. EXAM: PORTABLE ABDOMEN - 1 VIEW COMPARISON:  CT of the abdomen and pelvis performed 03/05/2014 FINDINGS: The patient's enteric tube is noted ending overlying the body of the stomach. The visualized bowel gas pattern is grossly unremarkable. The lung bases are not well characterized due to technique. No free intra-abdominal air is seen, though evaluation for free air is limited on a single supine view. No acute osseous abnormalities are seen. IMPRESSION: Enteric tube noted ending overlying the body of the stomach. Electronically Signed   By: Garald Balding M.D.   On: 11/13/2016 00:40      Subjective: Seen this morning. Denied complaints. Anxious and insisting on going home. No chest pain, dyspnea, cough, palpitations, dizziness or lightheadedness reported. No further abdominal pain, dysuria or urinary frequency. Denies fever or chills. Yesterday had consented to going to SNF for rehabilitation but today declines SNF and insisting on going home. He states that he is worried that he will lose his house if he goes to SNF. Clinical social  worker and case management have met and discussed with patient.  Discharge Exam:  Vitals:   11/18/16 2143 11/19/16 0732 11/19/16 0847 11/19/16 1207  BP: (!) 85/56 103/66  94/69  Pulse: 86 85  88  Resp: '18 17  17  ' Temp: 98.2 F (36.8 C) 98.8 F (37.1 C)  98 F (36.7 C)  TempSrc: Oral Oral  Oral  SpO2: (!) 9% 100% 99% 100%  Weight:  55.1 kg (121 lb 8 oz)    Height:        General exam: Pleasant middle-aged male, small built and thinly nourished, lying comfortably propped up in  bed. Respiratory system: Clear to auscultation. Respiratory effort normal. Cardiovascular system: S1 & S2 heard, RRR. No JVD, murmurs, rubs, gallops or clicks. No pedal edema. Telemetry: Sinus rhythm with BBB morphology. Gastrointestinal system: Abdomen is nondistended, soft and nontender. No organomegaly or masses felt. Normal bowel sounds heard. Central nervous system: Alert and oriented. No focal neurological deficits. Extremities: Symmetric 5 x 5 power. Skin: No rashes, lesions or ulcers Psychiatry: Judgement and insight appear normal. Mood & affect appropriate.     The results of significant diagnostics from this hospitalization (including imaging, microbiology, ancillary and laboratory) are listed below for reference.     Microbiology: Recent Results (from the past 240 hour(s))  MRSA PCR Screening     Status: None   Collection Time: 11/10/16  5:07 PM  Result Value Ref Range Status   MRSA by PCR NEGATIVE NEGATIVE Final    Comment:        The GeneXpert MRSA Assay (FDA approved for NASAL specimens only), is one component of a comprehensive MRSA colonization surveillance program. It is not intended to diagnose MRSA infection nor to guide or monitor treatment for MRSA infections.   Culture, Urine     Status: Abnormal   Collection Time: 11/16/16 12:09 PM  Result Value Ref Range Status   Specimen Description URINE, RANDOM  Final   Special Requests NONE  Final   Culture (A)  Final    >=100,000 COLONIES/mL KLEBSIELLA PNEUMONIAE >=100,000 COLONIES/mL ESCHERICHIA COLI    Report Status 11/19/2016 FINAL  Final   Organism ID, Bacteria KLEBSIELLA PNEUMONIAE (A)  Final   Organism ID, Bacteria ESCHERICHIA COLI (A)  Final      Susceptibility   Escherichia coli - MIC*    AMPICILLIN >=32 RESISTANT Resistant     CEFAZOLIN <=4 SENSITIVE Sensitive     CEFTRIAXONE <=1 SENSITIVE Sensitive     CIPROFLOXACIN <=0.25 SENSITIVE Sensitive     GENTAMICIN <=1 SENSITIVE Sensitive     IMIPENEM  <=0.25 SENSITIVE Sensitive     NITROFURANTOIN <=16 SENSITIVE Sensitive     TRIMETH/SULFA <=20 SENSITIVE Sensitive     AMPICILLIN/SULBACTAM 16 INTERMEDIATE Intermediate     PIP/TAZO <=4 SENSITIVE Sensitive     Extended ESBL NEGATIVE Sensitive     * >=100,000 COLONIES/mL ESCHERICHIA COLI   Klebsiella pneumoniae - MIC*    AMPICILLIN RESISTANT Resistant     CEFAZOLIN <=4 SENSITIVE Sensitive     CEFTRIAXONE <=1 SENSITIVE Sensitive     CIPROFLOXACIN <=0.25 SENSITIVE Sensitive     GENTAMICIN <=1 SENSITIVE Sensitive     IMIPENEM <=0.25 SENSITIVE Sensitive     NITROFURANTOIN 64 INTERMEDIATE Intermediate     TRIMETH/SULFA <=20 SENSITIVE Sensitive     AMPICILLIN/SULBACTAM 4 SENSITIVE Sensitive     PIP/TAZO <=4 SENSITIVE Sensitive     Extended ESBL NEGATIVE Sensitive     * >=100,000 COLONIES/mL KLEBSIELLA PNEUMONIAE  Labs: CBC:  Recent Labs Lab 11/13/16 0214 11/14/16 0605 11/15/16 0209 11/16/16 0732 11/17/16 0630  WBC 8.5 9.9 9.4 10.3 11.0*  NEUTROABS  --   --   --  7.4 7.3  HGB 13.3 14.5 13.4 13.9 15.1  HCT 41.6 45.8 41.6 42.8 44.5  MCV 86.8 88.4 87.0 86.8 85.1  PLT 169 149* 167 191 586   Basic Metabolic Panel:  Recent Labs Lab 11/13/16 0214 11/14/16 0605 11/15/16 0209 11/16/16 0732 11/17/16 0630 11/18/16 0720 11/19/16 0421  NA 141 138 134* 133* 133* 131* 132*  K 3.3* 3.4* 3.3* 4.4 5.2* 4.5 3.8  CL 103 98* 98* 102 102 101 99*  CO2 '28 28 25 ' 20* 21* 20* 25  GLUCOSE 75 95 129* 153* 107* 112* 165*  BUN 27* 18 25* 19 17 21* 23*  CREATININE 1.21 1.22 1.36* 1.29* 1.17 1.22 1.32*  CALCIUM 9.0 9.3 9.1 9.2 9.2 8.7* 8.9  MG 2.3 2.2 2.4 2.3 2.5*  --   --   PHOS 4.7* 3.4 3.5 2.5 3.4  --   --    Liver Function Tests:  Recent Labs Lab 11/16/16 0732 11/17/16 0630  AST 28 32  ALT 26 40  ALKPHOS 63 61  BILITOT 1.0 1.2  PROT 7.0 6.9  ALBUMIN 3.5 3.4*   BNP (last 3 results)  Recent Labs  10/03/16 0738 10/08/16 2021 11/10/16 1326  BNP 2,670.0* 1,325.0* 1,932.3*    CBG:  Recent Labs Lab 11/13/16 1929 11/13/16 2330 11/14/16 0333 11/14/16 0808 11/14/16 1204  GLUCAP 111* 102* 92 143* 142*   Urinalysis    Component Value Date/Time   COLORURINE YELLOW 11/16/2016 1209   APPEARANCEUR CLOUDY (A) 11/16/2016 1209   LABSPEC 1.009 11/16/2016 1209   PHURINE 6.0 11/16/2016 1209   GLUCOSEU NEGATIVE 11/16/2016 1209   HGBUR MODERATE (A) 11/16/2016 1209   BILIRUBINUR NEGATIVE 11/16/2016 1209   KETONESUR NEGATIVE 11/16/2016 1209   PROTEINUR 100 (A) 11/16/2016 1209   UROBILINOGEN 1.0 07/06/2011 0221   NITRITE NEGATIVE 11/16/2016 1209   LEUKOCYTESUR LARGE (A) 11/16/2016 1209      Time coordinating discharge: Over 30 minutes  SIGNED:  Vernell Leep, MD, FACP, FHM. [facility] Pager [phone]  If 7PM-7AM, please contact night-coverage [email or web address] Password [facility] 11/19/2016, 12:49 PM

## 2016-11-19 NOTE — Clinical Social Work Note (Signed)
Patient has decided he wants to return home with home health. He reports that he feels safe doing so. SNF, RN, RNCM, and MD notified. Patient will discharge home today.  CSW signing off.  Charlynn CourtSarah Willam Munford, CSW [phone]

## 2016-11-19 NOTE — Discharge Instructions (Addendum)
Please get your medications reviewed and adjusted by your Primary MD. ° °Please request your Primary MD to go over all Hospital Tests and Procedure/Radiological results at the follow up, please get all Hospital records sent to your Prim MD by signing hospital release before you go home. ° °If you had Pneumonia of Lung problems at the Hospital: °Please get a 2 view Chest X ray done in 6-8 weeks after hospital discharge or sooner if instructed by your Primary MD. ° °If you have Congestive Heart Failure: °Please call your Cardiologist or Primary MD anytime you have any of the following symptoms:  °1) 3 pound weight gain in 24 hours or 5 pounds in 1 week  °2) shortness of breath, with or without a dry hacking cough  °3) swelling in the hands, feet or stomach  °4) if you have to sleep on extra pillows at night in order to breathe ° °Follow cardiac low salt diet and 1.5 lit/day fluid restriction. ° °If you have diabetes °Accuchecks 4 times/day, Once in AM empty stomach and then before each meal. °Log in all results and show them to your primary doctor at your next visit. °If any glucose reading is under 80 or above 300 call your primary MD immediately. ° °If you have Seizure/Convulsions/Epilepsy: °Please do not drive, operate heavy machinery, participate in activities at heights or participate in high speed sports until you have seen by Primary MD or a Neurologist and advised to do so again. ° °If you had Gastrointestinal Bleeding: °Please ask your Primary MD to check a complete blood count within one week of discharge or at your next visit. Your endoscopic/colonoscopic biopsies that are pending at the time of discharge, will also need to followed by your Primary MD. ° °Get Medicines reviewed and adjusted. °Please take all your medications with you for your next visit with your Primary MD ° °Please request your Primary MD to go over all hospital tests and procedure/radiological results at the follow up, please ask your  Primary MD to get all Hospital records sent to his/her office. ° °If you experience worsening of your admission symptoms, develop shortness of breath, life threatening emergency, suicidal or homicidal thoughts you must seek medical attention immediately by calling 911 or calling your MD immediately  if symptoms less severe. ° °You must read complete instructions/literature along with all the possible adverse reactions/side effects for all the Medicines you take and that have been prescribed to you. Take any new Medicines after you have completely understood and accpet all the possible adverse reactions/side effects.  ° °Do not drive or operate heavy machinery when taking Pain medications.  ° °Do not take more than prescribed Pain, Sleep and Anxiety Medications ° °Special Instructions: If you have smoked or chewed Tobacco  in the last 2 yrs please stop smoking, stop any regular Alcohol  and or any Recreational drug use. ° °Wear Seat belts while driving. ° °Please note °You were cared for by a hospitalist during your hospital stay. If you have any questions about your discharge medications or the care you received while you were in the hospital after you are discharged, you can call the unit and asked to speak with the hospitalist on call if the hospitalist that took care of you is not available. Once you are discharged, your primary care physician will handle any further medical issues. Please note that NO REFILLS for any discharge medications will be authorized once you are discharged, as it is imperative that you   return to your primary care physician (or establish a relationship with a primary care physician if you do not have one) for your aftercare needs so that they can reassess your need for medications and monitor your lab values.  You can reach the hospitalist office at phone [phone] or fax [phone]   If you do not have a primary care physician, you can call [phone] for a physician  referral.   =============================================================================  Information on my medicine - XARELTO (Rivaroxaban)  This medication education was reviewed with me or my healthcare representative as part of my discharge preparation.    Why was Xarelto prescribed for you? Xarelto was prescribed for you to reduce the risk of a blood clot forming that can cause a stroke if you have a medical condition called atrial fibrillation (a type of irregular heartbeat).  What do you need to know about xarelto ? Take your Xarelto ONCE DAILY at the same time every day with your evening meal. If you have difficulty swallowing the tablet whole, you may crush it and mix in applesauce just prior to taking your dose.  Take Xarelto exactly as prescribed by your doctor and DO NOT stop taking Xarelto without talking to the doctor who prescribed the medication.  Stopping without other stroke prevention medication to take the place of Xarelto may increase your risk of developing a clot that causes a stroke.  Refill your prescription before you run out.  After discharge, you should have regular check-up appointments with your healthcare provider that is prescribing your Xarelto.  In the future your dose may need to be changed if your kidney function or weight changes by a significant amount.  What do you do if you miss a dose? If you are taking Xarelto ONCE DAILY and you miss a dose, take it as soon as you remember on the same day then continue your regularly scheduled once daily regimen the next day. Do not take two doses of Xarelto at the same time or on the same day.   Important Safety Information A possible side effect of Xarelto is bleeding. You should call your healthcare provider right away if you experience any of the following: ? Bleeding from an injury or your nose that does not stop. ? Unusual colored urine (red or dark brown) or unusual colored stools (red or  black). ? Unusual bruising for unknown reasons. ? A serious fall or if you hit your head (even if there is no bleeding).  Some medicines may interact with Xarelto and might increase your risk of bleeding while on Xarelto. To help avoid this, consult your healthcare provider or pharmacist prior to using any new prescription or non-prescription medications, including herbals, vitamins, non-steroidal anti-inflammatory drugs (NSAIDs) and supplements.  This website has more information on Xarelto: [email or web address].

## 2016-11-19 NOTE — Progress Notes (Signed)
Pt expressed concern with paying his rent on time. Pt stated he gets late fees daily. Informed pt that when discharged MD can provide a letter. Informed pt that RN can call social worker to provide resources. Pt refused.   Cendy Oconnor Elige [place]on[place]ley

## 2016-11-19 NOTE — Progress Notes (Signed)
Pt IV discontinued, catheter intact and telemetry removed. Pt discharge education provided at bedside. [facility] at bedside. Pt has all belongings. Pt has bus pass. Pt aware he will receive a phone call with MD follow up appointment. Pt aware of where to pick up his medications. Pt discharged via wheelchair to bus stop  Randy Castrejon [place]

## 2016-11-19 NOTE — Progress Notes (Signed)
Pt states he has no xarelto at home, informed MD Pt states he needs a bus pass upon discharge, informed social worker  Dillan Candela

## 2016-12-02 ENCOUNTER — Inpatient Hospital Stay (INDEPENDENT_AMBULATORY_CARE_PROVIDER_SITE_OTHER): Payer: Medicare Other | Admitting: Physician Assistant

## 2016-12-22 ENCOUNTER — Inpatient Hospital Stay (INDEPENDENT_AMBULATORY_CARE_PROVIDER_SITE_OTHER): Payer: Medicare Other | Admitting: Physician Assistant

## 2017-01-03 ENCOUNTER — Emergency Department (HOSPITAL_COMMUNITY): Payer: Medicare Other

## 2017-01-03 ENCOUNTER — Inpatient Hospital Stay (HOSPITAL_COMMUNITY)
Admission: EM | Admit: 2017-01-03 | Discharge: 2017-01-05 | DRG: 291 | Disposition: A | Discharge status: Home / Self Care | Payer: Medicare Other | Attending: Family Medicine | Admitting: Family Medicine

## 2017-01-03 DIAGNOSIS — J441 Chronic obstructive pulmonary disease with (acute) exacerbation: Secondary | ICD-10-CM | POA: Diagnosis present

## 2017-01-03 DIAGNOSIS — Z96652 Presence of left artificial knee joint: Secondary | ICD-10-CM | POA: Diagnosis present

## 2017-01-03 DIAGNOSIS — I10 Essential (primary) hypertension: Secondary | ICD-10-CM | POA: Diagnosis present

## 2017-01-03 DIAGNOSIS — J9601 Acute respiratory failure with hypoxia: Secondary | ICD-10-CM | POA: Diagnosis present

## 2017-01-03 DIAGNOSIS — I110 Hypertensive heart disease with heart failure: Principal | ICD-10-CM | POA: Diagnosis present

## 2017-01-03 DIAGNOSIS — I4891 Unspecified atrial fibrillation: Secondary | ICD-10-CM | POA: Diagnosis present

## 2017-01-03 DIAGNOSIS — Z79899 Other long term (current) drug therapy: Secondary | ICD-10-CM

## 2017-01-03 DIAGNOSIS — Z87891 Personal history of nicotine dependence: Secondary | ICD-10-CM

## 2017-01-03 DIAGNOSIS — Z8673 Personal history of transient ischemic attack (TIA), and cerebral infarction without residual deficits: Secondary | ICD-10-CM

## 2017-01-03 DIAGNOSIS — I5023 Acute on chronic systolic (congestive) heart failure: Secondary | ICD-10-CM | POA: Diagnosis present

## 2017-01-03 DIAGNOSIS — N179 Acute kidney failure, unspecified: Secondary | ICD-10-CM | POA: Diagnosis present

## 2017-01-03 DIAGNOSIS — Z7901 Long term (current) use of anticoagulants: Secondary | ICD-10-CM

## 2017-01-03 DIAGNOSIS — I2510 Atherosclerotic heart disease of native coronary artery without angina pectoris: Secondary | ICD-10-CM | POA: Diagnosis present

## 2017-01-03 DIAGNOSIS — F1910 Other psychoactive substance abuse, uncomplicated: Secondary | ICD-10-CM | POA: Diagnosis present

## 2017-01-03 LAB — I-STAT TROPONIN, ED: Troponin i, poc: 0.01 ng/mL (ref 0.00–0.08)

## 2017-01-03 MED ORDER — IPRATROPIUM BROMIDE 0.02 % IN SOLN
1.0000 mg | Freq: Once | RESPIRATORY_TRACT | Status: AC
  Administered 2017-01-04: 1 mg via RESPIRATORY_TRACT
  Filled 2017-01-03: qty 5

## 2017-01-03 MED ORDER — ALBUTEROL (5 MG/ML) CONTINUOUS INHALATION SOLN
15.0000 mg/h | INHALATION_SOLUTION | Freq: Once | RESPIRATORY_TRACT | Status: AC
  Administered 2017-01-04: 15 mg/h via RESPIRATORY_TRACT
  Filled 2017-01-03: qty 120

## 2017-01-03 NOTE — ED Provider Notes (Signed)
TIME SEEN: 11:36 PM  CHIEF COMPLAINT: shortness of breath  HPI: Patient is a 66 year old male with history of atrial fibrillation on Xarelto, nonobstructive coronary artery disease, CHF, COPD, cyst abuse, hypertension who presents to the emergency department with shortness of breath that started about an hour prior to calling EMS. EMS states that he was speaking short sentences but oxygen saturations were 100% on room air. Patient reports he quit smoking a month ago. He does not wear oxygen at home. He is having diffuse chest tightness. No lower extremity swelling or pain. No fever but does have a cough with white sputum production. Has been intubated before for respiratory failure. Received 125 mg of IV Solu-Medrol with EMS, 10 mg of albuterol and 0.5 mg of Atrovent. He feels like his symptoms are improving.  ROS: See HPI Constitutional: no fever  Eyes: no drainage  ENT: no runny nose   Cardiovascular:   chest pain  Resp:  SOB  GI: no vomiting GU: no dysuria Integumentary: no rash  Allergy: no hives  Musculoskeletal: no leg swelling  Neurological: no slurred speech ROS otherwise negative  PAST MEDICAL HISTORY/PAST SURGICAL HISTORY:  Past Medical History:  Diagnosis Date  . Atrial fibrillation (HCC)   . CAD (coronary artery disease)    nonobstructive  . CHF (congestive heart failure) (HCC)   . Chronic systolic heart failure (HCC)   . COPD (chronic obstructive pulmonary disease) (HCC)   . Drug abuse    cocaine  . Heartburn   . Hypertension   . Stroke (HCC)   . Substance abuse     MEDICATIONS:  Prior to Admission medications   Medication Sig Start Date End Date Taking? Authorizing Provider  atorvastatin (LIPITOR) 40 MG tablet Take 1 tablet (40 mg total) by mouth daily at 6 PM. 07/13/16   Arrien, York Ram, MD  furosemide (LASIX) 40 MG tablet Take 1 tablet (40 mg total) by mouth daily. 11/19/16   Hongalgi, Maximino Greenland, MD  ipratropium-albuterol (DUONEB) 0.5-2.5 (3) MG/3ML SOLN  Take 3 mLs by nebulization every 6 (six) hours as needed (for shortness of breath or wheezing). 11/19/16   Hongalgi, Maximino Greenland, MD  rivaroxaban (XARELTO) 15 MG TABS tablet Take 1 tablet (15 mg total) by mouth daily. 11/20/16   Hongalgi, Maximino Greenland, MD  VENTOLIN HFA 108 [phone] Base) MCG/ACT inhaler Inhale 1 puff into the lungs every 6 (six) hours as needed for wheezing or shortness of breath.  10/06/16   [provider]    ALLERGIES:  No Known Allergies  SOCIAL HISTORY:  Social History  Substance Use Topics  . Smoking status: Former Smoker    Types: Cigarettes    Quit date: 09/03/2016  . Smokeless tobacco: Never Used  . Alcohol use Yes    FAMILY HISTORY: Family History  Problem Relation Age of Onset  . Malignant hyperthermia Mother     EXAM: Pulse (!) 113   Temp (!) 97.1 F (36.2 C) (Axillary)   Resp (!) 26   SpO2 100%  CONSTITUTIONAL: Alert and oriented and responds appropriately to questions. chronically ill-appearing, in moderate respiratory distress HEAD: Normocephalic EYES: Conjunctivae clear, pupils appear equal, EOMI ENT: normal nose; moist mucous membranes NECK: Supple, no meningismus, no nuchal rigidity, no LAD  CARD: regular and tachycardic; S1 and S2 appreciated; no murmurs, no clicks, no rubs, no gallops RESP: patient is very tachypneic, speaking short sentences, diminished aeration diffusely with scattered expiratory wheezes, no rhonchi or rales appreciated, no hypoxia in moderate respiratory distress ABD/GI:  Normal bowel sounds; non-distended; soft, non-tender, no rebound, no guarding, no peritoneal signs, no hepatosplenomegaly BACK:  The back appears normal and is non-tender to palpation, there is no CVA tenderness EXT: Normal ROM in all joints; non-tender to palpation; no edema; normal capillary refill; no cyanosis, no calf tenderness or swelling    SKIN: Normal color for age and race; warm; no rash NEURO: Moves all extremities equally PSYCH: The patient's mood  and manner are appropriate. Grooming and personal hygiene are appropriate.  MEDICAL DECISION MAKING: Patient here with respiratory distress. Has history of COPD and CHF. Has been intubated before for respiratory failure. He does have increased work of breathing at this time. I feel he will benefit from BiPAP. We'll continue albuterol treatments. Anticipate admission. Will obtain labs, chest x-ray.  ED PROGRESS: 12:30 AM  Pt improving on BiPAP. His respiratory rate has significantly improved. His chest x-ray shows COPD changes but also mild CHF. We'll give IV Lasix. Troponin is negative. ABG pending.  1:20 AM  Pt's ABG is reassuring. We'll discuss with medicine for admission. He is doing well on BiPAP.  1:59 AM Discussed patient's case with hospitalist, Dr. Julian Reil.  I have recommended admission and patient (and family if present) agree with this plan. Admitting physician will place admission orders.   I reviewed all nursing notes, vitals, pertinent previous records, EKGs, lab and urine results, imaging (as available).   EKG Interpretation  Date/Time:  Sunday January 03 2017 23:35:51 EDT Ventricular Rate:  109 PR Interval:    QRS Duration: 169 QT Interval:  394 QTC Calculation: 531 R Axis:   118 Text Interpretation:  Sinus tachycardia RBBB and LPFB No significant change since last tracing Confirmed by Briah Nary, Baxter Hire [phone]) on 01/03/2017 11:39:31 PM        CRITICAL CARE Performed by: Raelyn Number   Total critical care time: 45 minutes  Critical care time was exclusive of separately billable procedures and treating other patients.  Critical care was necessary to treat or prevent imminent or life-threatening deterioration.  Critical care was time spent personally by me on the following activities: development of treatment plan with patient and/or surrogate as well as nursing, discussions with consultants, evaluation of patient's response to treatment, examination of patient,  obtaining history from patient or surrogate, ordering and performing treatments and interventions, ordering and review of laboratory studies, ordering and review of radiographic studies, pulse oximetry and re-evaluation of patient's condition.    Bridney Guadarrama, Layla Maw, DO 01/04/17 [id number]

## 2017-01-03 NOTE — ED Triage Notes (Signed)
Pt BIB [facility] for shortness of breath. Pt states he ran out of his inhalers and has a hx of COPD. Pt has received 10mg  of albuterol 0.5mg  of atrovent, and 125mg  solumedrol. Pt has audible wheezing

## 2017-01-04 ENCOUNTER — Encounter (HOSPITAL_COMMUNITY): Payer: Self-pay

## 2017-01-04 DIAGNOSIS — I5023 Acute on chronic systolic (congestive) heart failure: Secondary | ICD-10-CM

## 2017-01-04 DIAGNOSIS — N179 Acute kidney failure, unspecified: Secondary | ICD-10-CM | POA: Diagnosis present

## 2017-01-04 DIAGNOSIS — J441 Chronic obstructive pulmonary disease with (acute) exacerbation: Secondary | ICD-10-CM

## 2017-01-04 DIAGNOSIS — Z8673 Personal history of transient ischemic attack (TIA), and cerebral infarction without residual deficits: Secondary | ICD-10-CM | POA: Diagnosis not present

## 2017-01-04 DIAGNOSIS — I10 Essential (primary) hypertension: Secondary | ICD-10-CM

## 2017-01-04 DIAGNOSIS — Z87891 Personal history of nicotine dependence: Secondary | ICD-10-CM | POA: Diagnosis not present

## 2017-01-04 DIAGNOSIS — Z96652 Presence of left artificial knee joint: Secondary | ICD-10-CM | POA: Diagnosis present

## 2017-01-04 DIAGNOSIS — I110 Hypertensive heart disease with heart failure: Secondary | ICD-10-CM | POA: Diagnosis present

## 2017-01-04 DIAGNOSIS — Z7901 Long term (current) use of anticoagulants: Secondary | ICD-10-CM | POA: Diagnosis not present

## 2017-01-04 DIAGNOSIS — J9601 Acute respiratory failure with hypoxia: Secondary | ICD-10-CM

## 2017-01-04 DIAGNOSIS — F1910 Other psychoactive substance abuse, uncomplicated: Secondary | ICD-10-CM | POA: Diagnosis not present

## 2017-01-04 DIAGNOSIS — I4891 Unspecified atrial fibrillation: Secondary | ICD-10-CM | POA: Diagnosis present

## 2017-01-04 DIAGNOSIS — I2510 Atherosclerotic heart disease of native coronary artery without angina pectoris: Secondary | ICD-10-CM | POA: Diagnosis present

## 2017-01-04 DIAGNOSIS — Z79899 Other long term (current) drug therapy: Secondary | ICD-10-CM | POA: Diagnosis not present

## 2017-01-04 LAB — I-STAT ARTERIAL BLOOD GAS, ED
BICARBONATE: 23.5 mmol/L (ref 20.0–28.0)
O2 SAT: 99 %
Patient temperature: 97.8
TCO2: 25 mmol/L (ref 22–32)
pCO2 arterial: 33.7 mmHg (ref 32.0–48.0)
pH, Arterial: 7.449 (ref 7.350–7.450)
pO2, Arterial: 114 mmHg — ABNORMAL HIGH (ref 83.0–108.0)

## 2017-01-04 LAB — CBC WITH DIFFERENTIAL/PLATELET
Basophils Absolute: 0 10*3/uL (ref 0.0–0.1)
Basophils Relative: 0 %
EOS PCT: 2 %
Eosinophils Absolute: 0.1 10*3/uL (ref 0.0–0.7)
HCT: 40.6 % (ref 39.0–52.0)
Hemoglobin: 13 g/dL (ref 13.0–17.0)
LYMPHS ABS: 3.4 10*3/uL (ref 0.7–4.0)
LYMPHS PCT: 50 %
MCH: 27.8 pg (ref 26.0–34.0)
MCHC: 32 g/dL (ref 30.0–36.0)
MCV: 86.9 fL (ref 78.0–100.0)
MONOS PCT: 9 %
Monocytes Absolute: 0.6 10*3/uL (ref 0.1–1.0)
Neutro Abs: 2.6 10*3/uL (ref 1.7–7.7)
Neutrophils Relative %: 39 %
PLATELETS: 226 10*3/uL (ref 150–400)
RBC: 4.67 MIL/uL (ref 4.22–5.81)
RDW: 15.6 % — AB (ref 11.5–15.5)
WBC: 6.8 10*3/uL (ref 4.0–10.5)

## 2017-01-04 LAB — BASIC METABOLIC PANEL
Anion gap: 6 (ref 5–15)
BUN: 15 mg/dL (ref 6–20)
CALCIUM: 8.6 mg/dL — AB (ref 8.9–10.3)
CO2: 25 mmol/L (ref 22–32)
Chloride: 106 mmol/L (ref 101–111)
Creatinine, Ser: 1.55 mg/dL — ABNORMAL HIGH (ref 0.61–1.24)
GFR calc Af Amer: 53 mL/min — ABNORMAL LOW (ref >60)
GFR, EST NON AFRICAN AMERICAN: 46 mL/min — AB (ref >60)
GLUCOSE: 112 mg/dL — AB (ref 65–99)
Potassium: 4.4 mmol/L (ref 3.5–5.1)
Sodium: 137 mmol/L (ref 135–145)

## 2017-01-04 LAB — BRAIN NATRIURETIC PEPTIDE: B Natriuretic Peptide: 1131.5 pg/mL — ABNORMAL HIGH (ref 0.0–100.0)

## 2017-01-04 MED ORDER — IPRATROPIUM BROMIDE 0.02 % IN SOLN
0.5000 mg | Freq: Four times a day (QID) | RESPIRATORY_TRACT | Status: DC
  Administered 2017-01-04 – 2017-01-05 (×3): 0.5 mg via RESPIRATORY_TRACT
  Filled 2017-01-04 (×3): qty 2.5

## 2017-01-04 MED ORDER — SODIUM CHLORIDE 0.9 % IV SOLN: 250.0000 mL | INTRAVENOUS | Status: DC | PRN

## 2017-01-04 MED ORDER — FUROSEMIDE 10 MG/ML IJ SOLN
40.0000 mg | Freq: Two times a day (BID) | INTRAMUSCULAR | Status: DC
  Administered 2017-01-04 (×2): 40 mg via INTRAVENOUS
  Filled 2017-01-04 (×2): qty 4

## 2017-01-04 MED ORDER — FUROSEMIDE 10 MG/ML IJ SOLN
40.0000 mg | Freq: Once | INTRAMUSCULAR | Status: AC
  Administered 2017-01-04: 40 mg via INTRAVENOUS
  Filled 2017-01-04: qty 4

## 2017-01-04 MED ORDER — ONDANSETRON HCL 4 MG/2ML IJ SOLN: 4.0000 mg | Freq: Four times a day (QID) | INTRAMUSCULAR | Status: DC | PRN

## 2017-01-04 MED ORDER — AZITHROMYCIN 250 MG PO TABS
500.0000 mg | ORAL_TABLET | Freq: Once | ORAL | Status: AC
Start: 2017-01-04 — End: 2017-01-04
  Administered 2017-01-04: 500 mg via ORAL
  Filled 2017-01-04: qty 2

## 2017-01-04 MED ORDER — ACETAMINOPHEN 325 MG PO TABS: 650.0000 mg | ORAL_TABLET | ORAL | Status: DC | PRN

## 2017-01-04 MED ORDER — ALBUTEROL SULFATE HFA 108 (90 BASE) MCG/ACT IN AERS: 1.0000 | INHALATION_SPRAY | Freq: Four times a day (QID) | RESPIRATORY_TRACT | Status: DC | PRN

## 2017-01-04 MED ORDER — ALBUTEROL SULFATE (2.5 MG/3ML) 0.083% IN NEBU
2.5000 mg | INHALATION_SOLUTION | RESPIRATORY_TRACT | Status: DC | PRN
Start: 2017-01-04 — End: 2017-01-05
  Filled 2017-01-04: qty 3

## 2017-01-04 MED ORDER — FUROSEMIDE 40 MG PO TABS
40.0000 mg | ORAL_TABLET | Freq: Two times a day (BID) | ORAL | Status: DC
  Administered 2017-01-05 (×2): 40 mg via ORAL
  Filled 2017-01-04 (×2): qty 1

## 2017-01-04 MED ORDER — AZITHROMYCIN 250 MG PO TABS
250.0000 mg | ORAL_TABLET | Freq: Every day | ORAL | Status: DC
  Administered 2017-01-05: 250 mg via ORAL
  Filled 2017-01-04: qty 1

## 2017-01-04 MED ORDER — SODIUM CHLORIDE 0.9% FLUSH: 3.0000 mL | INTRAVENOUS | Status: DC | PRN

## 2017-01-04 MED ORDER — RIVAROXABAN 20 MG PO TABS
20.0000 mg | ORAL_TABLET | Freq: Every day | ORAL | Status: DC
  Administered 2017-01-04 – 2017-01-05 (×2): 20 mg via ORAL
  Filled 2017-01-04 (×3): qty 1

## 2017-01-04 MED ORDER — SODIUM CHLORIDE 0.9% FLUSH
3.0000 mL | Freq: Two times a day (BID) | INTRAVENOUS | Status: DC
  Administered 2017-01-04 – 2017-01-05 (×3): 3 mL via INTRAVENOUS

## 2017-01-04 MED ORDER — PREDNISONE 20 MG PO TABS
40.0000 mg | ORAL_TABLET | Freq: Every day | ORAL | Status: DC
  Administered 2017-01-04 – 2017-01-05 (×2): 40 mg via ORAL
  Filled 2017-01-04 (×2): qty 2

## 2017-01-04 NOTE — ED Notes (Signed)
Pt given water to drink per RN 

## 2017-01-04 NOTE — ED Notes (Signed)
Attempted report to 3W x1. °

## 2017-01-04 NOTE — Progress Notes (Signed)
PROGRESS NOTE    [name]  WNI:[id number] DOB: Nov 10, 1950 DOA: 01/03/2017 PCP: [facility] (Confirm with patient/family/NH records and if not entered, this HAS to be entered at [facility] point of entry. "No PCP" if truly none.)   Brief Narrative: (Start on day 1 of progress note - keep it brief and live) [name] is [name] 66 y.o. male with medical history significant of CHF with EF 20-25%, COPD, cocaine abuse.  He notes he ran out of his inhalers.  Admitted with COPD exacerbation and HF exacerbation.  Initially on bipap, now satting well on room air.     Assessment & Plan:   Principal Problem:   Acute respiratory failure with hypoxia (HCC) Active Problems:   Essential hypertension   Acute on chronic systolic heart failure (HCC)   COPD exacerbation (HCC)   1. Acute resp failure with hypoxia - multifactorial, COPD exacerbation and CHF decompensation 1. Off bipap, ctm 2. Acute on chronic systolic CHF - 1. Lasix 40mg  IV BID -> PO BID (twice home dose) 2. CHF pathway 3. BMP daily 4. Tele monitor 5. Just had echo with EF 20-25% last month so will hold off (was also 20-25% in 2012) 6. Notes he hasn't used cocaine in [place] few weeks.  Has been hanging out with ppl who do.  7. Told patient he really needed to make follow up appointment with cards 1. If patient is complaint, possible candidate for AICD, but if he doesn't make follow up appointments, doubt they will due this inpatient during an acute decompensation. 3. COPD exacerbation - 1. COPD pathway 2. Prednisone 3. Adult wheeze protocol 4. Scheduled Atrovent 5. Azithromycin (though I think its more set off by CHF than new infection)   DVT prophylaxis: xarelto Code Status: full  Family Communication: (none Disposition Plan: possibly tomorrow   Consultants:   none  Procedures: (Don't include imaging studies which can be auto populated. Include things that cannot be auto populated i.e. Echo,  Carotid and venous dopplers, Foley, Bipap, HD, tubes/drains, wound vac, central lines etc)  BIPAP  Antimicrobials: (specify start and planned stop date. Auto populated tables are space occupying and do not give end dates)  azithromycin    Subjective: Says he ran out of his iinhaler.  Currently No CP, SOB.    Objective: Vitals:   01/04/17 1215 01/04/17 1246 01/04/17 1704 01/04/17 2037  BP: 114/82 113/64 107/71 105/75  Pulse: 92 96 85 60  Resp: 12 (!) 26 20 19   Temp:   98.1 F (36.7 C) 98.2 F (36.8 C)  TempSrc:   Oral Oral  SpO2: 99% 96% 95% 98%  Height:  6\' 1"  (1.854 m)      Intake/Output Summary (Last 24 hours) at 01/04/17 2201 Last data filed at 01/04/17 1900  Gross per 24 hour  Intake              600 ml  Output             1750 ml  Net            -1150 ml   There were no vitals filed for this visit.  Examination:  General exam: Appears calm and comfortable. Thin, chronically ill appearing  Respiratory system: Clear to auscultation. Respiratory effort normal. Cardiovascular system: S1 & S2 heard, RRR. No JVD, murmurs, rubs, gallops or clicks. No pedal edema. Gastrointestinal system: Abdomen is nondistended, soft and nontender. No organomegaly or masses felt. Normal bowel sounds heard.  Central nervous system: Alert and oriented. No focal neurological deficits. Extremities: Symmetric 5 x 5 power. Skin: No rashes, lesions or ulcers Psychiatry: Judgement and insight appear normal. Mood & affect appropriate.     Data Reviewed: I have personally reviewed following labs and imaging studies  CBC:  Recent Labs Lab 01/03/17 2340  WBC 6.8  NEUTROABS 2.6  HGB 13.0  HCT 40.6  MCV 86.9  PLT 226   Basic Metabolic Panel:  Recent Labs Lab 01/03/17 2340  NA 137  K 4.4  CL 106  CO2 25  GLUCOSE 112*  BUN 15  CREATININE 1.55*  CALCIUM 8.6*   GFR: CrCl cannot be calculated (Unknown ideal weight.). Liver Function Tests: No results for input(s): AST, ALT,  ALKPHOS, BILITOT, PROT, ALBUMIN in the last 168 hours. No results for input(s): LIPASE, AMYLASE in the last 168 hours. No results for input(s): AMMONIA in the last 168 hours. Coagulation Profile: No results for input(s): INR, PROTIME in the last 168 hours. Cardiac Enzymes: No results for input(s): CKTOTAL, CKMB, CKMBINDEX, TROPONINI in the last 168 hours. BNP (last 3 results) No results for input(s): PROBNP in the last 8760 hours. HbA1C: No results for input(s): HGBA1C in the last 72 hours. CBG: No results for input(s): GLUCAP in the last 168 hours. Lipid Profile: No results for input(s): CHOL, HDL, LDLCALC, TRIG, CHOLHDL, LDLDIRECT in the last 72 hours. Thyroid Function Tests: No results for input(s): TSH, T4TOTAL, FREET4, T3FREE, THYROIDAB in the last 72 hours. Anemia Panel: No results for input(s): VITAMINB12, FOLATE, FERRITIN, TIBC, IRON, RETICCTPCT in the last 72 hours. Sepsis Labs: No results for input(s): PROCALCITON, LATICACIDVEN in the last 168 hours.  No results found for this or any previous visit (from the past 240 hour(s)).       Radiology Studies: Dg Chest Port 1 View  Result Date: 01/03/2017 CLINICAL DATA:  Wheezing and shortness breath. EXAM: PORTABLE CHEST 1 VIEW COMPARISON:  11/12/2016 FINDINGS: Mild cardiomegaly, new. Unchanged mediastinal contours. Emphysema with superimposed interstitial thickening suspicious for pulmonary edema. No confluent airspace disease. No large pleural effusion. No pneumothorax. Unchanged osseous structures. IMPRESSION: Mild CHF. Increased cardiomegaly. Interstitial thickening superimposed on emphysema, suspicious for pulmonary edema. Electronically Signed   By: Rubye Oaks M.D.   On: 01/03/2017 23:56        Scheduled Meds: . [START ON 01/05/2017] azithromycin  250 mg Oral Daily  . furosemide  40 mg Intravenous BID  . ipratropium  0.5 mg Nebulization QID  . predniSONE  40 mg Oral Q breakfast  . rivaroxaban  20 mg Oral Q  supper  . sodium chloride flush  3 mL Intravenous Q12H   Continuous Infusions: . sodium chloride       LOS: 0 days    Time spent: 35 min    Vannie Hochstetler Grier Mitts, MD [facility] [phone]   If 7PM-7AM, please contact night-coverage [email or web address] Password [email or web address] 01/04/2017, 10:01 PM

## 2017-01-04 NOTE — Progress Notes (Signed)
Trial off BIPAP, placed on 2l [place].  No distress noted at this time.

## 2017-01-04 NOTE — ED Notes (Signed)
Pt requested a break from BIPAP. Julian Reil, DO stated we could trial it

## 2017-01-04 NOTE — H&P (Signed)
History and Physical    [name] OQH:[id number] DOB: 08-Feb-1951 DOA: 01/03/2017  PCP: [facility]  Patient coming from: Home  I have personally briefly reviewed patient's old medical records in [facility]  Chief Complaint: SOB  HPI: [name] is a 66 y.o. male with medical history significant of CHF with EF 20-25%, COPD, cocaine abuse.  Patient presents to the ED at [facility] in respiratory distress.  Symptoms onset this evening, are severe, nothing makes better or worse.  Symptoms are constant.  He says he ran out of his inhalers at home.   ED Course: Got albuterol, solumedrol with EMS, atrovent, and is now stable, but on a BIPAP.  CXR suggests pulmonary edema.  Patient give lasix 40mg  IV.  Patient states he quit smoking and quit cocaine last month.   Review of Systems: As per HPI otherwise 10 point review of systems negative.   Past Medical History:  Diagnosis Date  . Atrial fibrillation (HCC)   . CAD (coronary artery disease)    nonobstructive  . CHF (congestive heart failure) (HCC)   . Chronic systolic heart failure (HCC)   . COPD (chronic obstructive pulmonary disease) (HCC)   . Drug abuse    cocaine  . Heartburn   . Hypertension   . Stroke (HCC)   . Substance abuse     Past Surgical History:  Procedure Laterality Date  . JOINT REPLACEMENT  L knee     reports that he quit smoking about 4 months ago. His smoking use included Cigarettes. He has never used smokeless tobacco. He reports that he drinks alcohol. He reports that he uses drugs, including Cocaine, about 1 time per week.  No Known Allergies  Family History  Problem Relation Age of Onset  . Malignant hyperthermia Mother      Prior to Admission medications   Medication Sig Start Date End Date Taking? Authorizing Provider  furosemide (LASIX) 40 MG tablet Take 1 tablet (40 mg total) by mouth daily. 11/19/16  Yes Hongalgi, Maximino Greenland, MD  VENTOLIN HFA 108 (90 Base)  MCG/ACT inhaler Inhale 1 puff into the lungs every 6 (six) hours as needed for wheezing or shortness of breath.  10/06/16  Yes [provider]  XARELTO 20 MG TABS tablet Take 20 mg by mouth daily. 12/22/16  Yes [provider]    Physical Exam: Vitals:   01/04/17 0001 01/04/17 0015 01/04/17 0030 01/04/17 0045  BP: (!) 138/106 133/80 (!) 120/101 (!) 129/93  Pulse: (!) 112 (!) 102 91 94  Resp:  (!) 27 (!) 29 (!) 30  Temp:      TempSrc:      SpO2:  97% 97% 99%    Constitutional: NAD, calm, comfortable Eyes: PERRL, lids and conjunctivae normal ENMT: Mucous membranes are moist. Posterior pharynx clear of any exudate or lesions.Normal dentition.  Neck: normal, supple, no masses, no thyromegaly Respiratory: Diffuse wheezing Cardiovascular: Regular rate and rhythm, no murmurs / rubs / gallops. No extremity edema. 2+ pedal pulses. No carotid bruits.  Abdomen: no tenderness, no masses palpated. No hepatosplenomegaly. Bowel sounds positive.  Musculoskeletal: no clubbing / cyanosis. No joint deformity upper and lower extremities. Good ROM, no contractures. Normal muscle tone.  Skin: no rashes, lesions, ulcers. No induration Neurologic: CN 2-12 grossly intact. Sensation intact, DTR normal. Strength 5/5 in all 4.  Psychiatric: Normal judgment and insight. Alert and oriented x 3. Normal mood.    Labs on Admission: I have  personally reviewed following labs and imaging studies  CBC:  Recent Labs Lab 01/03/17 2340  WBC 6.8  NEUTROABS 2.6  HGB 13.0  HCT 40.6  MCV 86.9  PLT 226   Basic Metabolic Panel:  Recent Labs Lab 01/03/17 2340  NA 137  K 4.4  CL 106  CO2 25  GLUCOSE 112*  BUN 15  CREATININE 1.55*  CALCIUM 8.6*   GFR: CrCl cannot be calculated (Unknown ideal weight.). Liver Function Tests: No results for input(s): AST, ALT, ALKPHOS, BILITOT, PROT, ALBUMIN in the last 168 hours. No results for input(s): LIPASE, AMYLASE in the last 168 hours. No results for  input(s): AMMONIA in the last 168 hours. Coagulation Profile: No results for input(s): INR, PROTIME in the last 168 hours. Cardiac Enzymes: No results for input(s): CKTOTAL, CKMB, CKMBINDEX, TROPONINI in the last 168 hours. BNP (last 3 results) No results for input(s): PROBNP in the last 8760 hours. HbA1C: No results for input(s): HGBA1C in the last 72 hours. CBG: No results for input(s): GLUCAP in the last 168 hours. Lipid Profile: No results for input(s): CHOL, HDL, LDLCALC, TRIG, CHOLHDL, LDLDIRECT in the last 72 hours. Thyroid Function Tests: No results for input(s): TSH, T4TOTAL, FREET4, T3FREE, THYROIDAB in the last 72 hours. Anemia Panel: No results for input(s): VITAMINB12, FOLATE, FERRITIN, TIBC, IRON, RETICCTPCT in the last 72 hours. Urine analysis:    Component Value Date/Time   COLORURINE YELLOW 11/16/2016 1209   APPEARANCEUR CLOUDY (A) 11/16/2016 1209   LABSPEC 1.009 11/16/2016 1209   PHURINE 6.0 11/16/2016 1209   GLUCOSEU NEGATIVE 11/16/2016 1209   HGBUR MODERATE (A) 11/16/2016 1209   BILIRUBINUR NEGATIVE 11/16/2016 1209   KETONESUR NEGATIVE 11/16/2016 1209   PROTEINUR 100 (A) 11/16/2016 1209   UROBILINOGEN 1.0 07/06/2011 0221   NITRITE NEGATIVE 11/16/2016 1209   LEUKOCYTESUR LARGE (A) 11/16/2016 1209    Radiological Exams on Admission: Dg Chest Port 1 View  Result Date: 01/03/2017 CLINICAL DATA:  Wheezing and shortness breath. EXAM: PORTABLE CHEST 1 VIEW COMPARISON:  11/12/2016 FINDINGS: Mild cardiomegaly, new. Unchanged mediastinal contours. Emphysema with superimposed interstitial thickening suspicious for pulmonary edema. No confluent airspace disease. No large pleural effusion. No pneumothorax. Unchanged osseous structures. IMPRESSION: Mild CHF. Increased cardiomegaly. Interstitial thickening superimposed on emphysema, suspicious for pulmonary edema. Electronically Signed   By: Rubye Oaks M.D.   On: 01/03/2017 23:56    EKG: Independently  reviewed.  Assessment/Plan Principal Problem:   Acute respiratory failure with hypoxia (HCC) Active Problems:   Essential hypertension   Acute on chronic systolic heart failure (HCC)   COPD exacerbation (HCC)    1. Acute resp failure with hypoxia - multifactorial, COPD exacerbation and CHF decompensation 1. Continue BIPAP 2. Acute on chronic systolic CHF - 1. Lasix 40mg  IV BID 2. CHF pathway 3. BMP daily 4. Tele monitor 5. Just had echo with EF 20-25% last month so will hold off (was also 20-25% in 2012) 6. Told patient if he hadnt already, he really needed to quit cocaine (he claims he has). 7. Told patient he really needed to make follow up appointment with cards 1. If patient is complaint, possible candidate for AICD, but if he doesn't make follow up appointments, doubt they will due this inpatient during an acute decompensation. 3. COPD exacerbation - 1. COPD pathway 2. Prednisone 3. Adult wheeze protocol 4. Scheduled Atrovent 5. Azithromycin (though I think its more set off by CHF than new infection)  DVT prophylaxis: Xarelto Code Status: Full Family Communication: No family  in room Disposition Plan: Home after admit Consults called: None Admission status: Admit to inpatient   Hillary Bow DO [facility] Pager [phone]  If 7AM-7PM, please contact day team taking care of patient [email or web address] Password [email or web address]  01/04/2017, 2:10 AM

## 2017-01-05 ENCOUNTER — Encounter (HOSPITAL_COMMUNITY): Payer: Self-pay | Admitting: *Deleted

## 2017-01-05 DIAGNOSIS — N179 Acute kidney failure, unspecified: Secondary | ICD-10-CM

## 2017-01-05 DIAGNOSIS — F1910 Other psychoactive substance abuse, uncomplicated: Secondary | ICD-10-CM

## 2017-01-05 LAB — BASIC METABOLIC PANEL
ANION GAP: 9 (ref 5–15)
BUN: 21 mg/dL — ABNORMAL HIGH (ref 6–20)
CO2: 26 mmol/L (ref 22–32)
Calcium: 8.9 mg/dL (ref 8.9–10.3)
Chloride: 102 mmol/L (ref 101–111)
Creatinine, Ser: 1.29 mg/dL — ABNORMAL HIGH (ref 0.61–1.24)
GFR calc Af Amer: >60 mL/min (ref >60)
GFR, EST NON AFRICAN AMERICAN: 57 mL/min — AB (ref >60)
GLUCOSE: 131 mg/dL — AB (ref 65–99)
POTASSIUM: 3.6 mmol/L (ref 3.5–5.1)
Sodium: 137 mmol/L (ref 135–145)

## 2017-01-05 LAB — MAGNESIUM: Magnesium: 2 mg/dL (ref 1.7–2.4)

## 2017-01-05 LAB — RAPID URINE DRUG SCREEN, HOSP PERFORMED
Amphetamines: NOT DETECTED
BARBITURATES: NOT DETECTED
BENZODIAZEPINES: NOT DETECTED
COCAINE: POSITIVE — AB
Opiates: NOT DETECTED
Tetrahydrocannabinol: NOT DETECTED

## 2017-01-05 MED ORDER — PREDNISONE 20 MG PO TABS: 40.0000 mg | ORAL_TABLET | Freq: Every day | ORAL | 0 refills | Status: DC

## 2017-01-05 MED ORDER — AZITHROMYCIN 250 MG PO TABS: ORAL_TABLET | ORAL | 0 refills | Status: AC

## 2017-01-05 MED ORDER — ENSURE ENLIVE PO LIQD: 237.0000 mL | Freq: Two times a day (BID) | ORAL | 12 refills | Status: AC

## 2017-01-05 MED ORDER — RIVAROXABAN 15 MG PO TABS: 15.0000 mg | ORAL_TABLET | Freq: Every day | ORAL | 0 refills | Status: AC

## 2017-01-05 MED ORDER — POTASSIUM CHLORIDE CRYS ER 20 MEQ PO TBCR
40.0000 meq | EXTENDED_RELEASE_TABLET | Freq: Once | ORAL | Status: AC
  Administered 2017-01-05: 40 meq via ORAL
  Filled 2017-01-05: qty 2

## 2017-01-05 MED ORDER — VENTOLIN HFA 108 (90 BASE) MCG/ACT IN AERS: 1.0000 | INHALATION_SPRAY | Freq: Four times a day (QID) | RESPIRATORY_TRACT | 0 refills | Status: DC | PRN

## 2017-01-05 MED ORDER — ENSURE ENLIVE PO LIQD: 237.0000 mL | Freq: Two times a day (BID) | ORAL | Status: DC

## 2017-01-05 MED ORDER — IPRATROPIUM BROMIDE 0.02 % IN SOLN
0.5000 mg | Freq: Three times a day (TID) | RESPIRATORY_TRACT | Status: DC
  Administered 2017-01-05: 0.5 mg via RESPIRATORY_TRACT
  Filled 2017-01-05: qty 2.5

## 2017-01-05 NOTE — Discharge Summary (Signed)
Physician Discharge Summary  HAJIME BRUNKHORST FXT:[id number] DOB: 09-20-1950 DOA: 01/03/2017  PCP: [facility]  Admit date: 01/03/2017 Discharge date: 01/05/2017  Time spent: 35 minutes  Recommendations for Outpatient Follow-up:  1. Follow up with PCP, follow up BMP (with recent AKI) (xarelto decreased to 15 mg daily given renal function) 2. Weight at discharge 125 lbs, restarted on lasix 40 mg daily, continue to monitor 3. Ensure follow up with cardiology for his heart failure.  Possible AICD candiate given EF, but complicated by polysubstance use.  Referral placed.  Not on ACE/BB due to hypotension in past and cocaine use, but consider trial of this in future.  4. Consider PFT's as outpatient, he never had any wheezing for me on exam, but noted his problems started after he ran out of inhaler.  D/c'd on azithro/pred.  Continue to counsel on smoking cessation. 5. Follow up utox, continue to encourage cessation, he's working with [employer] services     Discharge Diagnoses:  Principal Problem:   Acute respiratory failure with hypoxia (HCC) Active Problems:   Polysubstance abuse   Essential hypertension   Acute on chronic systolic heart failure (HCC)   COPD exacerbation (HCC)   AKI (acute kidney injury) (HCC)  Discharge Condition: stable  Diet recommendation: heart healthy  Filed Weights   01/05/17 0328  Weight: 56.9 kg (125 lb 6.4 oz)    History of present illness:  [name] 66 y.o.malewith medical history significant of CHF with EF 20-25%, COPD, cocaine abuse.  He notes he ran out of his inhalers.  Admitted with COPD exacerbation and HF exacerbation.  Initially on bipap, now satting well on room air. He initially received IV lasix then was transitioned to PO.  On hospital day 1 he was asymptomatic satting well on room air.  He was discharged with recommendations to complete course of steroids and azithro.  Resume lasix 40  mg daily.  Follow up with PCP and cardiology.   Hospital Course:  1. Acute resp failure with hypoxia - Thought 2/2 HF and possible COPD exacerbation, see below.  1. Off bipap, ctm 2. Acute on chronic systolic CHF - 1. Lasix 40mg  IV BID then discharged on daily lasix 2. Echo EF 20-25% 01/05/17  (was also 20-25% in 2012) no repeat echo ordered. 3. Notes he hasn't used cocaine in Kaelen Caughlin few weeks.  Has been hanging out with ppl who do.  4. Utox pending 5. Needs cardiology follow up as would be possible AICD candidate, but complicated by polysubstance abuse 3. COPD exacerbation  1. COPD pathway 2. Prednisone 3. Refilled his albuterol 4. Azithromycin (though I think its more set off by CHF than new infection)  Acute kidney injury: suggests hypervolemia given improvement with lasix.  Follow up as outpatient.  Baseline between ~1.2  Afib: continued xarelto, decreased to 15 mg due to renal function  Procedures: bipap  Consultations:  none  Discharge Exam: Vitals:   01/05/17 1226 01/05/17 1451  BP: 101/79   Pulse:    Resp:    Temp: 97.7 F (36.5 C)   SpO2: 100% 99%    General: NAD Cardiovascular: RRR, no mgr Respiratory: mild bibasilar crackles Abd; s/nt/nd Ext: no LEE CN: 2-12 intact  Discharge Instructions   Discharge Instructions    AMB referral to CHF clinic    Complete by:  As directed    Diet - low sodium heart healthy    Complete by:  As directed  Discharge instructions    Complete by:  As directed    I think your shortness of breath was related to your heart failure.  It may have also been related to your COPD.  Please continue the azithromycin and the prednisone for an additional 3 days (5 day total course).  I recommend you get Devaeh Amadi scale for your home.  Weigh yourself daily (you weigh 125 lbs now).  If you gain more than 2-3 lbs in 1 day, take your lasix twice Sherrica Niehaus day.  If you gain more than 5 lbs in 1 week take double your dose.  Please call your physician if you  need to change your dose.  Quitting smoking is extremely important.  Quitting cocaine is extremely important and if you don't stop it could kill you.   Please follow up with your PCP within the few weeks.    Please follow up with cardiology.  They should call to set up an appointment for you.   Heart Failure patients record your daily weight using the same scale at the same time of day    Complete by:  As directed    Increase activity slowly    Complete by:  As directed      Current Discharge Medication List    START taking these medications   Details  azithromycin (ZITHROMAX) 250 MG tablet Take 1 pill daily starting on 8/29. Qty: 3 each, Refills: 0    feeding supplement, ENSURE ENLIVE, (ENSURE ENLIVE) LIQD Take 237 mLs by mouth 2 (two) times daily between meals. Qty: 237 mL, Refills: 12    predniSONE (DELTASONE) 20 MG tablet Take 2 tablets (40 mg total) by mouth daily with breakfast. Qty: 3 tablet, Refills: 0      CONTINUE these medications which have CHANGED   Details  Rivaroxaban (XARELTO) 15 MG TABS tablet Take 1 tablet (15 mg total) by mouth daily. Qty: 30 tablet, Refills: 0    VENTOLIN HFA 108 (90 Base) MCG/ACT inhaler Inhale 1 puff into the lungs every 6 (six) hours as needed for wheezing or shortness of breath. Qty: 1 Inhaler, Refills: 0      CONTINUE these medications which have NOT CHANGED   Details  furosemide (LASIX) 40 MG tablet Take 1 tablet (40 mg total) by mouth daily. Qty: 30 tablet, Refills: 0       No Known Allergies Follow-up Information    [facility] Follow up.   Contact information: [address] [place] [address] [phone]            The results of significant diagnostics from this hospitalization (including imaging, microbiology, ancillary and laboratory) are listed below for reference.    Significant Diagnostic Studies: Dg Chest Port 1 View  Result Date: 01/03/2017 CLINICAL DATA:  Wheezing  and shortness breath. EXAM: PORTABLE CHEST 1 VIEW COMPARISON:  11/12/2016 FINDINGS: Mild cardiomegaly, new. Unchanged mediastinal contours. Emphysema with superimposed interstitial thickening suspicious for pulmonary edema. No confluent airspace disease. No large pleural effusion. No pneumothorax. Unchanged osseous structures. IMPRESSION: Mild CHF. Increased cardiomegaly. Interstitial thickening superimposed on emphysema, suspicious for pulmonary edema. Electronically Signed   By: Rubye Oaks M.D.   On: 01/03/2017 23:56    Microbiology: No results found for this or any previous visit (from the past 240 hour(s)).   Labs: Basic Metabolic Panel:  Recent Labs Lab 01/03/17 2340 01/05/17 0212 01/05/17 1400  NA 137 137  --   K 4.4 3.6  --  CL 106 102  --   CO2 25 26  --   GLUCOSE 112* 131*  --   BUN 15 21*  --   CREATININE 1.55* 1.29*  --   CALCIUM 8.6* 8.9  --   MG  --   --  2.0   Liver Function Tests: No results for input(s): AST, ALT, ALKPHOS, BILITOT, PROT, ALBUMIN in the last 168 hours. No results for input(s): LIPASE, AMYLASE in the last 168 hours. No results for input(s): AMMONIA in the last 168 hours. CBC:  Recent Labs Lab 01/03/17 2340  WBC 6.8  NEUTROABS 2.6  HGB 13.0  HCT 40.6  MCV 86.9  PLT 226   Cardiac Enzymes: No results for input(s): CKTOTAL, CKMB, CKMBINDEX, TROPONINI in the last 168 hours. BNP: BNP (last 3 results)  Recent Labs  10/08/16 2021 11/10/16 1326 01/03/17 2336  BNP 1,325.0* 1,932.3* 1,131.5*    ProBNP (last 3 results) No results for input(s): PROBNP in the last 8760 hours.  CBG: No results for input(s): GLUCAP in the last 168 hours.     Signed:  Jackelyne Sayer Grier Mitts MD.  [facility] 01/05/2017, 4:40 PM

## 2017-01-05 NOTE — Care Management Note (Signed)
Case Management Note  Patient Details  Name: NAM REISH MRN: [id number] Date of Birth: 1950-08-01  Subjective/Objective:  Pt presented for Acute Respiratory Failure. Initiated on IV Solumedrol and weaned to po prednisone. PTA- pt is independent and uses a cane. Pt has PCP at [facility] and he uses the bus for transportation. Pt uses [employer] [address]                   Action/Plan: CM did provide Staff RN with two bus passes for the patient once stable for d/c. Bus Passes in the shadow chart. No further needs from CM at this time.   Expected Discharge Date:                  Expected Discharge Plan:  Home/Self Care  In-House Referral:  NA  Discharge planning Services  CM Consult (Provided Bus pass for home. )  Post Acute Care Choice:  NA Choice offered to:  NA  DME Arranged:  N/A DME Agency:  NA  HH Arranged:  NA HH Agency:  NA  Status of Service:  Completed, signed off  If discussed at Long Length of Stay Meetings, dates discussed:    Additional Comments:  Gala Lewandowsky, RN 01/05/2017, 3:42 PM

## 2017-01-05 NOTE — Progress Notes (Signed)
The patient has been given discharge paper work including a new medication list and what to take today. He has prescriptions to pick up. He is discharging via bus with provided bus pass.   Sheppard Evens RN

## 2017-01-05 NOTE — Progress Notes (Signed)
Initial Nutrition Assessment  DOCUMENTATION CODES:   Underweight, Severe malnutrition in context of chronic illness  INTERVENTION:    Ensure Enlive PO BID, each supplement provides 350 kcal and 20 grams of protein  NUTRITION DIAGNOSIS:   Malnutrition (severe) related to chronic illness (COPD) as evidenced by severe depletion of muscle mass, severe depletion of body fat.  GOAL:   Patient will meet greater than or equal to 90% of their needs  MONITOR:   PO intake, Supplement acceptance, Labs, I & O's  REASON FOR ASSESSMENT:   Consult COPD Protocol  ASSESSMENT:   66 yo male with PMH of COPD, HTN, CHF, substance/drug abuse (tobacco, alcohol, cocaine), CAD, stroke, and a fib, who was admitted on 8/26 with SOB r/t COPD exacerbation and acute on chronic HF.  Patient reports poor appetite and decreased intake for a few days PTA. Now eating very well, consuming 100% of meals. He likes chocolate Ensure and agreed to drink BID between meals. Nutrition-Focused physical exam completed. Findings are severe fat depletion, severe muscle depletion, and no edema.  Labs and medications reviewed.  Diet Order:  Diet 2 gram sodium Room service appropriate? Yes; Fluid consistency: Thin  Skin:  Reviewed, no issues  Last BM:  8/26  Height:   Ht Readings from Last 1 Encounters:  01/04/17 6\' 1"  (1.854 m)    Weight:   Wt Readings from Last 1 Encounters:  01/05/17 125 lb 6.4 oz (56.9 kg)    Ideal Body Weight:  83.6 kg  BMI:  Body mass index is 16.54 kg/m.  Estimated Nutritional Needs:   Kcal:  >2000  Protein:  90-100 gm  Fluid:  2 L  EDUCATION NEEDS:   No education needs identified at this time  Joaquin Courts, RD, LDN, CNSC Pager [phone] After Hours Pager [phone]

## 2017-01-05 NOTE — Plan of Care (Signed)
Problem: Safety: Goal: Ability to remain free from injury will improve Outcome: Progressing Verbalizes understanding of need to utilize call light to call for assistance prior to ambulation

## 2017-01-19 ENCOUNTER — Emergency Department (HOSPITAL_COMMUNITY): Payer: Medicare Other

## 2017-01-19 ENCOUNTER — Encounter (HOSPITAL_COMMUNITY): Payer: Self-pay

## 2017-01-19 ENCOUNTER — Emergency Department (HOSPITAL_COMMUNITY)
Admission: EM | Admit: 2017-01-19 | Discharge: 2017-01-19 | Disposition: A | Discharge status: Home / Self Care | Payer: Medicare Other | Attending: Emergency Medicine | Admitting: Emergency Medicine

## 2017-01-19 DIAGNOSIS — Z8673 Personal history of transient ischemic attack (TIA), and cerebral infarction without residual deficits: Secondary | ICD-10-CM | POA: Insufficient documentation

## 2017-01-19 DIAGNOSIS — Z7901 Long term (current) use of anticoagulants: Secondary | ICD-10-CM | POA: Insufficient documentation

## 2017-01-19 DIAGNOSIS — I2510 Atherosclerotic heart disease of native coronary artery without angina pectoris: Secondary | ICD-10-CM | POA: Insufficient documentation

## 2017-01-19 DIAGNOSIS — I110 Hypertensive heart disease with heart failure: Secondary | ICD-10-CM | POA: Insufficient documentation

## 2017-01-19 DIAGNOSIS — R0602 Shortness of breath: Secondary | ICD-10-CM | POA: Insufficient documentation

## 2017-01-19 DIAGNOSIS — J441 Chronic obstructive pulmonary disease with (acute) exacerbation: Secondary | ICD-10-CM | POA: Diagnosis not present

## 2017-01-19 DIAGNOSIS — Z96652 Presence of left artificial knee joint: Secondary | ICD-10-CM | POA: Insufficient documentation

## 2017-01-19 DIAGNOSIS — I5022 Chronic systolic (congestive) heart failure: Secondary | ICD-10-CM | POA: Insufficient documentation

## 2017-01-19 DIAGNOSIS — R0789 Other chest pain: Secondary | ICD-10-CM | POA: Diagnosis present

## 2017-01-19 DIAGNOSIS — Z87891 Personal history of nicotine dependence: Secondary | ICD-10-CM | POA: Diagnosis not present

## 2017-01-19 LAB — CBC
HCT: 39.5 % (ref 39.0–52.0)
HEMOGLOBIN: 12.6 g/dL — AB (ref 13.0–17.0)
MCH: 27.1 pg (ref 26.0–34.0)
MCHC: 31.9 g/dL (ref 30.0–36.0)
MCV: 84.9 fL (ref 78.0–100.0)
PLATELETS: 177 10*3/uL (ref 150–400)
RBC: 4.65 MIL/uL (ref 4.22–5.81)
RDW: 15.4 % (ref 11.5–15.5)
WBC: 5.5 10*3/uL (ref 4.0–10.5)

## 2017-01-19 LAB — BASIC METABOLIC PANEL
ANION GAP: 6 (ref 5–15)
BUN: 15 mg/dL (ref 6–20)
CALCIUM: 8.9 mg/dL (ref 8.9–10.3)
CHLORIDE: 111 mmol/L (ref 101–111)
CO2: 24 mmol/L (ref 22–32)
Creatinine, Ser: 1.19 mg/dL (ref 0.61–1.24)
GFR calc non Af Amer: >60 mL/min (ref >60)
Glucose, Bld: 107 mg/dL — ABNORMAL HIGH (ref 65–99)
Potassium: 3.8 mmol/L (ref 3.5–5.1)
SODIUM: 141 mmol/L (ref 135–145)

## 2017-01-19 MED ORDER — IPRATROPIUM BROMIDE 0.02 % IN SOLN
RESPIRATORY_TRACT | Status: AC
  Administered 2017-01-19: 0.5 mg via RESPIRATORY_TRACT
  Filled 2017-01-19: qty 2.5

## 2017-01-19 MED ORDER — ALBUTEROL SULFATE (2.5 MG/3ML) 0.083% IN NEBU
INHALATION_SOLUTION | RESPIRATORY_TRACT | Status: AC
  Administered 2017-01-19: 5 mg via RESPIRATORY_TRACT
  Filled 2017-01-19: qty 6

## 2017-01-19 MED ORDER — ALBUTEROL SULFATE (2.5 MG/3ML) 0.083% IN NEBU
5.0000 mg | INHALATION_SOLUTION | Freq: Once | RESPIRATORY_TRACT | Status: AC
  Administered 2017-01-19: 5 mg via RESPIRATORY_TRACT

## 2017-01-19 MED ORDER — DEXAMETHASONE SODIUM PHOSPHATE 10 MG/ML IJ SOLN
10.0000 mg | Freq: Once | INTRAMUSCULAR | Status: AC
  Administered 2017-01-19: 10 mg via INTRAVENOUS
  Filled 2017-01-19: qty 1

## 2017-01-19 MED ORDER — PREDNISONE 20 MG PO TABS: 40.0000 mg | ORAL_TABLET | Freq: Every day | ORAL | 0 refills | Status: AC

## 2017-01-19 MED ORDER — ALBUTEROL SULFATE HFA 108 (90 BASE) MCG/ACT IN AERS: 2.0000 | INHALATION_SPRAY | RESPIRATORY_TRACT | 0 refills | Status: AC | PRN

## 2017-01-19 MED ORDER — IPRATROPIUM BROMIDE 0.02 % IN SOLN
0.5000 mg | Freq: Once | RESPIRATORY_TRACT | Status: AC
  Administered 2017-01-19: 0.5 mg via RESPIRATORY_TRACT

## 2017-01-19 MED ORDER — ALBUTEROL SULFATE HFA 108 (90 BASE) MCG/ACT IN AERS
2.0000 | INHALATION_SPRAY | Freq: Once | RESPIRATORY_TRACT | Status: AC
Start: 2017-01-19 — End: 2017-01-19
  Administered 2017-01-19: 2 via RESPIRATORY_TRACT
  Filled 2017-01-19: qty 6.7

## 2017-01-19 NOTE — ED Notes (Signed)
Second RN attempted IV stick x 2

## 2017-01-19 NOTE — ED Triage Notes (Signed)
Pt comes via [facility] for SOB and CP that started around 2 am, woke him up, substernal. RBBB on EKG. Hx of COPD and CHF, fine crackles and wheezing noted. PTA received 324 ASA, 10 mg albuterol, 1 mg Atrovent.

## 2017-01-19 NOTE — ED Provider Notes (Signed)
[facility]-EMERGENCY DEPT Provider Note   CSN: [id number] Arrival date & time: 01/19/17  0356     History   Chief Complaint Chief Complaint  Patient presents with  . Shortness of Breath  . Chest Pain    HPI [name] is a 66 y.o. male.  HPI  This is a 66 year old male with history of A. fib, coronary artery disease, CHF, COPD who presents with shortness of breath. Patient reports onset of shortness of breath 1.5 hours prior to arrival. Reports cough. No fevers. Denies leg swelling. Denies chest pain. History of the same similar with COPD exacerbations. Most recent evaluation was one month ago. He is not on home oxygen.  Past Medical History:  Diagnosis Date  . Atrial fibrillation (HCC)   . CAD (coronary artery disease)    nonobstructive  . CHF (congestive heart failure) (HCC)   . Chronic systolic heart failure (HCC)   . COPD (chronic obstructive pulmonary disease) (HCC)   . Drug abuse    cocaine  . Heartburn   . Hypertension   . Stroke (HCC)   . Substance abuse     Patient Active Problem List   Diagnosis Date Noted  . Severe protein-calorie malnutrition (HCC) 11/17/2016  . Hyperkalemia 11/17/2016  . Abdominal pain 11/16/2016  . Acute lower UTI 11/16/2016  . AKI (acute kidney injury) (HCC) 11/15/2016  . Hyperglycemia 11/15/2016  . Acute metabolic encephalopathy 11/15/2016  . Hyponatremia 11/15/2016  . Acute encephalopathy   . Acute on chronic heart failure (HCC) 11/10/2016  . Ventilator dependent (HCC)   . Substance abuse   . Heartburn   . Hypertension   . Pulmonary edema   . Respiratory failure (HCC)   . Acute respiratory failure with hypercapnia (HCC) 08/06/2016  . Atrial fibrillation (HCC) 07/13/2016  . Chronic bronchitis (HCC) 07/13/2016  . Respiratory failure (HCC) 07/09/2016  . Pneumothorax on right   . 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 COPD exacerbation (HCC)   . History of CVA (cerebrovascular accident)   . Acute on chronic  systolic heart failure (HCC) 07/08/2011  . Hypokalemia 07/07/2011  . Flash pulmonary edema (HCC) 07/06/2011  . 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 Abnormal EKG 07/06/2011  . Chronic systolic congestive heart failure (HCC) 05/05/2011  . Acute respiratory failure with hypoxia (HCC) 05/03/2011  . Polysubstance abuse 03/25/2010  . Essential hypertension 03/25/2010    Past Surgical History:  Procedure Laterality Date  . JOINT REPLACEMENT  L knee       Home Medications    Prior to Admission medications   Medication Sig Start Date End Date Taking? Authorizing Provider  albuterol (PROVENTIL HFA;VENTOLIN HFA) 108 (90 Base) MCG/ACT inhaler Inhale 2 puffs into the lungs every 4 (four) hours as needed for wheezing or shortness of breath. 01/19/17   Atwood Adcock, Mayer Masker, MD  azithromycin (ZITHROMAX) 250 MG tablet Take 1 pill daily starting on 8/29. 01/06/17   Nancy Nordmann., MD  feeding supplement, ENSURE ENLIVE, (ENSURE ENLIVE) LIQD Take 237 mLs by mouth 2 (two) times daily between meals. 01/05/17   Nancy Nordmann., MD  furosemide (LASIX) 40 MG tablet Take 1 tablet (40 mg total) by mouth daily. 11/19/16   Hongalgi, Maximino Greenland, MD  predniSONE (DELTASONE) 20 MG tablet Take 2 tablets (40 mg total) by mouth daily. 01/19/17   Deep Bonawitz, Mayer Masker, MD  Rivaroxaban (XARELTO) 15 MG TABS tablet Take 1 tablet (15 mg total) by mouth daily. 01/05/17   Nancy Nordmann.,  MD    Family History Family History  Problem Relation Age of Onset  . Malignant hyperthermia Mother     Social History Social History  Substance Use Topics  . Smoking status: Former Smoker    Types: Cigarettes    Quit date: 09/03/2016  . Smokeless tobacco: Never Used  . Alcohol use Yes     Allergies   Patient has no known allergies.   Review of Systems Review of Systems  Constitutional: Negative for fever.  Respiratory: Positive for cough and shortness of breath.   Cardiovascular: Negative for chest pain  and leg swelling.  Gastrointestinal: Negative for abdominal pain, nausea and vomiting.  Genitourinary: Negative for dysuria.  All other systems reviewed and are negative.    Physical Exam Updated Vital Signs BP (!) 130/96 (BP Location: Left Arm)   Pulse 89   Temp 97.6 F (36.4 C) (Oral)   Resp (!) 28   SpO2 98%   Physical Exam  Constitutional: He is oriented to person, place, and time. No distress.  Chronically ill-appearing  HENT:  Head: Normocephalic and atraumatic.  Cardiovascular: Normal rate, regular rhythm and normal heart sounds.   No murmur heard. Pulmonary/Chest: Effort normal. No respiratory distress. He has wheezes.  Fair movement, wheezes noted in the bilateral bases  Abdominal: Soft. Bowel sounds are normal. There is no tenderness. There is no rebound.  Musculoskeletal: He exhibits no edema.  Neurological: He is alert and oriented to person, place, and time.  Skin: Skin is warm and dry.  Psychiatric: He has a normal mood and affect.  Nursing note and vitals reviewed.    ED Treatments / Results  Labs (all labs ordered are listed, but only abnormal results are displayed) Labs Reviewed  BASIC METABOLIC PANEL - Abnormal; Notable for the following:       Result Value   Glucose, Bld 107 (*)    All other components within normal limits  CBC - Abnormal; Notable for the following:    Hemoglobin 12.6 (*)    All other components within normal limits  I-STAT TROPONIN, ED    EKG  EKG Interpretation  Date/Time:  Tuesday January 19 2017 04:13:56 EDT Ventricular Rate:  88 PR Interval:    QRS Duration: 174 QT Interval:  447 QTC Calculation: 541 R Axis:   130 Text Interpretation:  Sinus rhythm Multiform ventricular premature complexes RBBB and LPFB No significant change since last tracing Confirmed by Ross Marcus ([id number]) on 01/19/2017 7:35:21 AM       Radiology Dg Chest 2 View  Result Date: 01/19/2017 CLINICAL DATA:  Shortness of breath and chest  pain starting around 2 a.m. Substernal. Right bundle blanch block on EKG. History of COPD and CHF. Fine crackles and wheezes. EXAM: CHEST  2 VIEW COMPARISON:  01/03/2017 FINDINGS: Mild cardiac enlargement. Mild pulmonary vascular congestion. Diffuse interstitial pattern to the lungs likely representing interstitial edema. No focal consolidation. Central pulmonary arteries are prominent, possibly indicating arterial hypertension. Emphysematous changes in the lungs with fibrosis in the apices. Calcification of the aorta. IMPRESSION: Mild pulmonary vascular congestion with diffuse interstitial pattern to the lungs likely representing edema. Emphysematous changes with fibrosis in the lung apices. Aortic atherosclerosis. Electronically Signed   By: Burman Nieves M.D.   On: 01/19/2017 04:56    Procedures Procedures (including critical care time)  Medications Ordered in ED Medications  albuterol (PROVENTIL) (2.5 MG/3ML) 0.083% nebulizer solution 5 mg (5 mg Nebulization Given 01/19/17 0426)  ipratropium (ATROVENT) nebulizer solution 0.5 mg (0.5  mg Nebulization Given 01/19/17 0426)  dexamethasone (DECADRON) injection 10 mg (10 mg Intravenous Given 01/19/17 0551)  albuterol (PROVENTIL HFA;VENTOLIN HFA) 108 (90 Base) MCG/ACT inhaler 2 puff (2 puffs Inhalation Given 01/19/17 0721)     Initial Impression / Assessment and Plan / ED Course  I have reviewed the triage vital signs and the nursing notes.  Pertinent labs & imaging results that were available during my care of the patient were reviewed by me and considered in my medical decision making (see chart for details).     Patient presents with shortness of breath. Nontoxic on exam. No respiratory distress. Does have some wheezing. Clinically does not appear volume overloaded. Patient was given a duo neb and Decadron. Basic labwork obtained and reassuring. He does not have any chest pain. Screening EKG shows no signs of ischemia. Chest x-ray does show some  mild vascular congestion; however, this is similar to prior. He does not appear volume overloaded at this time. He reports much improvement after DuoNeb. He is able to ambulate and maintain pulse ox greater than 93%. He is requesting to go home. We'll discharge her prednisone and an inhaler. He also reports that he has a nebulizer at home.  After history, exam, and medical workup I feel the patient has been appropriately medically screened and is safe for discharge home. Pertinent diagnoses were discussed with the patient. Patient was given return precautions.   Final Clinical Impressions(s) / ED Diagnoses   Final diagnoses:  COPD exacerbation (HCC)    New Prescriptions New Prescriptions   ALBUTEROL (PROVENTIL HFA;VENTOLIN HFA) 108 (90 BASE) MCG/ACT INHALER    Inhale 2 puffs into the lungs every 4 (four) hours as needed for wheezing or shortness of breath.   PREDNISONE (DELTASONE) 20 MG TABLET    Take 2 tablets (40 mg total) by mouth daily.     Shon BatonHorton, Alyne Martinson F, MD 01/19/17 [phone]

## 2017-01-19 NOTE — ED Notes (Signed)
Ambulated Pt in Hallway while on Pulse Ox without O2. Pt started on 96%. Pt stayed around 94% while walking down Pod D Hallway

## 2017-01-19 NOTE — ED Notes (Signed)
Attempted IV start x2.

## 2017-03-10 ENCOUNTER — Encounter (HOSPITAL_COMMUNITY): Payer: Self-pay | Admitting: Emergency Medicine

## 2017-03-10 ENCOUNTER — Emergency Department (HOSPITAL_COMMUNITY)
Admission: EM | Admit: 2017-03-10 | Discharge: 2017-03-11 | Disposition: A | Discharge status: Home / Self Care | Payer: Medicare Other | Attending: Emergency Medicine | Admitting: Emergency Medicine

## 2017-03-10 ENCOUNTER — Emergency Department (HOSPITAL_COMMUNITY): Payer: Medicare Other

## 2017-03-10 DIAGNOSIS — Z79899 Other long term (current) drug therapy: Secondary | ICD-10-CM | POA: Insufficient documentation

## 2017-03-10 DIAGNOSIS — R0602 Shortness of breath: Secondary | ICD-10-CM | POA: Diagnosis present

## 2017-03-10 DIAGNOSIS — Z87891 Personal history of nicotine dependence: Secondary | ICD-10-CM | POA: Diagnosis not present

## 2017-03-10 DIAGNOSIS — I110 Hypertensive heart disease with heart failure: Secondary | ICD-10-CM | POA: Diagnosis not present

## 2017-03-10 DIAGNOSIS — J449 Chronic obstructive pulmonary disease, unspecified: Secondary | ICD-10-CM | POA: Insufficient documentation

## 2017-03-10 DIAGNOSIS — I509 Heart failure, unspecified: Secondary | ICD-10-CM | POA: Diagnosis not present

## 2017-03-10 DIAGNOSIS — I2510 Atherosclerotic heart disease of native coronary artery without angina pectoris: Secondary | ICD-10-CM | POA: Insufficient documentation

## 2017-03-10 DIAGNOSIS — Z8673 Personal history of transient ischemic attack (TIA), and cerebral infarction without residual deficits: Secondary | ICD-10-CM | POA: Insufficient documentation

## 2017-03-10 LAB — CBC WITH DIFFERENTIAL/PLATELET
BASOS ABS: 0 10*3/uL (ref 0.0–0.1)
BASOS PCT: 0 %
EOS ABS: 0.1 10*3/uL (ref 0.0–0.7)
EOS PCT: 2 %
HEMATOCRIT: 36.5 % — AB (ref 39.0–52.0)
Hemoglobin: 11.6 g/dL — ABNORMAL LOW (ref 13.0–17.0)
Lymphocytes Relative: 27 %
Lymphs Abs: 2.1 10*3/uL (ref 0.7–4.0)
MCH: 26.6 pg (ref 26.0–34.0)
MCHC: 31.8 g/dL (ref 30.0–36.0)
MCV: 83.7 fL (ref 78.0–100.0)
MONO ABS: 0.7 10*3/uL (ref 0.1–1.0)
MONOS PCT: 9 %
NEUTROS ABS: 4.9 10*3/uL (ref 1.7–7.7)
Neutrophils Relative %: 62 %
PLATELETS: 320 10*3/uL (ref 150–400)
RBC: 4.36 MIL/uL (ref 4.22–5.81)
RDW: 15.1 % (ref 11.5–15.5)
WBC: 7.8 10*3/uL (ref 4.0–10.5)

## 2017-03-10 LAB — I-STAT TROPONIN, ED: TROPONIN I, POC: 0.02 ng/mL (ref 0.00–0.08)

## 2017-03-10 LAB — BASIC METABOLIC PANEL
ANION GAP: 10 (ref 5–15)
BUN: 14 mg/dL (ref 6–20)
CALCIUM: 8.7 mg/dL — AB (ref 8.9–10.3)
CO2: 22 mmol/L (ref 22–32)
CREATININE: 1.3 mg/dL — AB (ref 0.61–1.24)
Chloride: 106 mmol/L (ref 101–111)
GFR, EST NON AFRICAN AMERICAN: 57 mL/min — AB (ref >60)
Glucose, Bld: 101 mg/dL — ABNORMAL HIGH (ref 65–99)
Potassium: 3.5 mmol/L (ref 3.5–5.1)
SODIUM: 138 mmol/L (ref 135–145)

## 2017-03-10 LAB — BRAIN NATRIURETIC PEPTIDE: B NATRIURETIC PEPTIDE 5: 1243.3 pg/mL — AB (ref 0.0–100.0)

## 2017-03-10 MED ORDER — ALBUTEROL SULFATE (2.5 MG/3ML) 0.083% IN NEBU
5.0000 mg | INHALATION_SOLUTION | Freq: Once | RESPIRATORY_TRACT | Status: AC
  Administered 2017-03-10: 5 mg via RESPIRATORY_TRACT
  Filled 2017-03-10: qty 6

## 2017-03-10 MED ORDER — FUROSEMIDE 10 MG/ML IJ SOLN
40.0000 mg | INTRAMUSCULAR | Status: AC
  Administered 2017-03-10: 40 mg via INTRAVENOUS
  Filled 2017-03-10: qty 4

## 2017-03-10 MED ORDER — IPRATROPIUM BROMIDE 0.02 % IN SOLN
0.5000 mg | Freq: Once | RESPIRATORY_TRACT | Status: AC
  Administered 2017-03-10: 0.5 mg via RESPIRATORY_TRACT
  Filled 2017-03-10: qty 2.5

## 2017-03-10 MED ORDER — METHYLPREDNISOLONE SODIUM SUCC 125 MG IJ SOLR
125.0000 mg | Freq: Once | INTRAMUSCULAR | Status: AC
  Administered 2017-03-10: 125 mg via INTRAVENOUS
  Filled 2017-03-10: qty 2

## 2017-03-10 NOTE — ED Provider Notes (Signed)
[facility]   CSN: [id number] Arrival date & time: 03/10/17  2157     History   Chief Complaint Chief Complaint  Patient presents with  . Shortness of Breath    HPI [name] is a 66 y.o. male.  The history is provided by the patient and medical records.  Shortness of Breath  Associated symptoms include cough.    66 year old male with history of coronary artery disease, CHF, A. fib, COPD, history of cocaine abuse, hypertension, prior stroke, presenting to the ED with shortness of breath.  Patient reports she has been feeling short of breath for about 3 days now.  States symptoms seem to wax and wane.  States yesterday around 6 PM he started using his albuterol treatments at home almost repeatedly.  States he will experience some relief for about 25-30 minutes but then symptoms recur.  States he has had reductive cough with thick mucus.  He denies any hemoptysis.  Denies any fever or chills.  Patient states he is usually a smoker, however has not been smoking in the past few days because of his shortness of breath.  States he has not been eating as he states he has a poor appetite.  He denies any chest pain, but does report some "soreness" in the ribs from coughing.  Last EF 20-25% on echo 11/12/16.  Past Medical History:  Diagnosis Date  . Atrial fibrillation (HCC)   . CAD (coronary artery disease)    nonobstructive  . CHF (congestive heart failure) (HCC)   . Chronic systolic heart failure (HCC)   . COPD (chronic obstructive pulmonary disease) (HCC)   . Drug abuse (HCC)    cocaine  . Heartburn   . Hypertension   . Stroke (HCC)   . Substance abuse [facility](HCC)     Patient Active Problem List   Diagnosis Date Noted  . Severe protein-calorie malnutrition (HCC) 11/17/2016  . Hyperkalemia 11/17/2016  . Abdominal pain 11/16/2016  . Acute lower UTI 11/16/2016  . AKI (acute kidney injury) (HCC) 11/15/2016  . Hyperglycemia  11/15/2016  . Acute metabolic encephalopathy 11/15/2016  . Hyponatremia 11/15/2016  . Acute encephalopathy   . Acute on chronic heart failure (HCC) 11/10/2016  . Ventilator dependent (HCC)   . Substance abuse (HCC)   . Heartburn   . Hypertension   . Pulmonary edema   . Respiratory failure (HCC)   . Acute respiratory failure with hypercapnia (HCC) 08/06/2016  . Atrial fibrillation (HCC) 07/13/2016  . Chronic bronchitis (HCC) 07/13/2016  . Respiratory failure (HCC) 07/09/2016  . Pneumothorax on right   . 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 COPD exacerbation (HCC)   . History of CVA (cerebrovascular accident)   . Acute on chronic systolic heart failure (HCC) 07/08/2011  . Hypokalemia 07/07/2011  . Flash pulmonary edema (HCC) 07/06/2011  . 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 Abnormal EKG 07/06/2011  . Chronic systolic congestive heart failure (HCC) 05/05/2011  . Acute respiratory failure with hypoxia (HCC) 05/03/2011  . Polysubstance abuse (HCC) 03/25/2010  . Essential hypertension 03/25/2010    Past Surgical History:  Procedure Laterality Date  . JOINT REPLACEMENT  L knee       Home Medications    Prior to Admission medications   Medication Sig Start Date End Date Taking? Authorizing Provider  albuterol (PROVENTIL HFA;VENTOLIN HFA) 108 (90 Base) MCG/ACT inhaler Inhale 2 puffs into the lungs every 4 (four) hours as needed for wheezing or shortness  of breath. 01/19/17   Horton, Mayer Masker, MD  azithromycin (ZITHROMAX) 250 MG tablet Take 1 pill daily starting on 8/29. 01/06/17   Zigmund Daniel., MD  feeding supplement, ENSURE ENLIVE, (ENSURE ENLIVE) LIQD Take 237 mLs by mouth 2 (two) times daily between meals. 01/05/17   Zigmund Daniel., MD  furosemide (LASIX) 40 MG tablet Take 1 tablet (40 mg total) by mouth daily. 11/19/16   Hongalgi, Maximino Greenland, MD  predniSONE (DELTASONE) 20 MG tablet Take 2 tablets (40 mg total) by mouth daily. 01/19/17    Horton, Mayer Masker, MD  Rivaroxaban (XARELTO) 15 MG TABS tablet Take 1 tablet (15 mg total) by mouth daily. 01/05/17   Zigmund Daniel., MD    Family History Family History  Problem Relation Age of Onset  . Malignant hyperthermia Mother     Social History Social History  Substance Use Topics  . Smoking status: Former Smoker    Types: Cigarettes    Quit date: 09/03/2016  . Smokeless tobacco: Never Used  . Alcohol use Yes     Allergies   Patient has no known allergies.   Review of Systems Review of Systems  Respiratory: Positive for cough and shortness of breath.   All other systems reviewed and are negative.    Physical Exam Updated Vital Signs BP 105/65 (BP Location: Left Arm)   Pulse 96   Temp 97.8 F (36.6 C) (Oral)   Resp (!) 22   Ht 6\' 1"  (1.854 m)   Wt 59 kg (130 lb)   SpO2 100%   BMI 17.15 kg/m   Physical Exam  Constitutional: He is oriented to person, place, and time. He appears well-developed and well-nourished.  HENT:  Head: Normocephalic and atraumatic.  Mouth/Throat: Oropharynx is clear and moist.  Eyes: Pupils are equal, round, and reactive to light. Conjunctivae and EOM are normal.  Neck: Normal range of motion.  Cardiovascular: Normal rate, regular rhythm and normal heart sounds.   Pulmonary/Chest: Effort normal and breath sounds normal.  Slightly increased work of breathing, able to speak in short, truncated sentences, breath sounds junky at bases  Abdominal: Soft. Bowel sounds are normal.  Musculoskeletal: Normal range of motion.  Neurological: He is alert and oriented to person, place, and time.  Skin: Skin is warm and dry.  Psychiatric: He has a normal mood and affect.  Nursing note and vitals reviewed.    ED Treatments / Results  Labs (all labs ordered are listed, but only abnormal results are displayed) Labs Reviewed  CBC WITH DIFFERENTIAL/PLATELET - Abnormal; Notable for the following:       Result Value   Hemoglobin 11.6  (*)    HCT 36.5 (*)    All other components within normal limits  BASIC METABOLIC PANEL - Abnormal; Notable for the following:    Glucose, Bld 101 (*)    Creatinine, Ser 1.30 (*)    Calcium 8.7 (*)    GFR calc non Af Amer 57 (*)    All other components within normal limits  BRAIN NATRIURETIC PEPTIDE - Abnormal; Notable for the following:    B Natriuretic Peptide 1,243.3 (*)    All other components within normal limits  I-STAT TROPONIN, ED    EKG  EKG Interpretation  Date/Time:  Wednesday March 10 2017 22:12:12 EDT Ventricular Rate:  98 PR Interval:  158 QRS Duration: 168 QT Interval:  432 QTC Calculation: 551 R Axis:   170 Text Interpretation:  Sinus rhythm with  frequent Premature ventricular complexes Possible Left atrial enlargement Right bundle branch block Left posterior fascicular block Inferior infarct , age undetermined Anterior infarct , age undetermined Abnormal ECG No acute changes No significant change since last tracing Confirmed by Derwood Kaplan [phone]) on 03/11/2017 1:14:37 AM       Radiology Dg Chest 2 View  Result Date: 03/10/2017 CLINICAL DATA:  Shortness of breath EXAM: CHEST  2 VIEW COMPARISON:  Chest radiograph 01/19/2017 FINDINGS: The lungs are hyperexpanded. Fall arm There are persistent diffuse interstitial opacities, slightly decreased from the prior study. Cardiomediastinal contours are normal. No pneumothorax or pleural effusion. No focal airspace consolidation. IMPRESSION: Diffuse interstitial opacities likely indicating interstitial pulmonary edema. COPD. Electronically Signed   By: Deatra Robinson M.D.   On: 03/10/2017 22:57    Procedures Procedures (including critical care time)  Medications Ordered in ED Medications  methylPREDNISolone sodium succinate (SOLU-MEDROL) 125 mg/2 mL injection 125 mg (not administered)  albuterol (PROVENTIL) (2.5 MG/3ML) 0.083% nebulizer solution 5 mg (not administered)  ipratropium (ATROVENT) nebulizer solution  0.5 mg (not administered)     Initial Impression / Assessment and Plan / ED Course  I have reviewed the triage vital signs and the nursing notes.  Pertinent labs & imaging results that were available during my care of the patient were reviewed by me and considered in my medical decision making (see chart for details).  66 year old male here with shortness of breath.  Reports ongoing for 3 days.  Does endorse productive cough and "soreness" in the ribs.  He is afebrile, nontoxic.  He does have increased work of breathing and is speaking in short, truncated sentences.  Breath sounds are junky, worse at the bases.  Does have significant history of COPD requiring multiple admissions and intubation in the past.  We will plan for chest x-ray, screening labs, IV Solu-Medrol and nebs ordered.  EKG unchanged from prior.  Will monitor closely.    Workup thus far more consistent with CHF picture.  Patient states he is feeling better after some nebs.  Reports he has been taking his Lasix as prescribed.  His BNP is 1200+, however this seems around his baseline when compared with prior values.  Patient overall appears improved, currently eating [place] sandwich in NAD.  Will give dose of IV lasix here and ambulate with pulse ox.  If maintaining appropriate sats, can likely be treated as an OP.  Patient is agreeable to this, strongly desiring to go home today.   Patient able to ambulate and maintain sats >96%.  He has been able to eat full meal here, reports he is feeling better.  NAD.  Feel he is appropirate for OP management.  Will have him increase his home lasix to 60mg  daily for the next 3 days, then resume back to his daily 40mg  dosing.  He was instructed to follow-up closely with his PCP.  Discussed plan with patient, he acknowledged understanding and agreed with plan of care.  Return precautions given for new or worsening symptoms.   Final Clinical Impressions(s) / ED Diagnoses   Final diagnoses:  SOB  (shortness of breath)  Acute congestive heart failure, unspecified heart failure type [facility])    New Prescriptions New Prescriptions   No medications on file     Garlon Hatchet, PA-C 03/11/17 0327    Ward, Layla Maw, DO 03/11/17 [phone]

## 2017-03-10 NOTE — ED Triage Notes (Signed)
Per EMS, pt from home with shortness of breath x 3 days. Home albuterol treatments have not helped. Given 5mg  albuterol and 0.5 atrovent PTA. EMS vitals: BP-127/98, HR-100, CBG-114, SpO2-95% room air.

## 2017-03-11 NOTE — Discharge Instructions (Signed)
Increase your home lasix to 60mg  (1.5 tablets) for the next 3 days, then resume your usual 40mg  daily dosing. Can continue to use your inhalers if you feel like you need them. Follow-up with your primary care doctor. Return here for any new/worsening symptoms.

## 2017-03-11 NOTE — ED Notes (Addendum)
Patient was ambulated on room air, maintained 96-97% SpO2

## 2017-04-10 DEATH — deceased

## 2018-11-22 IMAGING — DX DG CHEST 1V PORT
1 series · 1 of 1 positions shown · non-contrast
Comparison: 07/09/2016

CLINICAL DATA: ET tube placement

EXAM:
PORTABLE CHEST 1 VIEW

[chest ap]
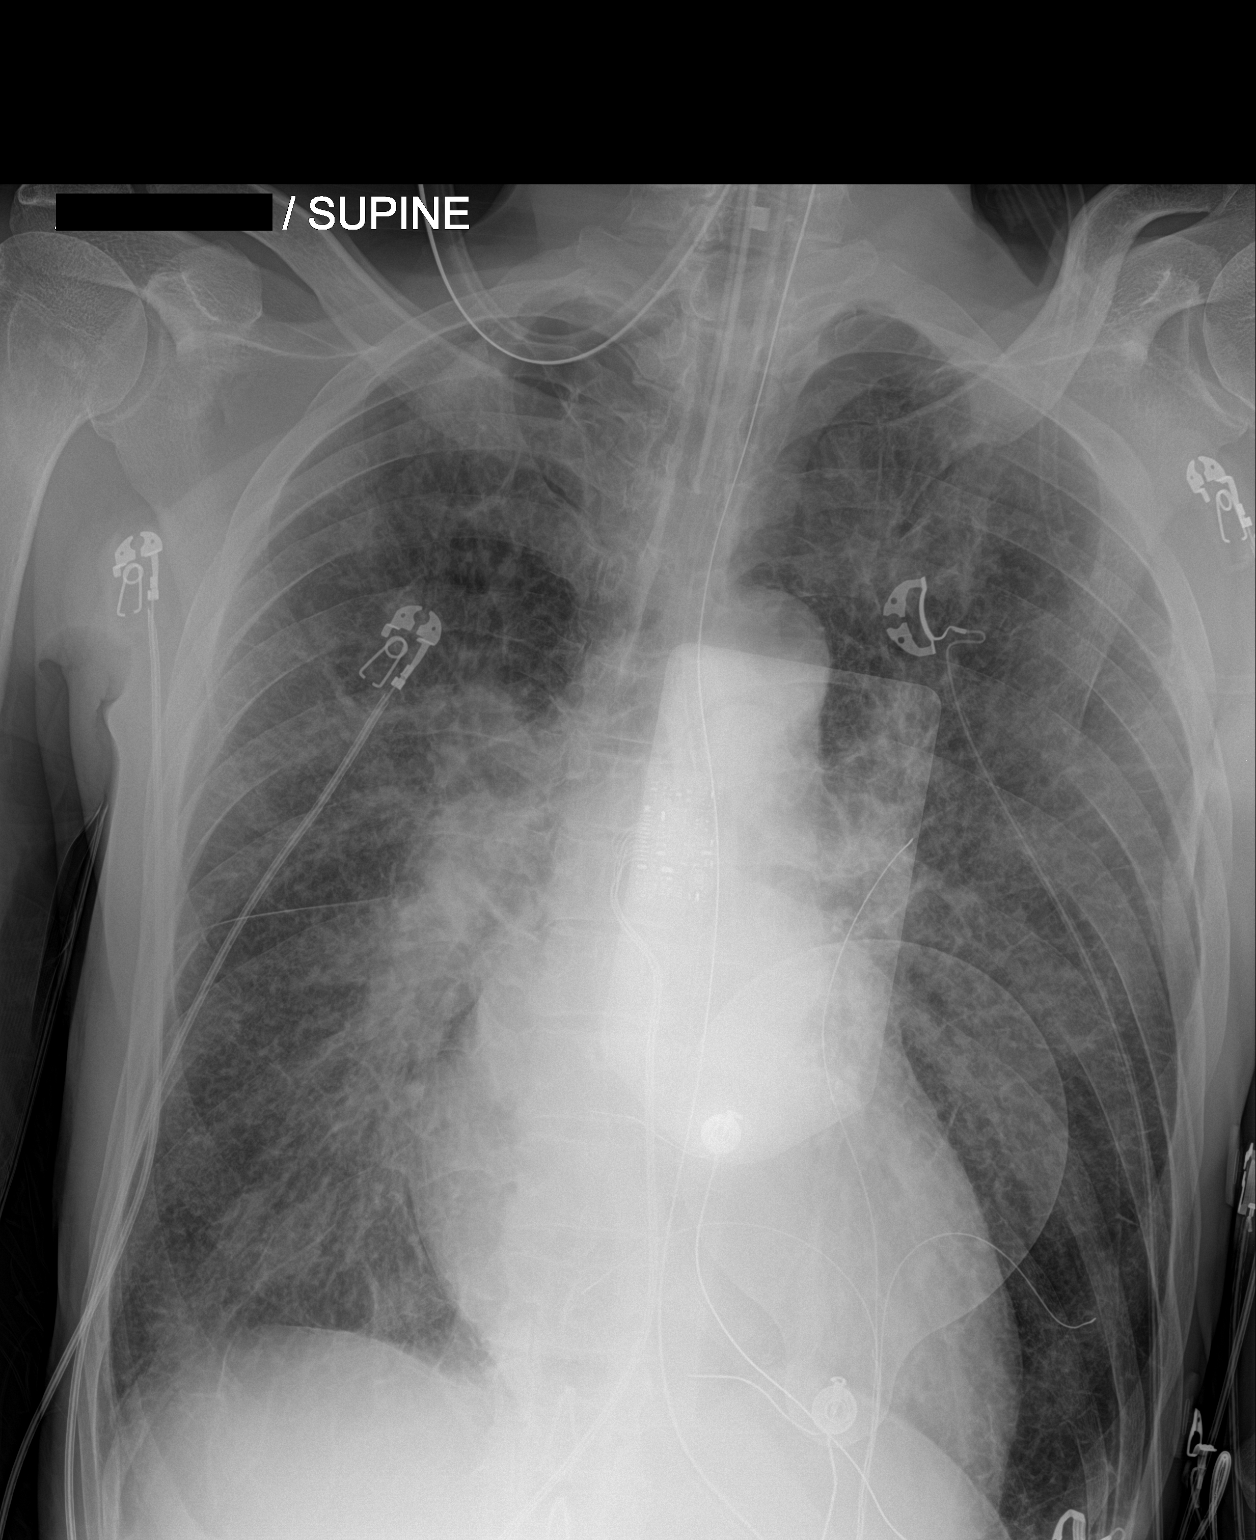

[1 of 1 positions shown; findings below may reference images not displayed]

FINDINGS: Endotracheal tube with the tip 6.8 cm above the carina. Nasogastric
tube coursing below the diaphragm.

Bilateral diffuse interstitial thickening. No pleural effusion or
pneumothorax. Stable cardiomegaly.

The osseous structures are unremarkable.
IMPRESSION: 1. Endotracheal tube with the tip 6.8 cm above the carina.

## 2018-11-22 IMAGING — DX DG CHEST 1V PORT
1 series · 1 of 1 positions shown · non-contrast
Comparison: 02/01/2016

CLINICAL DATA: Restrained distress

EXAM:
PORTABLE CHEST 1 VIEW

[chest ap]
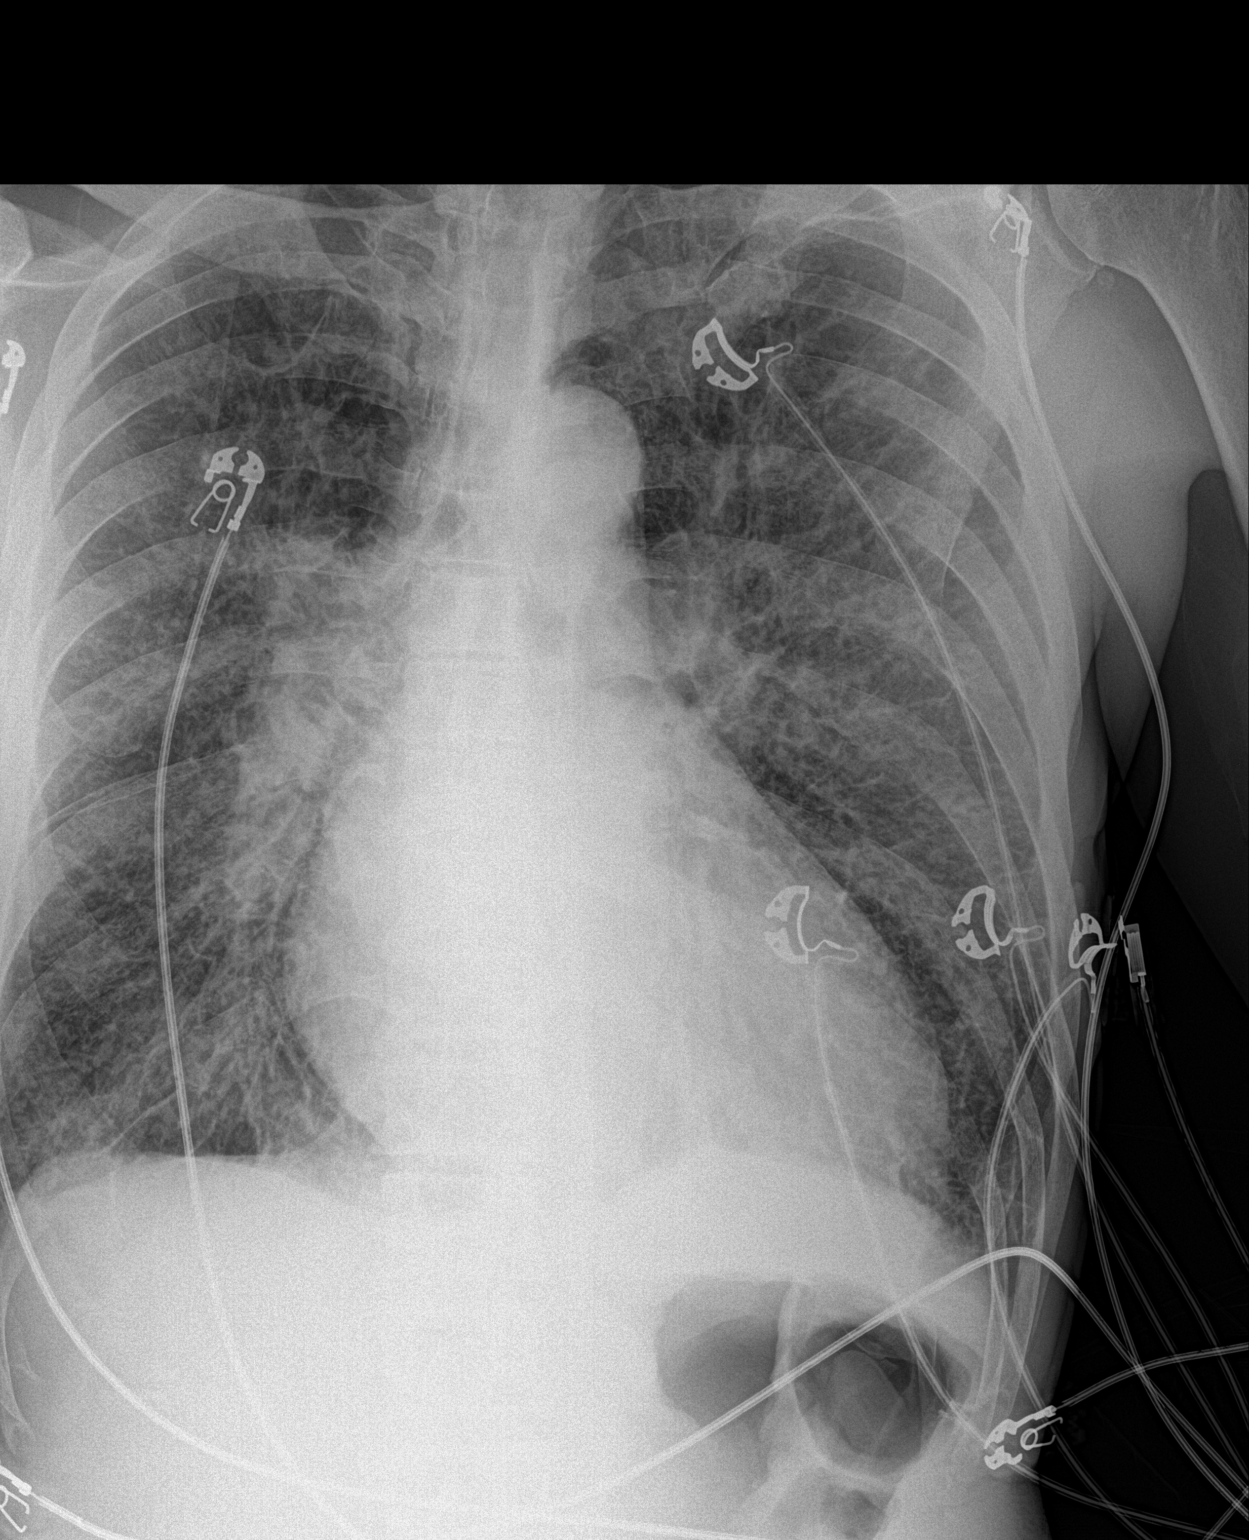

[1 of 1 positions shown; findings below may reference images not displayed]

FINDINGS: There is bilateral diffuse interstitial thickening. There is no
focal consolidation, pleural effusion or pneumothorax. There is
stable cardiomegaly. There is no acute osseous abnormality
IMPRESSION: Findings concerning for mild pulmonary edema.
